# Patient Record
Sex: Female | Born: 1935
Health system: Southern US, Community
[De-identification: ages and names within clinical notes are randomized; demographics above are authoritative.]

## PROBLEM LIST (undated history)

## (undated) DIAGNOSIS — I2510 Atherosclerotic heart disease of native coronary artery without angina pectoris: Secondary | ICD-10-CM

## (undated) HISTORY — DX: Atherosclerotic heart disease of native coronary artery without angina pectoris: I25.10

---

## 2007-02-10 HISTORY — PX: CORONARY ARTERY BYPASS GRAFT: SHX141

## 2007-12-27 ENCOUNTER — Inpatient Hospital Stay: Payer: Self-pay | Admitting: Internal Medicine

## 2010-09-22 ENCOUNTER — Inpatient Hospital Stay: Payer: Self-pay | Admitting: Internal Medicine

## 2011-06-22 ENCOUNTER — Ambulatory Visit (INDEPENDENT_AMBULATORY_CARE_PROVIDER_SITE_OTHER): Payer: Medicare Other | Admitting: Internal Medicine

## 2011-06-22 ENCOUNTER — Encounter: Payer: Self-pay | Admitting: Internal Medicine

## 2011-06-22 VITALS — BP 179/64 | HR 58 | Temp 98.0°F | Resp 14 | Ht 60.0 in | Wt 98.0 lb

## 2011-06-22 DIAGNOSIS — D649 Anemia, unspecified: Secondary | ICD-10-CM

## 2011-06-22 DIAGNOSIS — I2510 Atherosclerotic heart disease of native coronary artery without angina pectoris: Secondary | ICD-10-CM | POA: Insufficient documentation

## 2011-06-22 NOTE — Progress Notes (Signed)
Subjective:    Patient ID: Madison Brown, female    DOB: March 21, 1929, 78 y.o.   MRN: [id number]  HPI 78 year old female with history of coronary artery disease presents to establish care. She reports that she is generally feeling well. She has no complaints today. In regards to her coronary artery disease, she reports that she underwent coronary artery bypass grafting in 2009. She continues to have some discomfort over her mid sternum at the incision site. However, she denies any chest pain, shortness of breath, palpitations. She reports full compliance with her medications. She reports that her cardiologist had stopped her statin medication because of her age. She reports that she follows a healthy diet which is low in saturated fat and high in fiber. She also exercises regularly by walking 2 miles per day.  Outpatient Encounter Prescriptions as of 06/22/2011  Medication Sig Dispense Refill  . AMLODIPINE BESYLATE PO Take 10 mg by mouth daily.      . clopidogrel (PLAVIX) 75 MG tablet Take 75 mg by mouth daily.      . metoprolol (LOPRESSOR) 50 MG tablet Take 50 mg by mouth 2 (two) times daily.        Review of Systems  Constitutional: Negative for fever, chills, appetite change, fatigue and unexpected weight change.  HENT: Negative for ear pain, congestion, sore throat, trouble swallowing, neck pain, voice change and sinus pressure.   Eyes: Negative for visual disturbance.  Respiratory: Negative for cough, shortness of breath, wheezing and stridor.   Cardiovascular: Negative for chest pain, palpitations and leg swelling.  Gastrointestinal: Negative for nausea, vomiting, abdominal pain, diarrhea, constipation, blood in stool, abdominal distention and anal bleeding.  Genitourinary: Negative for dysuria and flank pain.  Musculoskeletal: Negative for myalgias, arthralgias and gait problem.  Skin: Negative for color change and rash.  Neurological: Negative for dizziness and headaches.  Hematological:  Negative for adenopathy. Does not bruise/bleed easily.  Psychiatric/Behavioral: Negative for suicidal ideas, sleep disturbance and dysphoric mood. The patient is not nervous/anxious.    BP 179/64  Pulse 58  Temp(Src) 98 F (36.7 C) (Oral)  Resp 14  Ht 5' (1.524 m)  Wt 98 lb (44.453 kg)  BMI 19.14 kg/m2  SpO2 96%     Objective:   Physical Exam  Constitutional: She is oriented to person, place, and time. She appears well-developed and well-nourished. No distress.  HENT:  Head: Normocephalic and atraumatic.  Right Ear: External ear normal.  Left Ear: External ear normal.  Nose: Nose normal.  Mouth/Throat: Oropharynx is clear and moist. No oropharyngeal exudate.  Eyes: Conjunctivae are normal. Pupils are equal, round, and reactive to light. Right eye exhibits no discharge. Left eye exhibits no discharge. No scleral icterus.  Neck: Normal range of motion. Neck supple. No tracheal deviation present. No thyromegaly present.  Cardiovascular: Normal rate, regular rhythm, normal heart sounds and intact distal pulses.  Exam reveals no gallop and no friction rub.   No murmur heard. Pulmonary/Chest: Effort normal and breath sounds normal. No respiratory distress. She has no wheezes. She has no rales. She exhibits no tenderness.    Abdominal: Soft. Bowel sounds are normal. She exhibits no distension and no mass. There is no tenderness. There is no guarding.  Musculoskeletal: Normal range of motion. She exhibits no edema and no tenderness.  Lymphadenopathy:    She has no cervical adenopathy.  Neurological: She is alert and oriented to person, place, and time. No cranial nerve deficit. She exhibits normal muscle tone. Coordination normal.  Skin: Skin is warm and dry. No rash noted. She is not diaphoretic. No erythema. No pallor.  Psychiatric: She has a normal mood and affect. Her behavior is normal. Judgment and thought content normal.          Assessment & Plan:

## 2011-06-22 NOTE — Assessment & Plan Note (Signed)
Symptomatically doing well. Will obtain previous records from [facility] and her local cardiologist. Will continue current medications including metoprolol, amlodipine, and Plavix. Will review records to see why patient is not currently taking aspirin or statin medication. Will check CMP and lipids with labs today. Followup in 6 months or as needed.

## 2011-06-23 LAB — CBC WITH DIFFERENTIAL/PLATELET
Basophils Absolute: 0.1 10*3/uL (ref 0.0–0.1)
Eosinophils Absolute: 0.2 10*3/uL (ref 0.0–0.7)
HCT: 36.3 % (ref 36.0–46.0)
Hemoglobin: 12 g/dL (ref 12.0–15.0)
Lymphocytes Relative: 25.8 % (ref 12.0–46.0)
Lymphs Abs: 1.8 10*3/uL (ref 0.7–4.0)
MCHC: 33 g/dL (ref 30.0–36.0)
MCV: 93.7 fl (ref 78.0–100.0)
Monocytes Absolute: 0.6 10*3/uL (ref 0.1–1.0)
Neutro Abs: 4.3 10*3/uL (ref 1.4–7.7)
RDW: 13.3 % (ref 11.5–14.6)

## 2011-06-23 LAB — LIPID PANEL
Cholesterol: 204 mg/dL — ABNORMAL HIGH (ref 0–200)
VLDL: 23.4 mg/dL (ref 0.0–40.0)

## 2011-06-23 LAB — COMPREHENSIVE METABOLIC PANEL
CO2: 27 mEq/L (ref 19–32)
Calcium: 9.3 mg/dL (ref 8.4–10.5)
Creatinine, Ser: 1.2 mg/dL (ref 0.4–1.2)
GFR: 45.92 mL/min — ABNORMAL LOW (ref >60.00)
Glucose, Bld: 108 mg/dL — ABNORMAL HIGH (ref 70–99)
Sodium: 141 mEq/L (ref 135–145)
Total Bilirubin: 0.3 mg/dL (ref 0.3–1.2)
Total Protein: 8 g/dL (ref 6.0–8.3)

## 2011-12-23 ENCOUNTER — Ambulatory Visit: Payer: Medicare Other | Admitting: Internal Medicine

## 2011-12-31 ENCOUNTER — Ambulatory Visit (INDEPENDENT_AMBULATORY_CARE_PROVIDER_SITE_OTHER): Payer: Medicare Other | Admitting: Internal Medicine

## 2011-12-31 ENCOUNTER — Encounter: Payer: Self-pay | Admitting: Internal Medicine

## 2011-12-31 VITALS — BP 138/70 | HR 60 | Temp 97.9°F | Resp 16 | Wt 99.2 lb

## 2011-12-31 DIAGNOSIS — D649 Anemia, unspecified: Secondary | ICD-10-CM

## 2011-12-31 DIAGNOSIS — Z888 Allergy status to other drugs, medicaments and biological substances status: Secondary | ICD-10-CM

## 2011-12-31 DIAGNOSIS — Z139 Encounter for screening, unspecified: Secondary | ICD-10-CM

## 2011-12-31 DIAGNOSIS — Z789 Other specified health status: Secondary | ICD-10-CM | POA: Insufficient documentation

## 2011-12-31 DIAGNOSIS — E039 Hypothyroidism, unspecified: Secondary | ICD-10-CM

## 2011-12-31 DIAGNOSIS — I2510 Atherosclerotic heart disease of native coronary artery without angina pectoris: Secondary | ICD-10-CM

## 2011-12-31 LAB — COMPREHENSIVE METABOLIC PANEL
AST: 26 U/L (ref 0–37)
BUN: 20 mg/dL (ref 6–23)
Calcium: 9.3 mg/dL (ref 8.4–10.5)
Chloride: 101 mEq/L (ref 96–112)
Creatinine, Ser: 1.2 mg/dL (ref 0.4–1.2)
GFR: 44.99 mL/min — ABNORMAL LOW (ref >60.00)

## 2011-12-31 LAB — CBC WITH DIFFERENTIAL/PLATELET
Basophils Relative: 0.3 % (ref 0.0–3.0)
Hemoglobin: 12.7 g/dL (ref 12.0–15.0)
Lymphocytes Relative: 26 % (ref 12.0–46.0)
MCHC: 33.5 g/dL (ref 30.0–36.0)
Monocytes Relative: 8.6 % (ref 3.0–12.0)
Neutro Abs: 4.8 10*3/uL (ref 1.4–7.7)
RBC: 4.07 Mil/uL (ref 3.87–5.11)

## 2011-12-31 LAB — POCT URINALYSIS DIPSTICK
Leukocytes, UA: NEGATIVE
Protein, UA: NEGATIVE
Urobilinogen, UA: 0.2

## 2011-12-31 LAB — LIPID PANEL
HDL: 44.7 mg/dL (ref >39.00)
Total CHOL/HDL Ratio: 5
VLDL: 27 mg/dL (ref 0.0–40.0)

## 2011-12-31 LAB — LDL CHOLESTEROL, DIRECT: Direct LDL: 135.5 mg/dL

## 2011-12-31 NOTE — Assessment & Plan Note (Signed)
Symptomatically doing well with no recent  Chest pain or dyspnea. Encouraged  her to continue efforts at regular physical activity including walking 2 miles daily.We'll continue current medications. Will get recent notes from her cardiologist. Followup in 6 months or sooner as needed.

## 2011-12-31 NOTE — Progress Notes (Signed)
  Subjective:    Patient ID: Madison Brown, female    DOB: 11-18-1929, 78 y.o.   MRN: [id number]  HPI 78 year old female with history of coronary artery disease status post CABG presents for followup. She reports she is generally feeling well. She continues to be very active, walking 2 miles per day. She notes some discomfort at the incision site from her CABG which has been chronic. She denies any chest pain, shortness of breath, nausea, diaphoresis. She reports normal energy level. She denies new concerns today.  Outpatient Encounter Prescriptions as of 12/31/2011  Medication Sig Dispense Refill  . AMLODIPINE BESYLATE PO Take 10 mg by mouth daily.      . clopidogrel (PLAVIX) 75 MG tablet Take 75 mg by mouth daily.      . metoprolol (LOPRESSOR) 50 MG tablet Take 50 mg by mouth 2 (two) times daily.       BP 138/70  Pulse 60  Temp 97.9 F (36.6 C) (Oral)  Resp 16  Wt 99 lb 4 oz (45.02 kg)  SpO2 98%  Review of Systems  Constitutional: Negative for fever, chills, appetite change, fatigue and unexpected weight change.  HENT: Negative for ear pain, congestion, sore throat, trouble swallowing, neck pain, voice change and sinus pressure.   Eyes: Negative for visual disturbance.  Respiratory: Negative for cough, shortness of breath, wheezing and stridor.   Cardiovascular: Negative for chest pain, palpitations and leg swelling.  Gastrointestinal: Negative for nausea, vomiting, abdominal pain, diarrhea, constipation, blood in stool, abdominal distention and anal bleeding.  Genitourinary: Negative for dysuria and flank pain.  Musculoskeletal: Negative for myalgias, arthralgias and gait problem.  Skin: Negative for color change and rash.  Neurological: Negative for dizziness and headaches.  Hematological: Negative for adenopathy. Does not bruise/bleed easily.  Psychiatric/Behavioral: Negative for suicidal ideas, sleep disturbance and dysphoric mood. The patient is not nervous/anxious.          Objective:   Physical Exam  Constitutional: She is oriented to person, place, and time. She appears well-developed and well-nourished. No distress.  HENT:  Head: Normocephalic and atraumatic.  Right Ear: External ear normal.  Left Ear: External ear normal.  Nose: Nose normal.  Mouth/Throat: Oropharynx is clear and moist. No oropharyngeal exudate.  Eyes: Conjunctivae normal are normal. Pupils are equal, round, and reactive to light. Right eye exhibits no discharge. Left eye exhibits no discharge. No scleral icterus.  Neck: Normal range of motion. Neck supple. No tracheal deviation present. No thyromegaly present.  Cardiovascular: Normal rate, regular rhythm, normal heart sounds and intact distal pulses.  Exam reveals no gallop and no friction rub.   No murmur heard. Pulmonary/Chest: Effort normal and breath sounds normal. No respiratory distress. She has no wheezes. She has no rales. She exhibits no tenderness.  Abdominal: Soft. Bowel sounds are normal. She exhibits no distension and no mass. There is no tenderness. There is no rebound and no guarding.  Musculoskeletal: Normal range of motion. She exhibits no edema and no tenderness.  Lymphadenopathy:    She has no cervical adenopathy.  Neurological: She is alert and oriented to person, place, and time. No cranial nerve deficit. She exhibits normal muscle tone. Coordination normal.  Skin: Skin is warm and dry. No rash noted. She is not diaphoretic. No erythema. No pallor.  Psychiatric: She has a normal mood and affect. Her behavior is normal. Judgment and thought content normal.          Assessment & Plan:

## 2011-12-31 NOTE — Assessment & Plan Note (Signed)
Note that patient was taken off statin medications by her cardiologist because of memory loss on these medications.

## 2012-10-06 ENCOUNTER — Ambulatory Visit (INDEPENDENT_AMBULATORY_CARE_PROVIDER_SITE_OTHER): Payer: Self-pay | Admitting: Internal Medicine

## 2012-10-06 ENCOUNTER — Encounter: Payer: Self-pay | Admitting: Internal Medicine

## 2012-10-06 VITALS — BP 128/78 | HR 59 | Temp 98.3°F | Ht 60.0 in | Wt 96.0 lb

## 2012-10-06 DIAGNOSIS — Z0000 Encounter for general adult medical examination without abnormal findings: Secondary | ICD-10-CM

## 2012-10-06 DIAGNOSIS — I2510 Atherosclerotic heart disease of native coronary artery without angina pectoris: Secondary | ICD-10-CM

## 2012-10-06 LAB — MICROALBUMIN / CREATININE URINE RATIO
Creatinine,U: 124.9 mg/dL
Microalb Creat Ratio: 1.8 mg/g (ref 0.0–30.0)

## 2012-10-06 NOTE — Assessment & Plan Note (Signed)
Symptomatically doing well. Encouraged continued healthy diet and regular exercise with walking. Continue current medications. Follow up with Dr. Gwen Pounds in 11/2012.

## 2012-10-06 NOTE — Assessment & Plan Note (Signed)
General medical exam normal today including breast exam. PAP and pelvic deferred because of age and pt preference. Pt declines colonoscopy and bone density testing and mammogram. Encouraged continued healthy diet and regular exercise with walking. Will check CMP, lipids, urine microalbumin today. She has follow up with her cardiologist, Dr. Gwen Pounds, in October 2014.

## 2012-10-06 NOTE — Progress Notes (Signed)
Subjective:    Patient ID: Madison Brown, female    DOB: November 03, 1929, 79 y.o.   MRN: [id number]  HPI The patient is here for annual Medicare wellness examination and management of other chronic and acute problems.   The risk factors are reflected in the social history.  The roster of all physicians providing medical care to patient - is listed in the Snapshot section of the chart.  Activities of daily living:  The patient is 100% independent in all ADLs: dressing, toileting, feeding as well as independent mobility  Home safety : The patient has smoke detectors in the home. They wear seatbelts.  There are no firearms at home. There is no violence in the home.   There is no risks for hepatitis, STDs or HIV. There is no history of blood transfusion (unsure during CABG). They have no travel history to infectious disease endemic areas of the world.  The patient has not seen their dentist in the last six month. Planning to set this up. They have seen their eye doctor in the last year. Followed by Dr. Alvester Morin. No issues with hearing.  They have deferred audiologic testing in the last year.   They do not  have excessive sun exposure. Discussed the need for sun protection: hats, long sleeves and use of sunscreen if there is significant sun exposure. No dermatologist.  Diet: the importance of a healthy diet is discussed. They do have a healthy diet.  The benefits of regular aerobic exercise were discussed. She walks 2 miles daily.  Depression screen: there are no signs or vegative symptoms of depression- irritability, change in appetite, anhedonia, sadness/tearfullness.  Cognitive assessment: the patient manages all their financial and personal affairs and is actively engaged. They could relate day,date,year and events.   The following portions of the patient's history were reviewed and updated as appropriate: allergies, current medications, past family history, past medical history,  past surgical  history, past social history  and problem list.  Visual acuity was not assessed per patient preference since she has regular follow up with her ophthalmologist. Hearing and body mass index were assessed and reviewed.   During the course of the visit the patient was educated and counseled about appropriate screening and preventive services including : fall prevention , diabetes screening, nutrition counseling, colorectal cancer screening, and recommended immunizations.    Outpatient Encounter Prescriptions as of 10/06/2012  Medication Sig Dispense Refill  . AMLODIPINE BESYLATE PO Take 10 mg by mouth daily.      . clopidogrel (PLAVIX) 75 MG tablet Take 75 mg by mouth daily.      . metoprolol tartrate (LOPRESSOR) 25 MG tablet Take 25 mg by mouth 2 (two) times daily.      . pravastatin (PRAVACHOL) 20 MG tablet Take 20 mg by mouth daily.       . metoprolol (LOPRESSOR) 50 MG tablet Take 50 mg by mouth 2 (two) times daily.       No facility-administered encounter medications on file as of 10/06/2012.   BP 160/80  Pulse 59  Temp(Src) 98.3 F (36.8 C) (Oral)  Ht 5' (1.524 m)  Wt 96 lb (43.545 kg)  BMI 18.75 kg/m2  SpO2 98%   Review of Systems  Constitutional: Negative for fever, chills, appetite change, fatigue and unexpected weight change.  HENT: Negative for ear pain, congestion, sore throat, trouble swallowing, neck pain, voice change and sinus pressure.   Eyes: Negative for visual disturbance.  Respiratory: Negative for cough, shortness of breath,  wheezing and stridor.   Cardiovascular: Negative for chest pain, palpitations and leg swelling.  Gastrointestinal: Negative for nausea, vomiting, abdominal pain, diarrhea, constipation, blood in stool, abdominal distention and anal bleeding.  Genitourinary: Negative for dysuria and flank pain.  Musculoskeletal: Negative for myalgias, arthralgias and gait problem.  Skin: Negative for color change and rash.  Neurological: Negative for dizziness  and headaches.  Hematological: Negative for adenopathy. Does not bruise/bleed easily.  Psychiatric/Behavioral: Negative for suicidal ideas, sleep disturbance and dysphoric mood. The patient is not nervous/anxious.        Objective:   Physical Exam  Constitutional: She is oriented to person, place, and time. She appears well-developed and well-nourished. No distress.  HENT:  Head: Normocephalic and atraumatic.  Right Ear: External ear normal.  Left Ear: External ear normal.  Nose: Nose normal.  Mouth/Throat: Oropharynx is clear and moist. No oropharyngeal exudate.  Eyes: Conjunctivae are normal. Pupils are equal, round, and reactive to light. Right eye exhibits no discharge. Left eye exhibits no discharge. No scleral icterus.  Neck: Normal range of motion. Neck supple. No tracheal deviation present. No thyromegaly present.  Cardiovascular: Normal rate, regular rhythm, normal heart sounds and intact distal pulses.  Exam reveals no gallop and no friction rub.   No murmur heard. Pulmonary/Chest: Effort normal and breath sounds normal. No accessory muscle usage. Not tachypneic. No respiratory distress. She has no decreased breath sounds. She has no wheezes. She has no rhonchi. She has no rales. She exhibits no tenderness. Right breast exhibits no inverted nipple, no mass, no nipple discharge, no skin change and no tenderness. Left breast exhibits no inverted nipple, no mass, no nipple discharge, no skin change and no tenderness. Breasts are symmetrical.    Musculoskeletal: Normal range of motion. She exhibits no edema and no tenderness.  Lymphadenopathy:    She has no cervical adenopathy.  Neurological: She is alert and oriented to person, place, and time. No cranial nerve deficit. She exhibits normal muscle tone. Coordination normal.  Skin: Skin is warm and dry. No rash noted. She is not diaphoretic. No erythema. No pallor.  Psychiatric: She has a normal mood and affect. Her behavior is  normal. Judgment and thought content normal.          Assessment & Plan:

## 2012-10-07 ENCOUNTER — Encounter: Payer: Self-pay | Admitting: *Deleted

## 2012-10-07 LAB — COMPREHENSIVE METABOLIC PANEL
ALT: 16 U/L (ref 0–35)
AST: 23 U/L (ref 0–37)
Creatinine, Ser: 1.2 mg/dL (ref 0.4–1.2)
GFR: 44.49 mL/min — ABNORMAL LOW (ref >60.00)
Total Bilirubin: 0.5 mg/dL (ref 0.3–1.2)

## 2012-10-07 LAB — LIPID PANEL
Cholesterol: 169 mg/dL (ref 0–200)
HDL: 51.9 mg/dL (ref >39.00)
VLDL: 19.8 mg/dL (ref 0.0–40.0)

## 2013-03-30 ENCOUNTER — Encounter: Payer: Self-pay | Admitting: Internal Medicine

## 2013-03-30 ENCOUNTER — Encounter (INDEPENDENT_AMBULATORY_CARE_PROVIDER_SITE_OTHER): Payer: Self-pay

## 2013-03-30 ENCOUNTER — Ambulatory Visit (INDEPENDENT_AMBULATORY_CARE_PROVIDER_SITE_OTHER): Payer: Medicare Other | Admitting: Internal Medicine

## 2013-03-30 VITALS — BP 110/68 | HR 60 | Temp 98.1°F | Wt 99.0 lb

## 2013-03-30 DIAGNOSIS — E785 Hyperlipidemia, unspecified: Secondary | ICD-10-CM

## 2013-03-30 DIAGNOSIS — I10 Essential (primary) hypertension: Secondary | ICD-10-CM

## 2013-03-30 DIAGNOSIS — I2510 Atherosclerotic heart disease of native coronary artery without angina pectoris: Secondary | ICD-10-CM

## 2013-03-30 LAB — COMPREHENSIVE METABOLIC PANEL
ALBUMIN: 4.1 g/dL (ref 3.5–5.2)
ALT: 15 U/L (ref 0–35)
AST: 22 U/L (ref 0–37)
Alkaline Phosphatase: 70 U/L (ref 39–117)
BUN: 16 mg/dL (ref 6–23)
CO2: 28 meq/L (ref 19–32)
Calcium: 9.4 mg/dL (ref 8.4–10.5)
Chloride: 102 mEq/L (ref 96–112)
Creatinine, Ser: 1.1 mg/dL (ref 0.4–1.2)
GFR: 49 mL/min — AB (ref >60.00)
GLUCOSE: 104 mg/dL — AB (ref 70–99)
POTASSIUM: 4.2 meq/L (ref 3.5–5.1)
Sodium: 138 mEq/L (ref 135–145)
Total Bilirubin: 0.5 mg/dL (ref 0.3–1.2)
Total Protein: 7.9 g/dL (ref 6.0–8.3)

## 2013-03-30 LAB — LIPID PANEL
CHOLESTEROL: 155 mg/dL (ref 0–200)
HDL: 50.8 mg/dL (ref >39.00)
LDL Cholesterol: 85 mg/dL (ref 0–99)
Total CHOL/HDL Ratio: 3
Triglycerides: 97 mg/dL (ref 0.0–149.0)
VLDL: 19.4 mg/dL (ref 0.0–40.0)

## 2013-03-30 NOTE — Progress Notes (Signed)
Subjective:    Patient ID: Madison Brown, female    DOB: May 06, 1929, 80 y.o.   MRN: [id number]  HPI 80YO female with CAD, HTN, HL presents for follow up. Doing well. No concerns today. Notes some occasional aching pain at site of CABG incision. This has been chronic for her. She does not take any medication for this. Otherwise, feeling well. Walking on a regular basis. No dyspnea, or other chest pain.  Review of Systems  Constitutional: Negative for fever, chills, appetite change, fatigue and unexpected weight change.  HENT: Negative for congestion, ear pain, sinus pressure, sore throat, trouble swallowing and voice change.   Eyes: Negative for visual disturbance.  Respiratory: Negative for cough, shortness of breath, wheezing and stridor.   Cardiovascular: Positive for chest pain (anterior chest wall). Negative for palpitations and leg swelling.  Gastrointestinal: Negative for nausea, vomiting, abdominal pain, diarrhea, constipation, blood in stool, abdominal distention and anal bleeding.  Genitourinary: Negative for dysuria and flank pain.  Musculoskeletal: Negative for arthralgias, gait problem, myalgias and neck pain.  Skin: Negative for color change and rash.  Neurological: Negative for dizziness and headaches.  Hematological: Negative for adenopathy. Does not bruise/bleed easily.  Psychiatric/Behavioral: Negative for suicidal ideas, sleep disturbance and dysphoric mood. The patient is not nervous/anxious.        Objective:    BP 110/68  Pulse 60  Temp(Src) 98.1 F (36.7 C) (Oral)  Wt 99 lb (44.906 kg)  SpO2 97% Physical Exam  Constitutional: She is oriented to person, place, and time. She appears well-developed and well-nourished. No distress.  HENT:  Head: Normocephalic and atraumatic.  Right Ear: External ear normal.  Left Ear: External ear normal.  Nose: Nose normal.  Mouth/Throat: Oropharynx is clear and moist. No oropharyngeal exudate.  Eyes: Conjunctivae are  normal. Pupils are equal, round, and reactive to light. Right eye exhibits no discharge. Left eye exhibits no discharge. No scleral icterus.  Neck: Normal range of motion. Neck supple. No tracheal deviation present. No thyromegaly present.  Cardiovascular: Normal rate, regular rhythm, normal heart sounds and intact distal pulses.  Exam reveals no gallop and no friction rub.   No murmur heard. Pulmonary/Chest: Effort normal and breath sounds normal. No accessory muscle usage. Not tachypneic. No respiratory distress. She has no decreased breath sounds. She has no wheezes. She has no rhonchi. She has no rales. She exhibits no tenderness.    Musculoskeletal: Normal range of motion. She exhibits no edema and no tenderness.  Lymphadenopathy:    She has no cervical adenopathy.  Neurological: She is alert and oriented to person, place, and time. No cranial nerve deficit. She exhibits normal muscle tone. Coordination normal.  Skin: Skin is warm and dry. No rash noted. She is not diaphoretic. No erythema. No pallor.  Psychiatric: She has a normal mood and affect. Her behavior is normal. Judgment and thought content normal.          Assessment & Plan:   Problem List Items Addressed This Visit   Coronary artery arteriosclerosis - Primary     Symptomatically doing well. Continue BB, Plavix, statin. Follow up with cardiologist in 04/2013.    Relevant Orders      Lipid panel      Comprehensive metabolic panel   Essential hypertension, benign      BP Readings from Last 3 Encounters:  03/30/13 110/68  10/06/12 128/78  12/31/11 138/70   BP well controlled on current medications. Will continue. Will check renal function with labs  today.    Other and unspecified hyperlipidemia     Tolerating Pravastatin well. Will continue. Will check lipids and LFTs with labs.        Return in about 6 months (around 09/27/2013) for Wellness Visit.

## 2013-03-30 NOTE — Assessment & Plan Note (Signed)
Symptomatically doing well. Continue BB, Plavix, statin. Follow up with cardiologist in 04/2013.

## 2013-03-30 NOTE — Assessment & Plan Note (Signed)
BP Readings from Last 3 Encounters:  03/30/13 110/68  10/06/12 128/78  12/31/11 138/70   BP well controlled on current medications. Will continue. Will check renal function with labs today.

## 2013-03-30 NOTE — Assessment & Plan Note (Signed)
Tolerating Pravastatin well. Will continue. Will check lipids and LFTs with labs.

## 2013-03-30 NOTE — Progress Notes (Signed)
Pre-visit discussion using our clinic review tool. No additional management support is needed unless otherwise documented below in the visit note.  

## 2013-03-31 ENCOUNTER — Telehealth: Payer: Self-pay | Admitting: Internal Medicine

## 2013-03-31 ENCOUNTER — Encounter: Payer: Self-pay | Admitting: *Deleted

## 2013-03-31 NOTE — Telephone Encounter (Signed)
Relevant patient education mailed to patient.  

## 2013-10-10 ENCOUNTER — Encounter: Payer: Self-pay | Admitting: *Deleted

## 2013-10-10 ENCOUNTER — Ambulatory Visit (INDEPENDENT_AMBULATORY_CARE_PROVIDER_SITE_OTHER): Payer: Medicare Other | Admitting: Internal Medicine

## 2013-10-10 ENCOUNTER — Encounter: Payer: Self-pay | Admitting: Internal Medicine

## 2013-10-10 VITALS — BP 160/74 | HR 80 | Temp 98.7°F | Ht 61.0 in | Wt 93.0 lb

## 2013-10-10 DIAGNOSIS — I10 Essential (primary) hypertension: Secondary | ICD-10-CM

## 2013-10-10 DIAGNOSIS — Z0000 Encounter for general adult medical examination without abnormal findings: Secondary | ICD-10-CM

## 2013-10-10 LAB — CBC WITH DIFFERENTIAL/PLATELET
Basophils Absolute: 0 10*3/uL (ref 0.0–0.1)
Basophils Relative: 0.6 % (ref 0.0–3.0)
EOS PCT: 1.7 % (ref 0.0–5.0)
Eosinophils Absolute: 0.1 10*3/uL (ref 0.0–0.7)
HCT: 36.7 % (ref 36.0–46.0)
Hemoglobin: 12.4 g/dL (ref 12.0–15.0)
LYMPHS PCT: 22.8 % (ref 12.0–46.0)
Lymphs Abs: 1.5 10*3/uL (ref 0.7–4.0)
MCHC: 33.7 g/dL (ref 30.0–36.0)
MCV: 92.6 fl (ref 78.0–100.0)
MONO ABS: 0.7 10*3/uL (ref 0.1–1.0)
MONOS PCT: 11.6 % (ref 3.0–12.0)
NEUTROS PCT: 63.3 % (ref 43.0–77.0)
Neutro Abs: 4 10*3/uL (ref 1.4–7.7)
PLATELETS: 147 10*3/uL — AB (ref 150.0–400.0)
RBC: 3.97 Mil/uL (ref 3.87–5.11)
RDW: 12.9 % (ref 11.5–15.5)
WBC: 6.4 10*3/uL (ref 4.0–10.5)

## 2013-10-10 LAB — COMPREHENSIVE METABOLIC PANEL
ALK PHOS: 70 U/L (ref 39–117)
ALT: 11 U/L (ref 0–35)
AST: 18 U/L (ref 0–37)
Albumin: 3.8 g/dL (ref 3.5–5.2)
BUN: 17 mg/dL (ref 6–23)
CALCIUM: 9.2 mg/dL (ref 8.4–10.5)
CO2: 30 meq/L (ref 19–32)
Chloride: 103 mEq/L (ref 96–112)
Creatinine, Ser: 1.3 mg/dL — ABNORMAL HIGH (ref 0.4–1.2)
GFR: 43.56 mL/min — ABNORMAL LOW (ref >60.00)
Glucose, Bld: 106 mg/dL — ABNORMAL HIGH (ref 70–99)
Potassium: 4.3 mEq/L (ref 3.5–5.1)
SODIUM: 139 meq/L (ref 135–145)
TOTAL PROTEIN: 7.4 g/dL (ref 6.0–8.3)
Total Bilirubin: 0.7 mg/dL (ref 0.2–1.2)

## 2013-10-10 LAB — LIPID PANEL
CHOLESTEROL: 155 mg/dL (ref 0–200)
HDL: 50.9 mg/dL (ref >39.00)
LDL CALC: 88 mg/dL (ref 0–99)
NonHDL: 104.1
Total CHOL/HDL Ratio: 3
Triglycerides: 80 mg/dL (ref 0.0–149.0)
VLDL: 16 mg/dL (ref 0.0–40.0)

## 2013-10-10 NOTE — Progress Notes (Signed)
Subjective:    Patient ID: Madison Brown, female    DOB: [birth year], 80 y.o.   MRN: [id number]  HPI The patient is here for annual Medicare wellness examination and management of other chronic and acute problems.   The risk factors are reflected in the social history.  The roster of all physicians providing medical care to patient - is listed in the Snapshot section of the chart.  Activities of daily living:  The patient is 100% independent in all ADLs: dressing, toileting, feeding as well as independent mobility. Lives with husband in 1 story home. No pets.  Home safety : The patient has smoke detectors in the home. They wear seatbelts.  There are no firearms at home. There is no violence in the home.   There is no risks for hepatitis, STDs or HIV. There is no history of blood transfusion (unsure during CABG). They have no travel history to infectious disease endemic areas of the world.  The patient has not seen their dentist in the last six month. Planning to set this up. They have seen their eye doctor in the last year. Followed by Dr. Alvester Morin. No issues with hearing.  They have deferred audiologic testing in the last year.   They do not  have excessive sun exposure. Discussed the need for sun protection: hats, long sleeves and use of sunscreen if there is significant sun exposure. No dermatologist. Cardiologist - Dr. Gwen Pounds  Diet: the importance of a healthy diet is discussed. They do have a healthy diet.  The benefits of regular aerobic exercise were discussed. She walks 2 miles daily.  Depression screen: there are no signs or vegative symptoms of depression- irritability, change in appetite, anhedonia, sadness/tearfullness.  Cognitive assessment: the patient manages all their financial and personal affairs and is actively engaged. They could relate day,date,year and events.   The following portions of the patient's history were reviewed and updated as appropriate: allergies,  current medications, past family history, past medical history,  past surgical history, past social history  and problem list.  Visual acuity was not assessed per patient preference since she has regular follow up with her ophthalmologist. Hearing and body mass index were assessed and reviewed.   During the course of the visit the patient was educated and counseled about appropriate screening and preventive services including : fall prevention , diabetes screening, nutrition counseling, colorectal cancer screening, and recommended immunizations.     Review of Systems  Constitutional: Negative for fever, chills, appetite change, fatigue and unexpected weight change.  Eyes: Negative for visual disturbance.  Respiratory: Negative for shortness of breath.   Cardiovascular: Negative for chest pain and leg swelling.  Gastrointestinal: Negative for nausea, vomiting, abdominal pain, diarrhea, constipation and abdominal distention.  Genitourinary: Negative for difficulty urinating.  Musculoskeletal: Negative for arthralgias, back pain and myalgias.  Skin: Negative for color change and rash.  Hematological: Negative for adenopathy. Does not bruise/bleed easily.  Psychiatric/Behavioral: Negative for confusion, sleep disturbance, dysphoric mood and decreased concentration. The patient is not nervous/anxious.        Objective:    BP 160/74  Pulse 80  Temp(Src) 98.7 F (37.1 C) (Oral)  Ht  (1.549 m)  Wt 93 lb (42.185 kg)  BMI 17.58 kg/m2  SpO2 97% Physical Exam  Constitutional: She is oriented to person, place, and time. She appears well-developed and well-nourished. No distress.  HENT:  Head: Normocephalic and atraumatic.  Right Ear: External ear normal.  Left Ear: External ear normal.  Nose: Nose normal.  Mouth/Throat: Oropharynx is clear and moist. No oropharyngeal exudate.  Eyes: Conjunctivae and EOM are normal. Pupils are equal, round, and reactive to light. Right eye exhibits no  discharge.  Neck: Normal range of motion. Neck supple. No thyromegaly present.  Cardiovascular: Normal rate, regular rhythm, normal heart sounds and intact distal pulses.  Exam reveals no gallop and no friction rub.   No murmur heard. Pulmonary/Chest: Effort normal. No respiratory distress. She has no wheezes. She has no rales.  Abdominal: Soft. Bowel sounds are normal. She exhibits no distension and no mass. There is no tenderness. There is no rebound and no guarding.  Musculoskeletal: Normal range of motion. She exhibits no edema and no tenderness.  Lymphadenopathy:    She has no cervical adenopathy.  Neurological: She is alert and oriented to person, place, and time. No cranial nerve deficit. Coordination normal.  Skin: Skin is warm and dry. No rash noted. She is not diaphoretic. No erythema. No pallor.  Psychiatric: She has a normal mood and affect. Her behavior is normal. Judgment and thought content normal.          Assessment & Plan:   Problem List Items Addressed This Visit     Unprioritized   Essential hypertension, benign      BP Readings from Last 3 Encounters:  10/10/13 160/74  03/30/13 110/68  10/06/12 128/78   BP elevated today. Reviewed notes from Cardiology. Pt was taken off Metoprolol. Will monitor for now and have pt follow up with cardiology as scheduled.    Medicare annual wellness visit, subsequent - Primary     General medical exam normal today. Breast exam, PAP and pelvic deferred because of age and pt preference. Pt declines colonoscopy and bone density testing and mammogram. Encouraged continued healthy diet and regular exercise with walking. Will check CMP, lipids. Follow up with cardiology as scheduled. Flu and pneumonia vaccine declined.      Relevant Orders      CBC with Differential      Comprehensive metabolic panel      Lipid panel       Return in about 1 year (around 10/11/2014) for Wellness Visit.

## 2013-10-10 NOTE — Assessment & Plan Note (Signed)
BP Readings from Last 3 Encounters:  10/10/13 160/74  03/30/13 110/68  10/06/12 128/78   BP elevated today. Reviewed notes from Cardiology. Pt was taken off Metoprolol. Will monitor for now and have pt follow up with cardiology as scheduled.

## 2013-10-10 NOTE — Patient Instructions (Signed)

## 2013-10-10 NOTE — Progress Notes (Signed)
Pre visit review using our clinic review tool, if applicable. No additional management support is needed unless otherwise documented below in the visit note. 

## 2013-10-10 NOTE — Assessment & Plan Note (Signed)
General medical exam normal today. Breast exam, PAP and pelvic deferred because of age and pt preference. Pt declines colonoscopy and bone density testing and mammogram. Encouraged continued healthy diet and regular exercise with walking. Will check CMP, lipids. Follow up with cardiology as scheduled. Flu and pneumonia vaccine declined.

## 2013-11-06 ENCOUNTER — Other Ambulatory Visit: Payer: Self-pay | Admitting: *Deleted

## 2013-11-06 ENCOUNTER — Telehealth: Payer: Self-pay | Admitting: *Deleted

## 2013-11-06 DIAGNOSIS — D696 Thrombocytopenia, unspecified: Secondary | ICD-10-CM

## 2013-11-06 NOTE — Telephone Encounter (Signed)
CBC for thrombocytopenia 

## 2013-11-06 NOTE — Telephone Encounter (Signed)
Pt is coming in tomorrow what labs and dx?  

## 2013-11-07 ENCOUNTER — Other Ambulatory Visit (INDEPENDENT_AMBULATORY_CARE_PROVIDER_SITE_OTHER): Payer: Medicare Other

## 2013-11-07 ENCOUNTER — Other Ambulatory Visit: Payer: Self-pay | Admitting: Internal Medicine

## 2013-11-07 DIAGNOSIS — D649 Anemia, unspecified: Secondary | ICD-10-CM

## 2013-11-07 DIAGNOSIS — D696 Thrombocytopenia, unspecified: Secondary | ICD-10-CM

## 2013-11-07 LAB — CBC WITH DIFFERENTIAL/PLATELET
BASOS PCT: 0.4 % (ref 0.0–3.0)
Basophils Absolute: 0 10*3/uL (ref 0.0–0.1)
Eosinophils Absolute: 0.1 10*3/uL (ref 0.0–0.7)
Eosinophils Relative: 1.6 % (ref 0.0–5.0)
HCT: 35.1 % — ABNORMAL LOW (ref 36.0–46.0)
Hemoglobin: 11.9 g/dL — ABNORMAL LOW (ref 12.0–15.0)
LYMPHS PCT: 22.4 % (ref 12.0–46.0)
Lymphs Abs: 1.5 10*3/uL (ref 0.7–4.0)
MCHC: 33.8 g/dL (ref 30.0–36.0)
MCV: 91.2 fl (ref 78.0–100.0)
MONO ABS: 0.7 10*3/uL (ref 0.1–1.0)
Monocytes Relative: 9.9 % (ref 3.0–12.0)
NEUTROS PCT: 65.7 % (ref 43.0–77.0)
Neutro Abs: 4.5 10*3/uL (ref 1.4–7.7)
Platelets: 140 10*3/uL — ABNORMAL LOW (ref 150.0–400.0)
RBC: 3.85 Mil/uL — AB (ref 3.87–5.11)
RDW: 12.8 % (ref 11.5–15.5)
WBC: 6.8 10*3/uL (ref 4.0–10.5)

## 2013-11-08 ENCOUNTER — Telehealth: Payer: Self-pay | Admitting: Internal Medicine

## 2013-11-08 NOTE — Telephone Encounter (Signed)
Spoke with daughter

## 2013-11-08 NOTE — Telephone Encounter (Signed)
Pt daughter stopped by to ask about mother's lab work. Pt thinks that she need to go to another md for lab work. Please call daughter with lab results and information about pt seeing another md for labs.msn

## 2013-11-23 ENCOUNTER — Ambulatory Visit: Payer: Self-pay | Admitting: Oncology

## 2013-11-23 LAB — CBC CANCER CENTER
BASOS ABS: 0.1 x10 3/mm (ref 0.0–0.1)
Basophil %: 0.7 %
Eosinophil #: 0 x10 3/mm (ref 0.0–0.7)
Eosinophil %: 0.6 %
HCT: 38.7 % (ref 35.0–47.0)
HGB: 12.9 g/dL (ref 12.0–16.0)
Lymphocyte #: 1 x10 3/mm (ref 1.0–3.6)
Lymphocyte %: 13.4 %
MCH: 30.9 pg (ref 26.0–34.0)
MCHC: 33.3 g/dL (ref 32.0–36.0)
MCV: 93 fL (ref 80–100)
MONOS PCT: 9.3 %
Monocyte #: 0.7 x10 3/mm (ref 0.2–0.9)
NEUTROS PCT: 76 %
Neutrophil #: 5.4 x10 3/mm (ref 1.4–6.5)
Platelet: 151 x10 3/mm (ref 150–440)
RBC: 4.17 10*6/uL (ref 3.80–5.20)
RDW: 12.7 % (ref 11.5–14.5)
WBC: 7.2 x10 3/mm (ref 3.6–11.0)

## 2013-11-23 LAB — IRON AND TIBC
IRON SATURATION: 26 %
Iron Bind.Cap.(Total): 251 ug/dL (ref 250–450)
Iron: 65 ug/dL (ref 50–170)
Unbound Iron-Bind.Cap.: 186 ug/dL

## 2013-11-23 LAB — FOLATE: Folic Acid: 27.8 ng/mL (ref 3.1–100.0)

## 2013-11-23 LAB — LACTATE DEHYDROGENASE: LDH: 205 U/L (ref 81–246)

## 2013-11-23 LAB — FERRITIN: Ferritin (ARMC): 83 ng/mL (ref 8–388)

## 2013-11-28 LAB — PROT IMMUNOELECTROPHORES(ARMC)

## 2013-12-10 ENCOUNTER — Ambulatory Visit: Payer: Self-pay | Admitting: Oncology

## 2014-01-01 ENCOUNTER — Encounter: Payer: Self-pay | Admitting: Oncology

## 2014-02-23 ENCOUNTER — Ambulatory Visit: Payer: Self-pay | Admitting: Oncology

## 2014-02-23 LAB — CBC CANCER CENTER
BASOS PCT: 0.7 %
Basophil #: 0.1 x10 3/mm (ref 0.0–0.1)
EOS PCT: 1.8 %
Eosinophil #: 0.2 x10 3/mm (ref 0.0–0.7)
HCT: 36.5 % (ref 35.0–47.0)
HGB: 12.1 g/dL (ref 12.0–16.0)
Lymphocyte #: 1.9 x10 3/mm (ref 1.0–3.6)
Lymphocyte %: 22.3 %
MCH: 30.3 pg (ref 26.0–34.0)
MCHC: 33.2 g/dL (ref 32.0–36.0)
MCV: 91 fL (ref 80–100)
MONO ABS: 0.8 x10 3/mm (ref 0.2–0.9)
Monocyte %: 9.5 %
NEUTROS ABS: 5.7 x10 3/mm (ref 1.4–6.5)
Neutrophil %: 65.7 %
Platelet: 142 x10 3/mm — ABNORMAL LOW (ref 150–440)
RBC: 4.01 10*6/uL (ref 3.80–5.20)
RDW: 12.8 % (ref 11.5–14.5)
WBC: 8.6 x10 3/mm (ref 3.6–11.0)

## 2014-03-12 ENCOUNTER — Ambulatory Visit: Payer: Self-pay | Admitting: Oncology

## 2015-04-24 DIAGNOSIS — I25810 Atherosclerosis of coronary artery bypass graft(s) without angina pectoris: Secondary | ICD-10-CM | POA: Diagnosis not present

## 2015-04-24 DIAGNOSIS — I340 Nonrheumatic mitral (valve) insufficiency: Secondary | ICD-10-CM | POA: Diagnosis not present

## 2015-04-24 DIAGNOSIS — I119 Hypertensive heart disease without heart failure: Secondary | ICD-10-CM | POA: Diagnosis not present

## 2015-04-24 DIAGNOSIS — R079 Chest pain, unspecified: Secondary | ICD-10-CM | POA: Diagnosis not present

## 2015-04-24 DIAGNOSIS — I10 Essential (primary) hypertension: Secondary | ICD-10-CM | POA: Diagnosis not present

## 2015-04-24 DIAGNOSIS — I350 Nonrheumatic aortic (valve) stenosis: Secondary | ICD-10-CM | POA: Diagnosis not present

## 2015-04-24 DIAGNOSIS — R002 Palpitations: Secondary | ICD-10-CM | POA: Diagnosis not present

## 2015-04-24 DIAGNOSIS — G453 Amaurosis fugax: Secondary | ICD-10-CM | POA: Diagnosis not present

## 2015-04-24 DIAGNOSIS — E782 Mixed hyperlipidemia: Secondary | ICD-10-CM | POA: Diagnosis not present

## 2015-05-09 DIAGNOSIS — Z961 Presence of intraocular lens: Secondary | ICD-10-CM | POA: Diagnosis not present

## 2015-10-23 DIAGNOSIS — I350 Nonrheumatic aortic (valve) stenosis: Secondary | ICD-10-CM | POA: Diagnosis not present

## 2015-10-23 DIAGNOSIS — I119 Hypertensive heart disease without heart failure: Secondary | ICD-10-CM | POA: Diagnosis not present

## 2015-10-23 DIAGNOSIS — I10 Essential (primary) hypertension: Secondary | ICD-10-CM | POA: Diagnosis not present

## 2015-10-23 DIAGNOSIS — I25810 Atherosclerosis of coronary artery bypass graft(s) without angina pectoris: Secondary | ICD-10-CM | POA: Diagnosis not present

## 2016-02-19 DIAGNOSIS — E782 Mixed hyperlipidemia: Secondary | ICD-10-CM | POA: Diagnosis not present

## 2016-04-13 ENCOUNTER — Telehealth: Payer: Self-pay | Admitting: Internal Medicine

## 2016-04-13 NOTE — Telephone Encounter (Signed)
As per Dr. Darrick Huntsmanullo ok to set patient up with her to establish care. Thank you!

## 2016-07-14 DIAGNOSIS — E782 Mixed hyperlipidemia: Secondary | ICD-10-CM | POA: Diagnosis not present

## 2016-07-14 DIAGNOSIS — I119 Hypertensive heart disease without heart failure: Secondary | ICD-10-CM | POA: Diagnosis not present

## 2016-07-14 DIAGNOSIS — I25810 Atherosclerosis of coronary artery bypass graft(s) without angina pectoris: Secondary | ICD-10-CM | POA: Diagnosis not present

## 2016-07-14 DIAGNOSIS — I10 Essential (primary) hypertension: Secondary | ICD-10-CM | POA: Diagnosis not present

## 2016-08-14 ENCOUNTER — Ambulatory Visit (INDEPENDENT_AMBULATORY_CARE_PROVIDER_SITE_OTHER): Payer: Medicare Other | Admitting: Internal Medicine

## 2016-08-14 ENCOUNTER — Ambulatory Visit: Payer: Medicare Other | Admitting: Internal Medicine

## 2016-08-14 ENCOUNTER — Encounter: Payer: Self-pay | Admitting: Internal Medicine

## 2016-08-14 VITALS — BP 174/76 | HR 73 | Temp 98.1°F | Resp 15 | Ht 61.0 in | Wt 89.6 lb

## 2016-08-14 DIAGNOSIS — Z0000 Encounter for general adult medical examination without abnormal findings: Secondary | ICD-10-CM

## 2016-08-14 DIAGNOSIS — D696 Thrombocytopenia, unspecified: Secondary | ICD-10-CM

## 2016-08-14 DIAGNOSIS — I10 Essential (primary) hypertension: Secondary | ICD-10-CM

## 2016-08-14 DIAGNOSIS — Z0001 Encounter for general adult medical examination with abnormal findings: Secondary | ICD-10-CM

## 2016-08-14 DIAGNOSIS — Z780 Asymptomatic menopausal state: Secondary | ICD-10-CM

## 2016-08-14 DIAGNOSIS — R634 Abnormal weight loss: Secondary | ICD-10-CM

## 2016-08-14 DIAGNOSIS — R8299 Other abnormal findings in urine: Secondary | ICD-10-CM | POA: Diagnosis not present

## 2016-08-14 DIAGNOSIS — E559 Vitamin D deficiency, unspecified: Secondary | ICD-10-CM | POA: Diagnosis not present

## 2016-08-14 DIAGNOSIS — E785 Hyperlipidemia, unspecified: Secondary | ICD-10-CM

## 2016-08-14 DIAGNOSIS — R82998 Other abnormal findings in urine: Secondary | ICD-10-CM

## 2016-08-14 LAB — LIPID PANEL
CHOL/HDL RATIO: 3
Cholesterol: 187 mg/dL (ref 0–200)
HDL: 59.6 mg/dL (ref >39.00)
LDL CALC: 109 mg/dL — AB (ref 0–99)
NonHDL: 127.63
TRIGLYCERIDES: 95 mg/dL (ref 0.0–149.0)
VLDL: 19 mg/dL (ref 0.0–40.0)

## 2016-08-14 LAB — CBC WITH DIFFERENTIAL/PLATELET
BASOS PCT: 0.7 % (ref 0.0–3.0)
Basophils Absolute: 0.1 10*3/uL (ref 0.0–0.1)
EOS ABS: 0.2 10*3/uL (ref 0.0–0.7)
Eosinophils Relative: 1.8 % (ref 0.0–5.0)
HCT: 38.6 % (ref 36.0–46.0)
Hemoglobin: 13.1 g/dL (ref 12.0–15.0)
LYMPHS ABS: 1.5 10*3/uL (ref 0.7–4.0)
Lymphocytes Relative: 18.1 % (ref 12.0–46.0)
MCHC: 33.9 g/dL (ref 30.0–36.0)
MCV: 92.1 fl (ref 78.0–100.0)
MONO ABS: 0.8 10*3/uL (ref 0.1–1.0)
Monocytes Relative: 10.1 % (ref 3.0–12.0)
NEUTROS ABS: 5.7 10*3/uL (ref 1.4–7.7)
NEUTROS PCT: 69.3 % (ref 43.0–77.0)
PLATELETS: 161 10*3/uL (ref 150.0–400.0)
RBC: 4.2 Mil/uL (ref 3.87–5.11)
RDW: 12.8 % (ref 11.5–15.5)
WBC: 8.2 10*3/uL (ref 4.0–10.5)

## 2016-08-14 LAB — COMPREHENSIVE METABOLIC PANEL
ALT: 11 U/L (ref 0–35)
AST: 17 U/L (ref 0–37)
Albumin: 4.4 g/dL (ref 3.5–5.2)
Alkaline Phosphatase: 81 U/L (ref 39–117)
BUN: 19 mg/dL (ref 6–23)
CALCIUM: 9.9 mg/dL (ref 8.4–10.5)
CHLORIDE: 100 meq/L (ref 96–112)
CO2: 33 meq/L — AB (ref 19–32)
CREATININE: 1.21 mg/dL — AB (ref 0.40–1.20)
GFR: 44.91 mL/min — AB (ref >60.00)
Glucose, Bld: 107 mg/dL — ABNORMAL HIGH (ref 70–99)
POTASSIUM: 4.2 meq/L (ref 3.5–5.1)
Sodium: 141 mEq/L (ref 135–145)
Total Bilirubin: 0.6 mg/dL (ref 0.2–1.2)
Total Protein: 8 g/dL (ref 6.0–8.3)

## 2016-08-14 LAB — URINALYSIS, ROUTINE W REFLEX MICROSCOPIC
BILIRUBIN URINE: NEGATIVE
HGB URINE DIPSTICK: NEGATIVE
KETONES UR: NEGATIVE
LEUKOCYTES UA: NEGATIVE
Nitrite: NEGATIVE
RBC / HPF: NONE SEEN (ref 0–?)
SPECIFIC GRAVITY, URINE: 1.01 (ref 1.000–1.030)
Total Protein, Urine: NEGATIVE
UROBILINOGEN UA: 0.2 (ref 0.0–1.0)
Urine Glucose: NEGATIVE
WBC UA: NONE SEEN (ref 0–?)
pH: 6.5 (ref 5.0–8.0)

## 2016-08-14 LAB — VITAMIN D 25 HYDROXY (VIT D DEFICIENCY, FRACTURES): VITD: 39.37 ng/mL (ref 30.00–100.00)

## 2016-08-14 LAB — TSH: TSH: 2.98 u[IU]/mL (ref 0.35–4.50)

## 2016-08-14 NOTE — Patient Instructions (Signed)
It was a pleasure to meet you!  I see where Madison Brown gets her good looks!!  You need to increase your water intake.    I am ordering a bone density test to make sure you do not have osteoporosis    Please check your blood pressure  5 times AT REST at home over the next month  and send me the readings so I can determine if you need a change in medication    Health Maintenance for Postmenopausal Women Menopause is a normal process in which your reproductive ability comes to an end. This process happens gradually over a span of months to years, usually between the ages of 78 and 66. Menopause is complete when you have missed 12 consecutive menstrual periods. It is important to talk with your health care provider about some of the most common conditions that affect postmenopausal women, such as heart disease, cancer, and bone loss (osteoporosis). Adopting a healthy lifestyle and getting preventive care can help to promote your health and wellness. Those actions can also lower your chances of developing some of these common conditions. What should I know about menopause? During menopause, you may experience a number of symptoms, such as:  Moderate-to-severe hot flashes.  Night sweats.  Decrease in sex drive.  Mood swings.  Headaches.  Tiredness.  Irritability.  Memory problems.  Insomnia.  Choosing to treat or not to treat menopausal changes is an individual decision that you make with your health care provider. What should I know about hormone replacement therapy and supplements? Hormone therapy products are effective for treating symptoms that are associated with menopause, such as hot flashes and night sweats. Hormone replacement carries certain risks, especially as you become older. If you are thinking about using estrogen or estrogen with progestin treatments, discuss the benefits and risks with your health care provider. What should I know about heart disease and stroke? Heart  disease, heart attack, and stroke become more likely as you age. This may be due, in part, to the hormonal changes that your body experiences during menopause. These can affect how your body processes dietary fats, triglycerides, and cholesterol. Heart attack and stroke are both medical emergencies. There are many things that you can do to help prevent heart disease and stroke:  Have your blood pressure checked at least every 1-2 years. High blood pressure causes heart disease and increases the risk of stroke.  If you are 38-51 years old, ask your health care provider if you should take aspirin to prevent a heart attack or a stroke.  Do not use any tobacco products, including cigarettes, chewing tobacco, or electronic cigarettes. If you need help quitting, ask your health care provider.  It is important to eat a healthy diet and maintain a healthy weight. ? Be sure to include plenty of vegetables, fruits, low-fat dairy products, and lean protein. ? Avoid eating foods that are high in solid fats, added sugars, or salt (sodium).  Get regular exercise. This is one of the most important things that you can do for your health. ? Try to exercise for at least 150 minutes each week. The type of exercise that you do should increase your heart rate and make you sweat. This is known as moderate-intensity exercise. ? Try to do strengthening exercises at least twice each week. Do these in addition to the moderate-intensity exercise.  Know your numbers.Ask your health care provider to check your cholesterol and your blood glucose. Continue to have your blood tested as  directed by your health care provider.  What should I know about cancer screening? There are several types of cancer. Take the following steps to reduce your risk and to catch any cancer development as early as possible. Breast Cancer  Practice breast self-awareness. ? This means understanding how your breasts normally appear and feel. ? It  also means doing regular breast self-exams. Let your health care provider know about any changes, no matter how small.  If you are 40 or older, have a clinician do a breast exam (clinical breast exam or CBE) every year. Depending on your age, family history, and medical history, it may be recommended that you also have a yearly breast X-ray (mammogram).  If you have a family history of breast cancer, talk with your health care provider about genetic screening.  If you are at high risk for breast cancer, talk with your health care provider about having an MRI and a mammogram every year.  Breast cancer (BRCA) gene test is recommended for women who have family members with BRCA-related cancers. Results of the assessment will determine the need for genetic counseling and BRCA1 and for BRCA2 testing. BRCA-related cancers include these types: ? Breast. This occurs in males or females. ? Ovarian. ? Tubal. This may also be called fallopian tube cancer. ? Cancer of the abdominal or pelvic lining (peritoneal cancer). ? Prostate. ? Pancreatic.  Cervical, Uterine, and Ovarian Cancer Your health care provider may recommend that you be screened regularly for cancer of the pelvic organs. These include your ovaries, uterus, and vagina. This screening involves a pelvic exam, which includes checking for microscopic changes to the surface of your cervix (Pap test).  For women ages 21-65, health care providers may recommend a pelvic exam and a Pap test every three years. For women ages 51-65, they may recommend the Pap test and pelvic exam, combined with testing for human papilloma virus (HPV), every five years. Some types of HPV increase your risk of cervical cancer. Testing for HPV may also be done on women of any age who have unclear Pap test results.  Other health care providers may not recommend any screening for nonpregnant women who are considered low risk for pelvic cancer and have no symptoms. Ask your  health care provider if a screening pelvic exam is right for you.  If you have had past treatment for cervical cancer or a condition that could lead to cancer, you need Pap tests and screening for cancer for at least 20 years after your treatment. If Pap tests have been discontinued for you, your risk factors (such as having a new sexual partner) need to be reassessed to determine if you should start having screenings again. Some women have medical problems that increase the chance of getting cervical cancer. In these cases, your health care provider may recommend that you have screening and Pap tests more often.  If you have a family history of uterine cancer or ovarian cancer, talk with your health care provider about genetic screening.  If you have vaginal bleeding after reaching menopause, tell your health care provider.  There are currently no reliable tests available to screen for ovarian cancer.  Lung Cancer Lung cancer screening is recommended for adults 15-42 years old who are at high risk for lung cancer because of a history of smoking. A yearly low-dose CT scan of the lungs is recommended if you:  Currently smoke.  Have a history of at least 30 pack-years of smoking and you currently  smoke or have quit within the past 15 years. A pack-year is smoking an average of one pack of cigarettes per day for one year.  Yearly screening should:  Continue until it has been 15 years since you quit.  Stop if you develop a health problem that would prevent you from having lung cancer treatment.  Colorectal Cancer  This type of cancer can be detected and can often be prevented.  Routine colorectal cancer screening usually begins at age 33 and continues through age 67.  If you have risk factors for colon cancer, your health care provider may recommend that you be screened at an earlier age.  If you have a family history of colorectal cancer, talk with your health care provider about genetic  screening.  Your health care provider may also recommend using home test kits to check for hidden blood in your stool.  A small camera at the end of a tube can be used to examine your colon directly (sigmoidoscopy or colonoscopy). This is done to check for the earliest forms of colorectal cancer.  Direct examination of the colon should be repeated every 5-10 years until age 36. However, if early forms of precancerous polyps or small growths are found or if you have a family history or genetic risk for colorectal cancer, you may need to be screened more often.  Skin Cancer  Check your skin from head to toe regularly.  Monitor any moles. Be sure to tell your health care provider: ? About any new moles or changes in moles, especially if there is a change in a mole's shape or color. ? If you have a mole that is larger than the size of a pencil eraser.  If any of your family members has a history of skin cancer, especially at a young age, talk with your health care provider about genetic screening.  Always use sunscreen. Apply sunscreen liberally and repeatedly throughout the day.  Whenever you are outside, protect yourself by wearing long sleeves, pants, a wide-brimmed hat, and sunglasses.  What should I know about osteoporosis? Osteoporosis is a condition in which bone destruction happens more quickly than new bone creation. After menopause, you may be at an increased risk for osteoporosis. To help prevent osteoporosis or the bone fractures that can happen because of osteoporosis, the following is recommended:  If you are 46-1 years old, get at least 1,000 mg of calcium and at least 600 mg of vitamin D per day.  If you are older than age 17 but younger than age 6, get at least 1,200 mg of calcium and at least 600 mg of vitamin D per day.  If you are older than age 75, get at least 1,200 mg of calcium and at least 800 mg of vitamin D per day.  Smoking and excessive alcohol intake  increase the risk of osteoporosis. Eat foods that are rich in calcium and vitamin D, and do weight-bearing exercises several times each week as directed by your health care provider. What should I know about how menopause affects my mental health? Depression may occur at any age, but it is more common as you become older. Common symptoms of depression include:  Low or sad mood.  Changes in sleep patterns.  Changes in appetite or eating patterns.  Feeling an overall lack of motivation or enjoyment of activities that you previously enjoyed.  Frequent crying spells.  Talk with your health care provider if you think that you are experiencing depression. What should  I know about immunizations? It is important that you get and maintain your immunizations. These include:  Tetanus, diphtheria, and pertussis (Tdap) booster vaccine.  Influenza every year before the flu season begins.  Pneumonia vaccine.  Shingles vaccine.  Your health care provider may also recommend other immunizations. This information is not intended to replace advice given to you by your health care provider. Make sure you discuss any questions you have with your health care provider. Document Released: 03/20/2005 Document Revised: 08/16/2015 Document Reviewed: 10/30/2014 Elsevier Interactive Patient Education  2018 [employer].

## 2016-08-14 NOTE — Progress Notes (Signed)
Subjective

## 2016-08-16 NOTE — Assessment & Plan Note (Signed)
Annual comprehensive preventive exam was done as well as an evaluation and management of chronic conditions .  During the course of the visit the patient was educated and counseled about appropriate screening and preventive services including :  diabetes screening, lipid analysis with projected  10 year  risk for CAD , nutrition counseling, breast, cervical and colorectal cancer screening, and recommended immunizations.  Printed recommendations for health maintenance screenings was given 

## 2016-08-16 NOTE — Progress Notes (Signed)
Patient ID: Madison Brown, female    DOB: [birth year]  Age: 83 y.o. MRN: [id number]  The patient is here for annual preventive examination and management of other chronic and acute problems.   The risk factors are reflected in the social history.  The roster of all physicians providing medical care to patient - is listed in the Snapshot section of the chart.  Activities of daily living:  The patient is 100% independent in all ADLs: dressing, toileting, feeding as well as independent mobility  Home safety : The patient has smoke detectors in the home. They wear seatbelts.  There are no firearms at home. There is no violence in the home.   There is no risks for hepatitis, STDs or HIV. There is no   history of blood transfusion. They have no travel history to infectious disease endemic areas of the world.  The patient has seen their dentist in the last six month. They have seen their eye doctor in the last year. They admit to slight hearing difficulty with regard to whispered voices and some television programs.  They have deferred audiologic testing in the last year.  They do not  have excessive sun exposure. Discussed the need for sun protection: hats, long sleeves and use of sunscreen if there is significant sun exposure.   Diet: the importance of a healthy diet is discussed. They do have a healthy diet.  The benefits of regular aerobic exercise were discussed. She walks 4 times per week ,  20 minutes.   Depression screen: there are no signs or vegative symptoms of depression- irritability, change in appetite, anhedonia, sadness/tearfullness.  Cognitive assessment: the patient manages all their financial and personal affairs and is actively engaged. They could relate day,date,year and events; recalled 2/3 objects at 3 minutes; performed clock-face test normally.  The following portions of the patient's history were reviewed and updated as appropriate: allergies, current medications, past family  history, past medical history,  past surgical history, past social history  and problem list.  Visual acuity was not assessed per patient preference since she has regular follow up with her ophthalmologist. Hearing and body mass index were assessed and reviewed.   During the course of the visit the patient was educated and counseled about appropriate screening and preventive services including : fall prevention , diabetes screening, nutrition counseling, colorectal cancer screening, and recommended immunizations.    CC: The primary encounter diagnosis was Dark urine. Diagnoses of Weight loss, Hyperlipidemia with target LDL less than 130, Thrombocytopenia (HCC), Vitamin D deficiency, and Postmenopausal estrogen deficiency were also pertinent to this visit. transition of care .  Last seen in office by Dr Dan HumphreysWalker Sept 2015 fpr her wellness visit . Marland Kitchen.   Sees dr Gwen PoundsKowalski for hypertension,  Aortic valve stenosis , severe,  Last visit was  June 5,  No changes made .  No longer on plavix ,  takig baby aspirin,    tolerating  Pravastatin  Had scrambled eggs  And juice about 7:00 DEXA needed not on fridays. Urine  Has been dark Weight loss unintentional discussed.  Eating less in the summer ,  Weight incrases in the winter   Last eye exam  2 YEARS AGO BY Dr. Alvester MorinBell  Reminder given  Refused vaccines   bp elevated at home after exercising     History Madison Brown has a past medical history of Coronary artery disease.   She has a past surgical history that includes Coronary artery bypass graft (2009) and  Vaginal delivery.   Her family history is not on file.She reports that she has never smoked. She has never used smokeless tobacco. She reports that she does not drink alcohol or use drugs.  Outpatient Medications Prior to Visit  Medication Sig Dispense Refill  . AMLODIPINE BESYLATE PO Take 5 mg by mouth daily.     . clopidogrel (PLAVIX) 75 MG tablet Take 75 mg by mouth daily.    . pravastatin (PRAVACHOL)  20 MG tablet Take 20 mg by mouth daily.      No facility-administered medications prior to visit.     Review of Systems   Patient denies headache, fevers, malaise, unintentional weight loss, skin rash, eye pain, sinus congestion and sinus pain, sore throat, dysphagia,  hemoptysis , cough, dyspnea, wheezing, chest pain, palpitations, orthopnea, edema, abdominal pain, nausea, melena, diarrhea, constipation, flank pain, dysuria, hematuria, urinary  Frequency, nocturia, numbness, tingling, seizures,  Focal weakness, Loss of consciousness,  Tremor, insomnia, depression, anxiety, and suicidal ideation.      Objective:  BP (!) 174/76 (BP Location: Left Arm, Patient Position: Sitting, Cuff Size: Normal)   Pulse 73   Temp 98.1 F (36.7 C) (Oral)   Resp 15   Ht 5\' 1"  (1.549 m)   Wt 89 lb 9.6 oz (40.6 kg)   SpO2 98%   BMI 16.93 kg/m   Physical Exam   General appearance: alert, cooperative and appears stated age Ears: normal TM's and external ear canals both ears Throat: lips, mucosa, and tongue normal; teeth and gums normal Neck: no adenopathy, no carotid bruit, supple, symmetrical, trachea midline and thyroid not enlarged, symmetric, no tenderness/mass/nodules Back: symmetric, no curvature. ROM normal. No CVA tenderness. Lungs: clear to auscultation bilaterally Heart: regular rate and rhythm, S1, S2 normal, no murmur, click, rub or gallop Abdomen: soft, non-tender; bowel sounds normal; no masses,  no organomegaly Pulses: 2+ and symmetric Skin: Skin color, texture, turgor normal. No rashes or lesions Lymph nodes: Cervical, supraclavicular, and axillary nodes normal.    Assessment & Plan:   Problem List Items Addressed This Visit    None    Visit Diagnoses    Dark urine    -  Primary   Relevant Orders   Urinalysis, Routine w reflex microscopic (Completed)   Weight loss       Relevant Orders   Comprehensive metabolic panel (Completed)   TSH (Completed)   Hyperlipidemia with  target LDL less than 130       Relevant Medications   aspirin EC 81 MG tablet   Other Relevant Orders   Lipid panel (Completed)   Thrombocytopenia (HCC)       Relevant Orders   CBC with Differential/Platelet (Completed)   Vitamin D deficiency       Relevant Orders   VITAMIN D 25 Hydroxy (Vit-D Deficiency, Fractures) (Completed)   Postmenopausal estrogen deficiency       Relevant Orders   DG Bone Density      I am having Madison Brown maintain her clopidogrel, AMLODIPINE BESYLATE PO, pravastatin, and aspirin EC.  Meds ordered this encounter  Medications  . aspirin EC 81 MG tablet    Sig: Take by mouth.    There are no discontinued medications.  Follow-up: No Follow-up on file.   Sherlene Shams, MD

## 2016-08-16 NOTE — Assessment & Plan Note (Addendum)
she reports compliance with medication regimen  but has an elevated reading today in office.  she has been asked to check his BP at home and  submit readings for evaluation. Renal is stable    Lab Results  Component Value Date   CREATININE 1.21 (H) 08/14/2016   Lab Results  Component Value Date   NA 141 08/14/2016   K 4.2 08/14/2016   CL 100 08/14/2016   CO2 33 (H) 08/14/2016

## 2016-10-05 ENCOUNTER — Encounter: Payer: Self-pay | Admitting: Internal Medicine

## 2016-11-05 ENCOUNTER — Ambulatory Visit: Payer: Medicare Other

## 2017-01-12 DIAGNOSIS — E782 Mixed hyperlipidemia: Secondary | ICD-10-CM | POA: Diagnosis not present

## 2017-01-12 DIAGNOSIS — I25810 Atherosclerosis of coronary artery bypass graft(s) without angina pectoris: Secondary | ICD-10-CM | POA: Diagnosis not present

## 2017-01-12 DIAGNOSIS — I10 Essential (primary) hypertension: Secondary | ICD-10-CM | POA: Diagnosis not present

## 2017-01-12 DIAGNOSIS — I350 Nonrheumatic aortic (valve) stenosis: Secondary | ICD-10-CM | POA: Diagnosis not present

## 2017-01-27 DIAGNOSIS — I350 Nonrheumatic aortic (valve) stenosis: Secondary | ICD-10-CM | POA: Diagnosis not present

## 2017-01-27 DIAGNOSIS — I25810 Atherosclerosis of coronary artery bypass graft(s) without angina pectoris: Secondary | ICD-10-CM | POA: Diagnosis not present

## 2017-02-04 DIAGNOSIS — I10 Essential (primary) hypertension: Secondary | ICD-10-CM | POA: Diagnosis not present

## 2017-02-23 ENCOUNTER — Encounter: Payer: Self-pay | Admitting: Internal Medicine

## 2017-02-23 ENCOUNTER — Ambulatory Visit: Payer: Medicare Other | Admitting: Internal Medicine

## 2017-02-23 VITALS — BP 124/72 | HR 71 | Temp 97.7°F | Resp 15 | Wt 89.8 lb

## 2017-02-23 DIAGNOSIS — R636 Underweight: Secondary | ICD-10-CM

## 2017-02-23 DIAGNOSIS — I10 Essential (primary) hypertension: Secondary | ICD-10-CM

## 2017-02-23 DIAGNOSIS — Z0000 Encounter for general adult medical examination without abnormal findings: Secondary | ICD-10-CM

## 2017-02-23 DIAGNOSIS — Z7902 Long term (current) use of antithrombotics/antiplatelets: Secondary | ICD-10-CM

## 2017-02-23 DIAGNOSIS — E785 Hyperlipidemia, unspecified: Secondary | ICD-10-CM

## 2017-02-23 LAB — CBC WITH DIFFERENTIAL/PLATELET
Basophils Absolute: 0.1 10*3/uL (ref 0.0–0.1)
Basophils Relative: 0.8 % (ref 0.0–3.0)
EOS PCT: 1.8 % (ref 0.0–5.0)
Eosinophils Absolute: 0.1 10*3/uL (ref 0.0–0.7)
HCT: 38.9 % (ref 36.0–46.0)
Hemoglobin: 12.9 g/dL (ref 12.0–15.0)
Lymphocytes Relative: 25.1 % (ref 12.0–46.0)
Lymphs Abs: 2 10*3/uL (ref 0.7–4.0)
MCHC: 33.3 g/dL (ref 30.0–36.0)
MCV: 92.8 fl (ref 78.0–100.0)
MONO ABS: 0.9 10*3/uL (ref 0.1–1.0)
Monocytes Relative: 11.8 % (ref 3.0–12.0)
NEUTROS PCT: 60.5 % (ref 43.0–77.0)
Neutro Abs: 4.9 10*3/uL (ref 1.4–7.7)
Platelets: 160 10*3/uL (ref 150.0–400.0)
RBC: 4.19 Mil/uL (ref 3.87–5.11)
RDW: 12.8 % (ref 11.5–15.5)
WBC: 8 10*3/uL (ref 4.0–10.5)

## 2017-02-23 LAB — COMPREHENSIVE METABOLIC PANEL
ALBUMIN: 4.4 g/dL (ref 3.5–5.2)
ALT: 9 U/L (ref 0–35)
AST: 16 U/L (ref 0–37)
Alkaline Phosphatase: 81 U/L (ref 39–117)
BUN: 19 mg/dL (ref 6–23)
CHLORIDE: 101 meq/L (ref 96–112)
CO2: 31 mEq/L (ref 19–32)
Calcium: 9.5 mg/dL (ref 8.4–10.5)
Creatinine, Ser: 1.06 mg/dL (ref 0.40–1.20)
GFR: 52.26 mL/min — ABNORMAL LOW (ref >60.00)
Glucose, Bld: 109 mg/dL — ABNORMAL HIGH (ref 70–99)
POTASSIUM: 3.6 meq/L (ref 3.5–5.1)
SODIUM: 139 meq/L (ref 135–145)
Total Bilirubin: 0.4 mg/dL (ref 0.2–1.2)
Total Protein: 8 g/dL (ref 6.0–8.3)

## 2017-02-23 LAB — LIPID PANEL
Cholesterol: 180 mg/dL (ref 0–200)
HDL: 63.7 mg/dL (ref >39.00)
LDL Cholesterol: 97 mg/dL (ref 0–99)
NonHDL: 116.5
Total CHOL/HDL Ratio: 3
Triglycerides: 100 mg/dL (ref 0.0–149.0)
VLDL: 20 mg/dL (ref 0.0–40.0)

## 2017-02-23 LAB — TSH: TSH: 5.38 u[IU]/mL — AB (ref 0.35–4.50)

## 2017-02-23 NOTE — Progress Notes (Signed)
Patient ID: Madison FortsBetty J Brown, female    DOB: [birth year]  Age: 84 y.o. MRN: [id number]  The patient is here for annual  preventive examination and management of other chronic and acute problems.    DEXA 2014 Due for Prevnar and TDAP. DECLINES BOTH OF THEM  Defers mammograms   The risk factors are reflected in the social history.  The roster of all physicians providing medical care to patient - is listed in the Snapshot section of the chart.  Activities of daily living:  The patient is 100% independent in all ADLs: dressing, toileting, feeding as well as independent mobility  Home safety : The patient has smoke detectors in the home. They wear seatbelts.  There are no firearms at home. There is no violence in the home.   There is no risks for hepatitis, STDs or HIV. There is no   history of blood transfusion. They have no travel history to infectious disease endemic areas of the world.  The patient has seen their dentist in the last six month. They have seen their eye doctor in the last year. They admit to slight hearing difficulty with regard to whispered voices and some television programs.  They have deferred audiologic testing in the last year.  They do not  have excessive sun exposure. Discussed the need for sun protection: hats, long sleeves and use of sunscreen if there is significant sun exposure.   Diet: the importance of a healthy diet is discussed. They do have a healthy diet.  The benefits of regular aerobic exercise were discussed. She walks 2 miles daily for the past 9 years. Sever since her surgery   Depression screen: there are no signs or vegative symptoms of depression- irritability, change in appetite, anhedonia, sadness/tearfullness.  Cognitive assessment: the patient manages all their financial and personal affairs and is actively engaged. They could relate day,date,year and events; recalled 2/3 objects at 3 minutes; performed clock-face test normally.  The following  portions of the patient's history were reviewed and updated as appropriate: allergies, current medications, past family history, past medical history,  past surgical history, past social history  and problem list.  Visual acuity was not assessed per patient preference since she has regular follow up with her ophthalmologist. Hearing and body mass index were assessed and reviewed.   During the course of the visit the patient was educated and counseled about appropriate screening and preventive services including : fall prevention , diabetes screening, nutrition counseling, colorectal cancer screening, and recommended immunizations.    During the course of the visit , End of Life objectives were discussed at length,  Patient does not have a living will in place or a healthcare power of attorney.  She was given printed information about advance directives and encouraged to return after discussing with her family,    And amll CADL  Stress test dec 21 by Gwen PoundsKowalski had a CBAG  2009.  Taking baby aspirin and amlodipine  History Madison RhodesBetty has a past medical history of Coronary artery disease.   She has a past surgical history that includes Coronary artery bypass graft (2009) and Vaginal delivery.   Her family history is not on file.She reports that  has never smoked. she has never used smokeless tobacco. She reports that she does not drink alcohol or use drugs.  Outpatient Medications Prior to Visit  Medication Sig Dispense Refill  . AMLODIPINE BESYLATE PO Take 5 mg by mouth daily.     Marland Kitchen. aspirin EC 81 MG  tablet Take by mouth.    . clopidogrel (PLAVIX) 75 MG tablet Take 75 mg by mouth daily.    . pravastatin (PRAVACHOL) 20 MG tablet Take 20 mg by mouth daily.      No facility-administered medications prior to visit.     Review of Systems   Patient denies headache, fevers, malaise, unintentional weight loss, skin rash, eye pain, sinus congestion and sinus pain, sore throat, dysphagia,  hemoptysis ,  cough, dyspnea, wheezing, chest pain, palpitations, orthopnea, edema, abdominal pain, nausea, melena, diarrhea, constipation, flank pain, dysuria, hematuria, urinary  Frequency, nocturia, numbness, tingling, seizures,  Focal weakness, Loss of consciousness,  Tremor, insomnia, depression, anxiety, and suicidal ideation.      Objective:  BP 124/72 (BP Location: Left Arm, Patient Position: Sitting, Cuff Size: Normal)   Pulse 71   Temp 97.7 F (36.5 C) (Oral)   Resp 15   Wt 89 lb 12.8 oz (40.7 kg)   SpO2 97%   BMI 16.97 kg/m   Physical Exam  General appearance: alert, cooperative and appears stated age Ears: normal TM's and external ear canals both ears Throat: lips, mucosa, and tongue normal; teeth and gums normal Neck: no adenopathy, no carotid bruit, supple, symmetrical, trachea midline and thyroid not enlarged, symmetric, no tenderness/mass/nodules Back: symmetric, no curvature. ROM normal. No CVA tenderness. Lungs: clear to auscultation bilaterally Heart: regular rate and rhythm, S1, S2 normal, no murmur, click, rub or gallop Abdomen: soft, non-tender; bowel sounds normal; no masses,  no organomegaly Pulses: 2+ and symmetric Skin: Skin color, texture, turgor normal. No rashes or lesions Lymph nodes: Cervical, supraclavicular, and axillary nodes normal.    Assessment & Plan:   Problem List Items Addressed This Visit    Underweight     I have reviewed her diet and recommended that she increase her protein and fat intake while monitoring her carbohydrates.       Relevant Orders   Comprehensive metabolic panel (Completed)   TSH (Completed)   Encounter for preventive health examination    Annual comprehensive preventive exam was done as well as an evaluation and management of chronic conditions .  During the course of the visit the patient was educated and counseled about appropriate screening and preventive services including :  diabetes screening, lipid analysis with projected   10 year  risk for CAD , nutrition counseling, breast, cervical and colorectal cancer screening, and recommended immunizations.  Printed recommendations for health maintenance screenings was given      Essential hypertension, benign    Well controlled on current regimen. Renal function stable, no changes today.  Lab Results  Component Value Date   CREATININE 1.06 02/23/2017   Lab Results  Component Value Date   NA 139 02/23/2017   K 3.6 02/23/2017   CL 101 02/23/2017   CO2 31 02/23/2017          Other Visit Diagnoses    Encounter for current long term use of antiplatelet drug    -  Primary   Relevant Orders   CBC with Differential/Platelet (Completed)   Hyperlipidemia LDL goal <100       Relevant Orders   Lipid panel (Completed)      I am having Madison Brown maintain her clopidogrel, AMLODIPINE BESYLATE PO, pravastatin, and aspirin EC.  No orders of the defined types were placed in this encounter.   There are no discontinued medications.  Follow-up: Return in about 6 months (around 08/23/2017), or labs only .   Rosey Bath  Ether Griffins, MD

## 2017-02-23 NOTE — Assessment & Plan Note (Signed)
I have reviewed her diet and recommended that she increase her protein and fat intake while monitoring her carbohydrates.  

## 2017-02-23 NOTE — Patient Instructions (Signed)
You are doing quite well!  You are due for bloodwork today to make sure your medications are not affecting your kidneys,  Liver and platelets  Please discuss your wishes for aggressive or non aggressive care at [facility] with your daughter so we can make sure your wishes  would be honored    Health Maintenance for Postmenopausal Women Menopause is a normal process in which your reproductive ability comes to an end. This process happens gradually over a span of months to years, usually between the ages of 43 and 42. Menopause is complete when you have missed 12 consecutive menstrual periods. It is important to talk with your health care provider about some of the most common conditions that affect postmenopausal women, such as heart disease, cancer, and bone loss (osteoporosis). Adopting a healthy lifestyle and getting preventive care can help to promote your health and wellness. Those actions can also lower your chances of developing some of these common conditions. What should I know about menopause? During menopause, you may experience a number of symptoms, such as:  Moderate-to-severe hot flashes.  Night sweats.  Decrease in sex drive.  Mood swings.  Headaches.  Tiredness.  Irritability.  Memory problems.  Insomnia.  Choosing to treat or not to treat menopausal changes is an individual decision that you make with your health care provider. What should I know about hormone replacement therapy and supplements? Hormone therapy products are effective for treating symptoms that are associated with menopause, such as hot flashes and night sweats. Hormone replacement carries certain risks, especially as you become older. If you are thinking about using estrogen or estrogen with progestin treatments, discuss the benefits and risks with your health care provider. What should I know about heart disease and stroke? Heart disease, heart attack, and stroke become more likely as you age. This  may be due, in part, to the hormonal changes that your body experiences during menopause. These can affect how your body processes dietary fats, triglycerides, and cholesterol. Heart attack and stroke are both medical emergencies. There are many things that you can do to help prevent heart disease and stroke:  Have your blood pressure checked at least every 1-2 years. High blood pressure causes heart disease and increases the risk of stroke.  If you are 74-2 years old, ask your health care provider if you should take aspirin to prevent a heart attack or a stroke.  Do not use any tobacco products, including cigarettes, chewing tobacco, or electronic cigarettes. If you need help quitting, ask your health care provider.  It is important to eat a healthy diet and maintain a healthy weight. ? Be sure to include plenty of vegetables, fruits, low-fat dairy products, and lean protein. ? Avoid eating foods that are high in solid fats, added sugars, or salt (sodium).  Get regular exercise. This is one of the most important things that you can do for your health. ? Try to exercise for at least 150 minutes each week. The type of exercise that you do should increase your heart rate and make you sweat. This is known as moderate-intensity exercise. ? Try to do strengthening exercises at least twice each week. Do these in addition to the moderate-intensity exercise.  Know your numbers.Ask your health care provider to check your cholesterol and your blood glucose. Continue to have your blood tested as directed by your health care provider.  What should I know about cancer screening? There are several types of cancer. Take the following  steps to reduce your risk and to catch any cancer development as early as possible. Breast Cancer  Practice breast self-awareness. ? This means understanding how your breasts normally appear and feel. ? It also means doing regular breast self-exams. Let your health care  provider know about any changes, no matter how small.  If you are 60 or older, have a clinician do a breast exam (clinical breast exam or CBE) every year. Depending on your age, family history, and medical history, it may be recommended that you also have a yearly breast X-ray (mammogram).  If you have a family history of breast cancer, talk with your health care provider about genetic screening.  If you are at high risk for breast cancer, talk with your health care provider about having an MRI and a mammogram every year.  Breast cancer (BRCA) gene test is recommended for women who have family members with BRCA-related cancers. Results of the assessment will determine the need for genetic counseling and BRCA1 and for BRCA2 testing. BRCA-related cancers include these types: ? Breast. This occurs in males or females. ? Ovarian. ? Tubal. This may also be called fallopian tube cancer. ? Cancer of the abdominal or pelvic lining (peritoneal cancer). ? Prostate. ? Pancreatic.  Cervical, Uterine, and Ovarian Cancer Your health care provider may recommend that you be screened regularly for cancer of the pelvic organs. These include your ovaries, uterus, and vagina. This screening involves a pelvic exam, which includes checking for microscopic changes to the surface of your cervix (Pap test).  For women ages 21-65, health care providers may recommend a pelvic exam and a Pap test every three years. For women ages 81-65, they may recommend the Pap test and pelvic exam, combined with testing for human papilloma virus (HPV), every five years. Some types of HPV increase your risk of cervical cancer. Testing for HPV may also be done on women of any age who have unclear Pap test results.  Other health care providers may not recommend any screening for nonpregnant women who are considered low risk for pelvic cancer and have no symptoms. Ask your health care provider if a screening pelvic exam is right for  you.  If you have had past treatment for cervical cancer or a condition that could lead to cancer, you need Pap tests and screening for cancer for at least 20 years after your treatment. If Pap tests have been discontinued for you, your risk factors (such as having a new sexual partner) need to be reassessed to determine if you should start having screenings again. Some women have medical problems that increase the chance of getting cervical cancer. In these cases, your health care provider may recommend that you have screening and Pap tests more often.  If you have a family history of uterine cancer or ovarian cancer, talk with your health care provider about genetic screening.  If you have vaginal bleeding after reaching menopause, tell your health care provider.  There are currently no reliable tests available to screen for ovarian cancer.  Lung Cancer Lung cancer screening is recommended for adults 19-7 years old who are at high risk for lung cancer because of a history of smoking. A yearly low-dose CT scan of the lungs is recommended if you:  Currently smoke.  Have a history of at least 30 pack-years of smoking and you currently smoke or have quit within the past 15 years. A pack-year is smoking an average of one pack of cigarettes per day for  one year.  Yearly screening should:  Continue until it has been 15 years since you quit.  Stop if you develop a health problem that would prevent you from having lung cancer treatment.  Colorectal Cancer  This type of cancer can be detected and can often be prevented.  Routine colorectal cancer screening usually begins at age 59 and continues through age 63.  If you have risk factors for colon cancer, your health care provider may recommend that you be screened at an earlier age.  If you have a family history of colorectal cancer, talk with your health care provider about genetic screening.  Your health care provider may also recommend  using home test kits to check for hidden blood in your stool.  A small camera at the end of a tube can be used to examine your colon directly (sigmoidoscopy or colonoscopy). This is done to check for the earliest forms of colorectal cancer.  Direct examination of the colon should be repeated every 5-10 years until age 88. However, if early forms of precancerous polyps or small growths are found or if you have a family history or genetic risk for colorectal cancer, you may need to be screened more often.  Skin Cancer  Check your skin from head to toe regularly.  Monitor any moles. Be sure to tell your health care provider: ? About any new moles or changes in moles, especially if there is a change in a mole's shape or color. ? If you have a mole that is larger than the size of a pencil eraser.  If any of your family members has a history of skin cancer, especially at a young age, talk with your health care provider about genetic screening.  Always use sunscreen. Apply sunscreen liberally and repeatedly throughout the day.  Whenever you are outside, protect yourself by wearing long sleeves, pants, a wide-brimmed hat, and sunglasses.  What should I know about osteoporosis? Osteoporosis is a condition in which bone destruction happens more quickly than new bone creation. After menopause, you may be at an increased risk for osteoporosis. To help prevent osteoporosis or the bone fractures that can happen because of osteoporosis, the following is recommended:  If you are 50-23 years old, get at least 1,000 mg of calcium and at least 600 mg of vitamin D per day.  If you are older than age 45 but younger than age 40, get at least 1,200 mg of calcium and at least 600 mg of vitamin D per day.  If you are older than age 84, get at least 1,200 mg of calcium and at least 800 mg of vitamin D per day.  Smoking and excessive alcohol intake increase the risk of osteoporosis. Eat foods that are rich in  calcium and vitamin D, and do weight-bearing exercises several times each week as directed by your health care provider. What should I know about how menopause affects my mental health? Depression may occur at any age, but it is more common as you become older. Common symptoms of depression include:  Low or sad mood.  Changes in sleep patterns.  Changes in appetite or eating patterns.  Feeling an overall lack of motivation or enjoyment of activities that you previously enjoyed.  Frequent crying spells.  Talk with your health care provider if you think that you are experiencing depression. What should I know about immunizations? It is important that you get and maintain your immunizations. These include:  Tetanus, diphtheria, and pertussis (Tdap) booster  vaccine.  Influenza every year before the flu season begins.  Pneumonia vaccine.  Shingles vaccine.  Your health care provider may also recommend other immunizations. This information is not intended to replace advice given to you by your health care provider. Make sure you discuss any questions you have with your health care provider. Document Released: 03/20/2005 Document Revised: 08/16/2015 Document Reviewed: 10/30/2014 Elsevier Interactive Patient Education  2018 [employer].

## 2017-02-24 NOTE — Assessment & Plan Note (Signed)
Well controlled on current regimen. Renal function stable, no changes today.  Lab Results  Component Value Date   CREATININE 1.06 02/23/2017   Lab Results  Component Value Date   NA 139 02/23/2017   K 3.6 02/23/2017   CL 101 02/23/2017   CO2 31 02/23/2017

## 2017-02-24 NOTE — Assessment & Plan Note (Signed)
Annual comprehensive preventive exam was done as well as an evaluation and management of chronic conditions .  During the course of the visit the patient was educated and counseled about appropriate screening and preventive services including :  diabetes screening, lipid analysis with projected  10 year  risk for CAD , nutrition counseling, breast, cervical and colorectal cancer screening, and recommended immunizations.  Printed recommendations for health maintenance screenings was given 

## 2017-03-08 DIAGNOSIS — I10 Essential (primary) hypertension: Secondary | ICD-10-CM | POA: Diagnosis not present

## 2017-03-08 DIAGNOSIS — I340 Nonrheumatic mitral (valve) insufficiency: Secondary | ICD-10-CM | POA: Diagnosis not present

## 2017-03-08 DIAGNOSIS — I25810 Atherosclerosis of coronary artery bypass graft(s) without angina pectoris: Secondary | ICD-10-CM | POA: Diagnosis not present

## 2017-03-08 DIAGNOSIS — I119 Hypertensive heart disease without heart failure: Secondary | ICD-10-CM | POA: Diagnosis not present

## 2017-09-06 DIAGNOSIS — I25810 Atherosclerosis of coronary artery bypass graft(s) without angina pectoris: Secondary | ICD-10-CM | POA: Diagnosis not present

## 2017-09-06 DIAGNOSIS — I119 Hypertensive heart disease without heart failure: Secondary | ICD-10-CM | POA: Diagnosis not present

## 2017-09-06 DIAGNOSIS — G459 Transient cerebral ischemic attack, unspecified: Secondary | ICD-10-CM | POA: Diagnosis not present

## 2017-09-06 DIAGNOSIS — I350 Nonrheumatic aortic (valve) stenosis: Secondary | ICD-10-CM | POA: Diagnosis not present

## 2018-03-02 DIAGNOSIS — G459 Transient cerebral ischemic attack, unspecified: Secondary | ICD-10-CM | POA: Diagnosis not present

## 2018-03-02 DIAGNOSIS — I350 Nonrheumatic aortic (valve) stenosis: Secondary | ICD-10-CM | POA: Diagnosis not present

## 2018-03-02 DIAGNOSIS — I25810 Atherosclerosis of coronary artery bypass graft(s) without angina pectoris: Secondary | ICD-10-CM | POA: Diagnosis not present

## 2018-03-02 DIAGNOSIS — E782 Mixed hyperlipidemia: Secondary | ICD-10-CM | POA: Diagnosis not present

## 2018-03-08 DIAGNOSIS — Z961 Presence of intraocular lens: Secondary | ICD-10-CM | POA: Diagnosis not present

## 2018-03-08 DIAGNOSIS — H524 Presbyopia: Secondary | ICD-10-CM | POA: Diagnosis not present

## 2018-04-11 DIAGNOSIS — E782 Mixed hyperlipidemia: Secondary | ICD-10-CM | POA: Diagnosis not present

## 2018-08-17 DIAGNOSIS — G459 Transient cerebral ischemic attack, unspecified: Secondary | ICD-10-CM | POA: Diagnosis not present

## 2018-08-17 DIAGNOSIS — I25810 Atherosclerosis of coronary artery bypass graft(s) without angina pectoris: Secondary | ICD-10-CM | POA: Diagnosis not present

## 2018-08-17 DIAGNOSIS — I119 Hypertensive heart disease without heart failure: Secondary | ICD-10-CM | POA: Diagnosis not present

## 2018-08-17 DIAGNOSIS — I340 Nonrheumatic mitral (valve) insufficiency: Secondary | ICD-10-CM | POA: Diagnosis not present

## 2019-02-22 DIAGNOSIS — I25810 Atherosclerosis of coronary artery bypass graft(s) without angina pectoris: Secondary | ICD-10-CM | POA: Diagnosis not present

## 2019-02-22 DIAGNOSIS — I350 Nonrheumatic aortic (valve) stenosis: Secondary | ICD-10-CM | POA: Diagnosis not present

## 2019-02-22 DIAGNOSIS — I10 Essential (primary) hypertension: Secondary | ICD-10-CM | POA: Diagnosis not present

## 2019-02-22 DIAGNOSIS — E782 Mixed hyperlipidemia: Secondary | ICD-10-CM | POA: Diagnosis not present

## 2019-08-24 DIAGNOSIS — I25810 Atherosclerosis of coronary artery bypass graft(s) without angina pectoris: Secondary | ICD-10-CM | POA: Diagnosis not present

## 2019-08-24 DIAGNOSIS — I10 Essential (primary) hypertension: Secondary | ICD-10-CM | POA: Diagnosis not present

## 2019-08-24 DIAGNOSIS — I350 Nonrheumatic aortic (valve) stenosis: Secondary | ICD-10-CM | POA: Diagnosis not present

## 2019-08-24 DIAGNOSIS — I340 Nonrheumatic mitral (valve) insufficiency: Secondary | ICD-10-CM | POA: Diagnosis not present

## 2019-09-29 DIAGNOSIS — I350 Nonrheumatic aortic (valve) stenosis: Secondary | ICD-10-CM | POA: Diagnosis not present

## 2019-09-29 DIAGNOSIS — I071 Rheumatic tricuspid insufficiency: Secondary | ICD-10-CM | POA: Diagnosis not present

## 2019-09-29 DIAGNOSIS — I340 Nonrheumatic mitral (valve) insufficiency: Secondary | ICD-10-CM | POA: Diagnosis not present

## 2019-10-02 DIAGNOSIS — I350 Nonrheumatic aortic (valve) stenosis: Secondary | ICD-10-CM | POA: Diagnosis not present

## 2019-10-02 DIAGNOSIS — I25810 Atherosclerosis of coronary artery bypass graft(s) without angina pectoris: Secondary | ICD-10-CM | POA: Diagnosis not present

## 2019-10-02 DIAGNOSIS — I340 Nonrheumatic mitral (valve) insufficiency: Secondary | ICD-10-CM | POA: Diagnosis not present

## 2019-10-02 DIAGNOSIS — I10 Essential (primary) hypertension: Secondary | ICD-10-CM | POA: Diagnosis not present

## 2020-03-25 DIAGNOSIS — I350 Nonrheumatic aortic (valve) stenosis: Secondary | ICD-10-CM | POA: Diagnosis not present

## 2020-03-25 DIAGNOSIS — I25810 Atherosclerosis of coronary artery bypass graft(s) without angina pectoris: Secondary | ICD-10-CM | POA: Diagnosis not present

## 2020-03-25 DIAGNOSIS — I340 Nonrheumatic mitral (valve) insufficiency: Secondary | ICD-10-CM | POA: Diagnosis not present

## 2020-03-25 DIAGNOSIS — I10 Essential (primary) hypertension: Secondary | ICD-10-CM | POA: Diagnosis not present

## 2020-04-08 DIAGNOSIS — H524 Presbyopia: Secondary | ICD-10-CM | POA: Diagnosis not present

## 2020-04-08 DIAGNOSIS — Z961 Presence of intraocular lens: Secondary | ICD-10-CM | POA: Diagnosis not present

## 2020-06-26 DIAGNOSIS — I2510 Atherosclerotic heart disease of native coronary artery without angina pectoris: Secondary | ICD-10-CM | POA: Diagnosis not present

## 2020-06-26 DIAGNOSIS — Z780 Asymptomatic menopausal state: Secondary | ICD-10-CM | POA: Diagnosis not present

## 2020-06-26 DIAGNOSIS — I119 Hypertensive heart disease without heart failure: Secondary | ICD-10-CM | POA: Diagnosis not present

## 2020-06-26 DIAGNOSIS — Z0000 Encounter for general adult medical examination without abnormal findings: Secondary | ICD-10-CM | POA: Diagnosis not present

## 2020-06-26 DIAGNOSIS — R7309 Other abnormal glucose: Secondary | ICD-10-CM | POA: Diagnosis not present

## 2020-07-05 DIAGNOSIS — M810 Age-related osteoporosis without current pathological fracture: Secondary | ICD-10-CM | POA: Diagnosis not present

## 2020-09-04 ENCOUNTER — Ambulatory Visit
Admission: RE | Admit: 2020-09-04 | Discharge: 2020-09-04 | Disposition: A | Discharge status: Home / Self Care | Payer: Medicare Other | Source: Ambulatory Visit | Attending: Internal Medicine | Admitting: Internal Medicine

## 2020-09-04 ENCOUNTER — Other Ambulatory Visit: Payer: Self-pay | Admitting: Internal Medicine

## 2020-09-04 ENCOUNTER — Other Ambulatory Visit: Payer: Self-pay

## 2020-09-04 DIAGNOSIS — W19XXXA Unspecified fall, initial encounter: Secondary | ICD-10-CM | POA: Diagnosis not present

## 2020-09-04 DIAGNOSIS — I350 Nonrheumatic aortic (valve) stenosis: Secondary | ICD-10-CM | POA: Diagnosis not present

## 2020-09-04 DIAGNOSIS — R1112 Projectile vomiting: Secondary | ICD-10-CM

## 2020-09-04 DIAGNOSIS — I639 Cerebral infarction, unspecified: Secondary | ICD-10-CM | POA: Diagnosis not present

## 2020-09-04 DIAGNOSIS — I10 Essential (primary) hypertension: Secondary | ICD-10-CM | POA: Diagnosis not present

## 2020-09-04 DIAGNOSIS — Y92009 Unspecified place in unspecified non-institutional (private) residence as the place of occurrence of the external cause: Secondary | ICD-10-CM | POA: Insufficient documentation

## 2020-09-04 DIAGNOSIS — S0990XA Unspecified injury of head, initial encounter: Secondary | ICD-10-CM | POA: Diagnosis not present

## 2020-09-04 DIAGNOSIS — I6782 Cerebral ischemia: Secondary | ICD-10-CM | POA: Diagnosis not present

## 2020-09-10 DIAGNOSIS — H5462 Unqualified visual loss, left eye, normal vision right eye: Secondary | ICD-10-CM | POA: Diagnosis not present

## 2020-09-10 DIAGNOSIS — Z7983 Long term (current) use of bisphosphonates: Secondary | ICD-10-CM | POA: Diagnosis not present

## 2020-09-10 DIAGNOSIS — M6281 Muscle weakness (generalized): Secondary | ICD-10-CM | POA: Diagnosis not present

## 2020-09-10 DIAGNOSIS — Z9181 History of falling: Secondary | ICD-10-CM | POA: Diagnosis not present

## 2020-09-10 DIAGNOSIS — R296 Repeated falls: Secondary | ICD-10-CM | POA: Diagnosis not present

## 2020-09-10 DIAGNOSIS — Z951 Presence of aortocoronary bypass graft: Secondary | ICD-10-CM | POA: Diagnosis not present

## 2020-09-10 DIAGNOSIS — I119 Hypertensive heart disease without heart failure: Secondary | ICD-10-CM | POA: Diagnosis not present

## 2020-09-10 DIAGNOSIS — M810 Age-related osteoporosis without current pathological fracture: Secondary | ICD-10-CM | POA: Diagnosis not present

## 2020-09-10 DIAGNOSIS — Z7902 Long term (current) use of antithrombotics/antiplatelets: Secondary | ICD-10-CM | POA: Diagnosis not present

## 2020-09-10 DIAGNOSIS — I69398 Other sequelae of cerebral infarction: Secondary | ICD-10-CM | POA: Diagnosis not present

## 2020-09-10 DIAGNOSIS — Z953 Presence of xenogenic heart valve: Secondary | ICD-10-CM | POA: Diagnosis not present

## 2020-09-10 DIAGNOSIS — I2510 Atherosclerotic heart disease of native coronary artery without angina pectoris: Secondary | ICD-10-CM | POA: Diagnosis not present

## 2020-09-10 DIAGNOSIS — E782 Mixed hyperlipidemia: Secondary | ICD-10-CM | POA: Diagnosis not present

## 2020-09-10 DIAGNOSIS — I350 Nonrheumatic aortic (valve) stenosis: Secondary | ICD-10-CM | POA: Diagnosis not present

## 2020-09-27 DIAGNOSIS — H5462 Unqualified visual loss, left eye, normal vision right eye: Secondary | ICD-10-CM | POA: Diagnosis not present

## 2020-09-27 DIAGNOSIS — I69398 Other sequelae of cerebral infarction: Secondary | ICD-10-CM | POA: Diagnosis not present

## 2020-09-27 DIAGNOSIS — I119 Hypertensive heart disease without heart failure: Secondary | ICD-10-CM | POA: Diagnosis not present

## 2020-09-27 DIAGNOSIS — M6281 Muscle weakness (generalized): Secondary | ICD-10-CM | POA: Diagnosis not present

## 2020-10-10 DIAGNOSIS — I119 Hypertensive heart disease without heart failure: Secondary | ICD-10-CM | POA: Diagnosis not present

## 2020-10-10 DIAGNOSIS — Z7902 Long term (current) use of antithrombotics/antiplatelets: Secondary | ICD-10-CM | POA: Diagnosis not present

## 2020-10-10 DIAGNOSIS — Z7983 Long term (current) use of bisphosphonates: Secondary | ICD-10-CM | POA: Diagnosis not present

## 2020-10-10 DIAGNOSIS — Z951 Presence of aortocoronary bypass graft: Secondary | ICD-10-CM | POA: Diagnosis not present

## 2020-10-10 DIAGNOSIS — I2510 Atherosclerotic heart disease of native coronary artery without angina pectoris: Secondary | ICD-10-CM | POA: Diagnosis not present

## 2020-10-10 DIAGNOSIS — M810 Age-related osteoporosis without current pathological fracture: Secondary | ICD-10-CM | POA: Diagnosis not present

## 2020-10-10 DIAGNOSIS — R296 Repeated falls: Secondary | ICD-10-CM | POA: Diagnosis not present

## 2020-10-10 DIAGNOSIS — I350 Nonrheumatic aortic (valve) stenosis: Secondary | ICD-10-CM | POA: Diagnosis not present

## 2020-10-10 DIAGNOSIS — H5462 Unqualified visual loss, left eye, normal vision right eye: Secondary | ICD-10-CM | POA: Diagnosis not present

## 2020-10-10 DIAGNOSIS — Z9181 History of falling: Secondary | ICD-10-CM | POA: Diagnosis not present

## 2020-10-10 DIAGNOSIS — M6281 Muscle weakness (generalized): Secondary | ICD-10-CM | POA: Diagnosis not present

## 2020-10-10 DIAGNOSIS — I69398 Other sequelae of cerebral infarction: Secondary | ICD-10-CM | POA: Diagnosis not present

## 2020-10-10 DIAGNOSIS — E782 Mixed hyperlipidemia: Secondary | ICD-10-CM | POA: Diagnosis not present

## 2020-10-10 DIAGNOSIS — Z953 Presence of xenogenic heart valve: Secondary | ICD-10-CM | POA: Diagnosis not present

## 2020-10-11 DIAGNOSIS — I69398 Other sequelae of cerebral infarction: Secondary | ICD-10-CM | POA: Diagnosis not present

## 2020-11-25 ENCOUNTER — Other Ambulatory Visit: Payer: Self-pay

## 2020-11-25 ENCOUNTER — Emergency Department: Payer: Medicare Other

## 2020-11-25 ENCOUNTER — Inpatient Hospital Stay
Admission: EM | Admit: 2020-11-25 | Discharge: 2020-12-06 | DRG: 064 | Disposition: A | Discharge status: Skilled Nursing Facility | Payer: Medicare Other | Attending: Internal Medicine | Admitting: Internal Medicine

## 2020-11-25 DIAGNOSIS — F0390 Unspecified dementia without behavioral disturbance: Secondary | ICD-10-CM | POA: Diagnosis present

## 2020-11-25 DIAGNOSIS — R54 Age-related physical debility: Secondary | ICD-10-CM | POA: Diagnosis present

## 2020-11-25 DIAGNOSIS — Z79899 Other long term (current) drug therapy: Secondary | ICD-10-CM | POA: Diagnosis not present

## 2020-11-25 DIAGNOSIS — Z953 Presence of xenogenic heart valve: Secondary | ICD-10-CM | POA: Diagnosis not present

## 2020-11-25 DIAGNOSIS — I6389 Other cerebral infarction: Secondary | ICD-10-CM | POA: Diagnosis not present

## 2020-11-25 DIAGNOSIS — R1312 Dysphagia, oropharyngeal phase: Secondary | ICD-10-CM | POA: Diagnosis not present

## 2020-11-25 DIAGNOSIS — B961 Klebsiella pneumoniae [K. pneumoniae] as the cause of diseases classified elsewhere: Secondary | ICD-10-CM | POA: Diagnosis not present

## 2020-11-25 DIAGNOSIS — I10 Essential (primary) hypertension: Secondary | ICD-10-CM | POA: Diagnosis present

## 2020-11-25 DIAGNOSIS — R509 Fever, unspecified: Secondary | ICD-10-CM | POA: Diagnosis not present

## 2020-11-25 DIAGNOSIS — R4781 Slurred speech: Secondary | ICD-10-CM | POA: Diagnosis not present

## 2020-11-25 DIAGNOSIS — I2510 Atherosclerotic heart disease of native coronary artery without angina pectoris: Secondary | ICD-10-CM | POA: Diagnosis not present

## 2020-11-25 DIAGNOSIS — E876 Hypokalemia: Secondary | ICD-10-CM | POA: Diagnosis not present

## 2020-11-25 DIAGNOSIS — I739 Peripheral vascular disease, unspecified: Secondary | ICD-10-CM | POA: Diagnosis not present

## 2020-11-25 DIAGNOSIS — R531 Weakness: Secondary | ICD-10-CM | POA: Diagnosis not present

## 2020-11-25 DIAGNOSIS — R000 Tachycardia, unspecified: Secondary | ICD-10-CM | POA: Diagnosis not present

## 2020-11-25 DIAGNOSIS — E7800 Pure hypercholesterolemia, unspecified: Secondary | ICD-10-CM | POA: Diagnosis not present

## 2020-11-25 DIAGNOSIS — I639 Cerebral infarction, unspecified: Secondary | ICD-10-CM | POA: Diagnosis present

## 2020-11-25 DIAGNOSIS — J449 Chronic obstructive pulmonary disease, unspecified: Secondary | ICD-10-CM | POA: Diagnosis not present

## 2020-11-25 DIAGNOSIS — R410 Disorientation, unspecified: Secondary | ICD-10-CM | POA: Diagnosis not present

## 2020-11-25 DIAGNOSIS — E785 Hyperlipidemia, unspecified: Secondary | ICD-10-CM | POA: Diagnosis present

## 2020-11-25 DIAGNOSIS — R29713 NIHSS score 13: Secondary | ICD-10-CM | POA: Diagnosis present

## 2020-11-25 DIAGNOSIS — G459 Transient cerebral ischemic attack, unspecified: Secondary | ICD-10-CM | POA: Insufficient documentation

## 2020-11-25 DIAGNOSIS — R7303 Prediabetes: Secondary | ICD-10-CM | POA: Diagnosis present

## 2020-11-25 DIAGNOSIS — Z951 Presence of aortocoronary bypass graft: Secondary | ICD-10-CM | POA: Diagnosis not present

## 2020-11-25 DIAGNOSIS — I69312 Visuospatial deficit and spatial neglect following cerebral infarction: Secondary | ICD-10-CM | POA: Diagnosis not present

## 2020-11-25 DIAGNOSIS — Z681 Body mass index (BMI) 19 or less, adult: Secondary | ICD-10-CM | POA: Diagnosis not present

## 2020-11-25 DIAGNOSIS — Z743 Need for continuous supervision: Secondary | ICD-10-CM | POA: Diagnosis not present

## 2020-11-25 DIAGNOSIS — I6622 Occlusion and stenosis of left posterior cerebral artery: Secondary | ICD-10-CM | POA: Diagnosis not present

## 2020-11-25 DIAGNOSIS — E43 Unspecified severe protein-calorie malnutrition: Secondary | ICD-10-CM | POA: Diagnosis present

## 2020-11-25 DIAGNOSIS — R29810 Facial weakness: Secondary | ICD-10-CM | POA: Diagnosis present

## 2020-11-25 DIAGNOSIS — N390 Urinary tract infection, site not specified: Secondary | ICD-10-CM | POA: Diagnosis not present

## 2020-11-25 DIAGNOSIS — I6523 Occlusion and stenosis of bilateral carotid arteries: Secondary | ICD-10-CM | POA: Diagnosis not present

## 2020-11-25 DIAGNOSIS — U071 COVID-19: Secondary | ICD-10-CM | POA: Diagnosis not present

## 2020-11-25 DIAGNOSIS — Z7982 Long term (current) use of aspirin: Secondary | ICD-10-CM | POA: Diagnosis not present

## 2020-11-25 DIAGNOSIS — I69391 Dysphagia following cerebral infarction: Secondary | ICD-10-CM | POA: Diagnosis not present

## 2020-11-25 DIAGNOSIS — I6603 Occlusion and stenosis of bilateral middle cerebral arteries: Secondary | ICD-10-CM | POA: Diagnosis not present

## 2020-11-25 DIAGNOSIS — I69398 Other sequelae of cerebral infarction: Secondary | ICD-10-CM | POA: Diagnosis not present

## 2020-11-25 DIAGNOSIS — Z8616 Personal history of COVID-19: Secondary | ICD-10-CM | POA: Diagnosis not present

## 2020-11-25 LAB — URINALYSIS, COMPLETE (UACMP) WITH MICROSCOPIC
Bilirubin Urine: NEGATIVE
Glucose, UA: NEGATIVE mg/dL
Ketones, ur: NEGATIVE mg/dL
Nitrite: NEGATIVE
Protein, ur: 30 mg/dL — AB
Specific Gravity, Urine: 1.018 (ref 1.005–1.030)
Squamous Epithelial / HPF: NONE SEEN (ref 0–5)
WBC, UA: >50 WBC/hpf — ABNORMAL HIGH (ref 0–5)
pH: 5 (ref 5.0–8.0)

## 2020-11-25 LAB — CBC
HCT: 37.1 % (ref 36.0–46.0)
Hemoglobin: 12.3 g/dL (ref 12.0–15.0)
MCH: 29.6 pg (ref 26.0–34.0)
MCHC: 33.2 g/dL (ref 30.0–36.0)
MCV: 89.2 fL (ref 80.0–100.0)
Platelets: 173 10*3/uL (ref 150–400)
RBC: 4.16 MIL/uL (ref 3.87–5.11)
RDW: 13.4 % (ref 11.5–15.5)
WBC: 10.8 10*3/uL — ABNORMAL HIGH (ref 4.0–10.5)
nRBC: 0 % (ref 0.0–0.2)

## 2020-11-25 LAB — COMPREHENSIVE METABOLIC PANEL
ALT: 11 U/L (ref 0–44)
AST: 15 U/L (ref 15–41)
Albumin: 3.9 g/dL (ref 3.5–5.0)
Alkaline Phosphatase: 88 U/L (ref 38–126)
Anion gap: 11 (ref 5–15)
BUN: 12 mg/dL (ref 8–23)
CO2: 27 mmol/L (ref 22–32)
Calcium: 9.1 mg/dL (ref 8.9–10.3)
Chloride: 97 mmol/L — ABNORMAL LOW (ref 98–111)
Creatinine, Ser: 1.24 mg/dL — ABNORMAL HIGH (ref 0.44–1.00)
GFR, Estimated: 42 mL/min — ABNORMAL LOW (ref >60)
Glucose, Bld: 106 mg/dL — ABNORMAL HIGH (ref 70–99)
Potassium: 3.7 mmol/L (ref 3.5–5.1)
Sodium: 135 mmol/L (ref 135–145)
Total Bilirubin: 0.6 mg/dL (ref 0.3–1.2)
Total Protein: 8 g/dL (ref 6.5–8.1)

## 2020-11-25 LAB — DIFFERENTIAL
Abs Immature Granulocytes: 0.04 10*3/uL (ref 0.00–0.07)
Basophils Absolute: 0.1 10*3/uL (ref 0.0–0.1)
Basophils Relative: 1 %
Eosinophils Absolute: 0.1 10*3/uL (ref 0.0–0.5)
Eosinophils Relative: 1 %
Immature Granulocytes: 0 %
Lymphocytes Relative: 9 %
Lymphs Abs: 1 10*3/uL (ref 0.7–4.0)
Monocytes Absolute: 1.1 10*3/uL — ABNORMAL HIGH (ref 0.1–1.0)
Monocytes Relative: 10 %
Neutro Abs: 8.5 10*3/uL — ABNORMAL HIGH (ref 1.7–7.7)
Neutrophils Relative %: 79 %

## 2020-11-25 LAB — PROTIME-INR
INR: 1.1 (ref 0.8–1.2)
Prothrombin Time: 14.2 seconds (ref 11.4–15.2)

## 2020-11-25 LAB — RESP PANEL BY RT-PCR (FLU A&B, COVID) ARPGX2
Influenza A by PCR: NEGATIVE
Influenza B by PCR: NEGATIVE
SARS Coronavirus 2 by RT PCR: POSITIVE — AB

## 2020-11-25 LAB — APTT: aPTT: 29 seconds (ref 24–36)

## 2020-11-25 MED ORDER — SODIUM CHLORIDE 0.9 % IV SOLN: INTRAVENOUS | Status: DC

## 2020-11-25 MED ORDER — SODIUM CHLORIDE 0.9 % IV SOLN
1.0000 g | Freq: Once | INTRAVENOUS | Status: AC
  Administered 2020-11-25: 1 g via INTRAVENOUS
  Filled 2020-11-25: qty 10

## 2020-11-25 MED ORDER — ASPIRIN 81 MG PO CHEW
81.0000 mg | CHEWABLE_TABLET | Freq: Every day | ORAL | Status: DC
  Administered 2020-11-25: 81 mg via ORAL
  Filled 2020-11-25: qty 1

## 2020-11-25 MED ORDER — ACETAMINOPHEN 325 MG PO TABS: 650.0000 mg | ORAL_TABLET | ORAL | Status: DC | PRN

## 2020-11-25 MED ORDER — SODIUM CHLORIDE 0.9 % IV SOLN
1.0000 g | INTRAVENOUS | Status: DC
  Administered 2020-11-26: 1 g via INTRAVENOUS
  Filled 2020-11-25: qty 10
  Filled 2020-11-25: qty 1

## 2020-11-25 MED ORDER — ACETAMINOPHEN 650 MG RE SUPP: 650.0000 mg | RECTAL | Status: DC | PRN

## 2020-11-25 MED ORDER — CLOPIDOGREL BISULFATE 75 MG PO TABS: 75.0000 mg | ORAL_TABLET | Freq: Every day | ORAL | Status: DC

## 2020-11-25 MED ORDER — SODIUM CHLORIDE 0.9% FLUSH
3.0000 mL | Freq: Once | INTRAVENOUS | Status: AC
  Administered 2020-11-25: 3 mL via INTRAVENOUS

## 2020-11-25 MED ORDER — STROKE: EARLY STAGES OF RECOVERY BOOK: Freq: Once | Status: AC

## 2020-11-25 MED ORDER — ATORVASTATIN CALCIUM 20 MG PO TABS
80.0000 mg | ORAL_TABLET | Freq: Every day | ORAL | Status: DC
  Administered 2020-11-26: 80 mg via ORAL
  Filled 2020-11-25: qty 4

## 2020-11-25 MED ORDER — IOHEXOL 350 MG/ML SOLN
60.0000 mL | Freq: Once | INTRAVENOUS | Status: AC | PRN
  Administered 2020-11-25: 60 mL via INTRAVENOUS

## 2020-11-25 MED ORDER — ACETAMINOPHEN 160 MG/5ML PO SOLN
650.0000 mg | ORAL | Status: DC | PRN
  Filled 2020-11-25: qty 20.3

## 2020-11-25 NOTE — ED Notes (Signed)
Rn assisted pt to toilet then back to recliner.

## 2020-11-25 NOTE — H&P (Signed)
History and Physical    [name] PPI:[id number] DOB: 11/17/1929 DOA: 11/25/2020  PCP: Enid Baas, MD    Patient coming from:  Home    Chief Complaint:  Slurred speech.   HPI: Madison Brown is a 87 y.o. female seen in ed with complaints of Slurred speech and off balance  today morning. Last night pt went to bed at baseline and about 4 am she was not herself and had slurred speech and off balance. Pt reported to be leaning to the right side. Both daughter at bedside. Pt lives with spouse and at about 4 am when she got up to use restroom she was weak on right side and  knocked things down  by leaning on them  due to lack of balance.   Pt has past medical history of CAD,HTN,Hyperlipidemia. ED Course:  Vitals:   11/25/20 1054 11/25/20 1552 11/25/20 1737 11/25/20 2101  BP: (!) 164/74 138/90 (!) 158/74 (!) 168/79  Pulse: (!) 115 (!) 105 98 98  Resp: 18 16 16 18   Temp: 99.4 F (37.4 C) (!) 100.6 F (38.1 C) 98.8 F (37.1 C) 99.5 F (37.5 C)  TempSrc: Oral Oral Oral Oral  SpO2: 98% 100% 97% 99%  In ed Pt is a/ox 3 and then developed fever and  meet sepsis criteria and u/a was found to have cloudy urine with large leucocytes and nitrite negative with more than 50 wbc.  Culture obtained.  EKG shows : Sinus tachycardia at 112, wide qrs and lad, no st changes.  CTA negative for acute abnormality.  Patient now found to be febrile to 100.6.  MRI shows small acute and subacute stroke. Cmp shows  AKI of 1.24 which is at baseline. Cbc shows wbc of 10.8 norma hb of 12.3 and platelet count of 173.   Review of Systems:  Review of Systems  Neurological:  Positive for speech change and focal weakness.  All other systems reviewed and are negative.  Past Medical History:  Diagnosis Date   Coronary artery disease    Dr. Gwen Pounds   Past Surgical History:  Procedure Laterality Date   CORONARY ARTERY BYPASS GRAFT  2009   Dr. Zebedee Iba at [facility]   VAGINAL DELIVERY     3      reports that she has never smoked. She has never used smokeless tobacco. She reports that she does not drink alcohol and does not use drugs.  No Known Allergies  History reviewed. No pertinent family history.  Prior to Admission medications   Medication Sig Start Date End Date Taking? Authorizing Provider  AMLODIPINE BESYLATE PO Take 5 mg by mouth daily.     [provider]  clopidogrel (PLAVIX) 75 MG tablet Take 75 mg by mouth daily.    [provider]  pravastatin (PRAVACHOL) 20 MG tablet Take 20 mg by mouth daily.  08/29/12   [provider]    Physical Exam: Vitals:   11/25/20 1054 11/25/20 1552 11/25/20 1737 11/25/20 2101  BP: (!) 164/74 138/90 (!) 158/74 (!) 168/79  Pulse: (!) 115 (!) 105 98 98  Resp: 18 16 16 18   Temp: 99.4 F (37.4 C) (!) 100.6 F (38.1 C) 98.8 F (37.1 C) 99.5 F (37.5 C)  TempSrc: Oral Oral Oral Oral  SpO2: 98% 100% 97% 99%   Physical Exam Vitals reviewed.  Constitutional:      General: She is not in acute distress.    Appearance: She is not ill-appearing.  HENT:  Head: Normocephalic and atraumatic.     Right Ear: External ear normal.     Left Ear: External ear normal.     Nose: Nose normal.     Mouth/Throat:     Mouth: Mucous membranes are moist.  Eyes:     General: No visual field deficit.    Extraocular Movements: Extraocular movements intact.     Pupils: Pupils are equal, round, and reactive to light.  Neck:     Vascular: No carotid bruit.  Cardiovascular:     Rate and Rhythm: Normal rate and regular rhythm.     Pulses: Normal pulses.     Heart sounds: Normal heart sounds.  Pulmonary:     Effort: Pulmonary effort is normal.     Breath sounds: Normal breath sounds.  Abdominal:     General: Bowel sounds are normal. There is no distension.     Palpations: Abdomen is soft. There is no mass.     Tenderness: There is no abdominal tenderness. There is no guarding.     Hernia: No hernia is present.   Musculoskeletal:     Right lower leg: No edema.     Left lower leg: No edema.  Skin:    Coloration: Skin is not jaundiced.  Neurological:     General: No focal deficit present.     Mental Status: She is alert and oriented to person, place, and time.     Cranial Nerves: Cranial nerves are intact. No cranial nerve deficit, dysarthria or facial asymmetry.     Motor: No weakness.     Coordination: Finger-Nose-Finger Test abnormal.     Deep Tendon Reflexes:     Reflex Scores:      Bicep reflexes are 2+ on the right side and 2+ on the left side.      Patellar reflexes are 2+ on the right side and 2+ on the left side.    Comments: Finger nose abnormal on rt side and no drift on exam.    Psychiatric:        Mood and Affect: Mood normal.        Behavior: Behavior normal.   Labs on Admission: I have personally reviewed following labs and imaging studies  No results for input(s): CKTOTAL, CKMB, TROPONINI in the last 72 hours. Lab Results  Component Value Date   WBC 10.8 (H) 11/25/2020   HGB 12.3 11/25/2020   HCT 37.1 11/25/2020   MCV 89.2 11/25/2020   PLT 173 11/25/2020    Recent Labs  Lab 11/25/20 1056  NA 135  K 3.7  CL 97*  CO2 27  BUN 12  CREATININE 1.24*  CALCIUM 9.1  PROT 8.0  BILITOT 0.6  ALKPHOS 88  ALT 11  AST 15  GLUCOSE 106*   Lab Results  Component Value Date   CHOL 180 02/23/2017   HDL 63.70 02/23/2017   LDLCALC 97 02/23/2017   TRIG 100.0 02/23/2017   No results found for: DDIMER Invalid input(s): POCBNP  Urinalysis    Component Value Date/Time   COLORURINE YELLOW (A) 11/25/2020 1729   APPEARANCEUR CLOUDY (A) 11/25/2020 1729   LABSPEC 1.018 11/25/2020 1729   PHURINE 5.0 11/25/2020 1729   GLUCOSEU NEGATIVE 11/25/2020 1729   GLUCOSEU NEGATIVE 08/14/2016 1029   HGBUR SMALL (A) 11/25/2020 1729   BILIRUBINUR NEGATIVE 11/25/2020 1729   BILIRUBINUR neg 12/31/2011 1501   KETONESUR NEGATIVE 11/25/2020 1729   PROTEINUR 30 (A) 11/25/2020 1729    UROBILINOGEN 0.2 08/14/2016 1029  NITRITE NEGATIVE 11/25/2020 1729   LEUKOCYTESUR LARGE (A) 11/25/2020 1729    COVID-19 Labs No results for input(s): DDIMER, FERRITIN, LDH, CRP in the last 72 hours. Lab Results  Component Value Date   SARSCOV2NAA POSITIVE (A) 11/25/2020    Radiological Exams on Admission: CT ANGIO HEAD NECK W WO CM  Result Date: 11/25/2020 CLINICAL DATA:  Neuro deficit, acute, stroke suspected; right sided deficits EXAM: CT ANGIOGRAPHY HEAD AND NECK TECHNIQUE: Multidetector CT imaging of the head and neck was performed using the standard protocol during bolus administration of intravenous contrast. Multiplanar CT image reconstructions and MIPs were obtained to evaluate the vascular anatomy. Carotid stenosis measurements (when applicable) are obtained utilizing NASCET criteria, using the distal internal carotid diameter as the denominator. CONTRAST:  68mL OMNIPAQUE IOHEXOL 350 MG/ML SOLN COMPARISON:  None. FINDINGS: CTA NECK Aortic arch: Calcified and irregular noncalcified plaque along the arch. Patent great vessel origins. Plaque without high-grade proximal subclavian stenosis. Right carotid system: Patent. Calcified plaque along the proximal internal carotid with less than 50% stenosis. Left carotid system: Patent. Mixed but primarily calcified plaque along the proximal internal carotid with less than 50% stenosis. Vertebral arteries: Patent.  Right vertebral artery is dominant. Skeleton: Degenerative changes of the cervical spine. Degenerative changes of the temporomandibular joints. Other neck: Unremarkable. Upper chest: Included upper lungs are clear. Review of the MIP images confirms the above findings CTA HEAD Anterior circulation: Intracranial internal carotid arteries are patent with calcified plaque but no significant stenosis. Anterior and middle cerebral arteries are patent. There is mild to moderate stenoses of the proximal right M1 MCA and distal left M1 MCA. Moderate  stenosis of the distal right M1 MCA. Additional mild M2 MCA stenoses. Posterior circulation: Intracranial vertebral arteries are patent. Basilar artery is patent. Moderate to marked stenosis of the mid basilar. Major cerebellar artery origins are patent. Bilateral posterior communicating arteries are present. Posterior cerebral arteries are patent. There is moderate stenosis of the left P2 PCA. Diminished visualization of distal PCA branches related to infarcts. Venous sinuses: Patent as allowed by contrast bolus timing. Review of the MIP images confirms the above findings IMPRESSION: No large vessel occlusion. Plaque at the proximal internal carotids causes less than 50% stenosis. Multifocal intracranial atherosclerosis. Electronically Signed   By: Guadlupe Spanish M.D.   On: 11/25/2020 16:39   DG Chest 2 View  Result Date: 11/25/2020 CLINICAL DATA:  Fever, weakness EXAM: CHEST - 2 VIEW COMPARISON:  09/22/2010 FINDINGS: There is hyperinflation of the lungs compatible with COPD. Prior CABG. Heart and mediastinal contours are within normal limits. No focal opacities or effusions. No acute bony abnormality. IMPRESSION: COPD.  No active disease. Electronically Signed   By: Charlett Nose M.D.   On: 11/25/2020 17:32   CT HEAD WO CONTRAST  Result Date: 11/25/2020 CLINICAL DATA:  Possible stroke EXAM: CT HEAD WITHOUT CONTRAST TECHNIQUE: Contiguous axial images were obtained from the base of the skull through the vertex without intravenous contrast. COMPARISON:  Brain MRI 09/23/2010, CT head 09/04/2020 FINDINGS: Brain: There is hypodensity in the left PCA territory consistent with evolving early chronic infarct, as seen on CT head of 09/04/2020. Small foci of hyperdensity within the infarct territory may reflect cortical laminar necrosis. An additional remote infarct in the right parietal lobe is unchanged. There is no evidence of new acute infarct. There is no acute intracranial hemorrhage or extra-axial fluid  collection. There is unchanged global parenchymal volume loss. Hypodensity throughout the remainder of the subcortical and periventricular white  matter likely reflects sequela of chronic white matter microangiopathy. There is no mass lesion.  There is no midline shift. Vascular: There is calcification of the bilateral cavernous ICAs. Skull: Normal. Negative for fracture or focal lesion. Sinuses/Orbits: Bilateral lens implants are in place. The globes and orbits are otherwise unremarkable. Other: A mildly hyperdense lesion in the left parotid gland is incompletely imaged but was present in 2012 and may reflect a lymph node. IMPRESSION: 1. No acute intracranial hemorrhage or infarct. 2. Expected interval evolution of the left occipital lobe infarct since 09/04/2020. 3. Unchanged remote right occipital infarct, global parenchymal volume loss, and chronic white matter microangiopathy. Electronically Signed   By: Lesia Hausen M.D.   On: 11/25/2020 12:48   MR BRAIN WO CONTRAST  Result Date: 11/25/2020 CLINICAL DATA:  Neuro deficit, acute, stroke suspected. Right-sided deficits. EXAM: MRI HEAD WITHOUT CONTRAST TECHNIQUE: Multiplanar, multiecho pulse sequences of the brain and surrounding structures were obtained without intravenous contrast. COMPARISON:  CT studies earlier same day.  CT 09/04/2020 FINDINGS: Brain: Mild chronic small-vessel ischemic change affects the pons. There are numerous old small vessel cerebellar infarctions affecting both hemispheres. Late subacute infarction of the left occipital lobe with evolutionary changes. No evidence of extension of the insult. There is been old infarction in the right occipital lobe. Punctate acute infarction in the right parietal cortical and subcortical brain without mass effect or hemorrhage. No evidence of acute hemorrhage, hydrocephalus or extra-axial fluid collection. Minimal petechial blood products in the old and subacute occipital infarctions. Chronic  small-vessel ischemic change of the white matter, some of the insults being associated with petechial blood products. Vascular: Major vessels at the base of the brain show flow. Skull and upper cervical spine: Negative Sinuses/Orbits: Clear/normal Other: None IMPRESSION: Late subacute infarction of the left occipital lobe. Old infarction of the right occipital lobe. Acute subcentimeter infarction affecting the right posterior parietal lobe. Numerous old small vessel infarctions affecting both cerebellar hemispheres. Chronic small-vessel ischemic changes of the pons and cerebral hemispheric white matter. Electronically Signed   By: Paulina Fusi M.D.   On: 11/25/2020 18:12    EKG: Independently reviewed.  S.Tach and LVH.  Echocardiogram: Pending.   Assessment/Plan Principal Problem:   Slurred speech Active Problems:   Acute CVA (cerebrovascular accident) (HCC)   COVID-19 virus infection   Essential hypertension, benign   Coronary artery arteriosclerosis    Slurred Speech/ A/C cva: Attribute to acute right parietal lobe stroke.  We will add asa 81 mg and cont Plavix 75 mg- DAPT for 21 days then plavix alone unless  Otherwise specified by neurologist.  Increase Lipitor to 80 mg. CTA negative for any LVO. Mri shows right parietal lobe acute cva.  We will obtain echo and follow. Neurology consult per am team.  D/W pt and family that is probably from Covid.  Fall/ aspiration precaution and up with assistance. PT/OT.    Covid -19: We will place pt is covid isolation. Consider remdesivir but risk vs benefit esp  liver and renal failure and her age I feel, We can monitor and start remdesivir if pt becomes symptomatic in any way, as we donot have a timeline and know how long pt has had it . Currently minor cough.   Htn: Blood pressure (!) 168/79, pulse 98, temperature 99.5 F (37.5 C), temperature source Oral, resp. rate 18, SpO2 99 %. D/w family about permissive htn and holding  amlodipine.    CAD: Cont statin/ asa/ plavix/    DVT prophylaxis:  Heparin   Code Status:  Full Code    Family Communication:  Laymond Purser (Spouse)  [phone] (Mobile)   Disposition Plan:  TBD   Consults called:  None   Admission status: Inpatient./     Gertha Calkin MD [facility] [phone] How to contact the [facility] Attending or Consulting provider 7A - 7P or covering provider during after hours 7P -7A, for this patient.    Check the care team in [facility] and look for a) attending/consulting [facility] provider listed and b) the [facility] team listed Log into [email or web address] and use [facility] universal password to access. If you do not have the password, please contact the hospital operator. Locate the [facility] provider you are looking for under [facility] and page to a number that you can be directly reached. If you still have difficulty reaching the provider, please page the [facility] (Director on Call) for the Hospitalists listed on amion for assistance. [email or web address] Password [email or web address] 11/25/2020, 9:35 PM

## 2020-11-25 NOTE — ED Provider Notes (Signed)
[facility] Emergency Department Provider Note  Time seen: 3:49 PM  I have reviewed the triage vital signs and the nursing notes.   HISTORY  Chief Complaint Stroke Symptoms   HPI Madison Brown is a 87 y.o. female with a past medical history of 2 prior strokes hypertension, presents to the emergency department for right-sided deficits and speech difficulties.  According to the daughter patient lives at home, they noted last night that the patient seemed to be slurring her speech and today they noticed that the patient is leaning to the right.  She states normally the patient is able to walk although with some assistance such as holding her arm when she is walking.  Today she is unable to walk.  Patient is leaning to the right in the wheelchair which the daughter states is abnormal as well.  Patient is awake and alert and able to give a decent history.  Denies any headache.  Denies feeling weak in any arm or leg however she continues to slouch to the right even when placed upright.  Patient takes Plavix.   Past Medical History:  Diagnosis Date   Coronary artery disease    Dr. Gwen Pounds    Patient Active Problem List   Diagnosis Date Noted   Underweight 02/23/2017   Essential hypertension, benign 03/30/2013   Other and unspecified hyperlipidemia 03/30/2013   Encounter for preventive health examination 10/06/2012   Coronary artery arteriosclerosis 06/22/2011    Past Surgical History:  Procedure Laterality Date   CORONARY ARTERY BYPASS GRAFT  2009   Dr. Zebedee [facility]ba at [facility]   VAGINAL DELIVERY     3    Prior to Admission medications   Medication Sig Start Date End Date Taking? Authorizing Provider  AMLODIPINE BESYLATE PO Take 5 mg by mouth daily.     [provider]  clopidogrel (PLAVIX) 75 MG tablet Take 75 mg by mouth daily.    [provider]  pravastatin (PRAVACHOL) 20 MG tablet Take 20 mg by mouth daily.  08/29/12   [provider]     No Known Allergies  No family history on file.  Social History Social History   Tobacco Use   Smoking status: Never   Smokeless tobacco: Never  Substance Use Topics   Alcohol use: No   Drug use: No    Review of Systems Constitutional: Negative for fever. Cardiovascular: Negative for chest pain. Respiratory: Negative for shortness of breath. Gastrointestinal: Negative for abdominal pain, vomiting and diarrhea. Musculoskeletal: Negative for musculoskeletal complaints Neurological: Negative for headache All other ROS negative  ____________________________________________   PHYSICAL EXAM:  VITAL SIGNS: ED Triage Vitals  Enc Vitals Group     BP 11/25/20 1054 (!) 164/74     Pulse Rate 11/25/20 1054 (!) 115     Resp 11/25/20 1054 18     Temp 11/25/20 1054 99.4 F (37.4 C)     Temp Source 11/25/20 1054 Oral     SpO2 11/25/20 1054 98 %     Weight --      Height --      Head Circumference --      Peak Flow --      Pain Score 11/25/20 1050 0     Pain Loc --      Pain Edu? --      Excl. in GC? --    Constitutional: Alert and oriented. Well appearing and in no distress. Eyes: Normal exam ENT  Head: Normocephalic and atraumatic.      Mouth/Throat: Mucous membranes are moist. Cardiovascular: Normal rate, regular rhythm. Respiratory: Normal respiratory effort without tachypnea nor retractions. Breath sounds are clear  Gastrointestinal: Soft and nontender. No distention.   Musculoskeletal: Nontender with normal range of motion in all extremities. Neurologic: Appears to have clear speech and normal language currently.  Patient leans to the right although has equal grip strengths.  Does have slight difficulty keeping the right leg elevated more so than the left leg.  Has trouble fully extending her right upper extremity.  No obvious cranial nerve deficit. Skin:  Skin is warm, dry and intact.  Psychiatric: Mood and affect are normal.    ____________________________________________    EKG  EKG viewed and interpreted by myself shows sinus tachycardia 112 bpm with a widened QRS, left axis deviation, largely normal intervals, nonspecific ST changes somewhat depressed ST segments and lateral leads.  ____________________________________________    RADIOLOGY  No acute CVA on CT  ____________________________________________   INITIAL IMPRESSION / ASSESSMENT AND PLAN / ED COURSE  Pertinent labs & imaging results that were available during my care of the patient were reviewed by me and considered in my medical decision making (see chart for details).   Patient presents to the emergency department with a history of 2 prior CVAs.  Patient had some speech difficulties last night and is now noted to be leaning to the right.  On my examination patient is leaning to the right, unable to fully extend right upper extremity and appears to be slightly weak in the right lower extremity as well, no obvious cranial nerve deficits.  Given the new symptoms worsening since last night highly suspect CVA.  CT scan of the head is negative we will obtain CTA imaging of the head and neck to rule out large vessel occlusion.  If CTA is negative we will obtain MRI.  Anticipate likely admission to the hospital given the patient's new onset weakness and inability to ambulate and right-sided deficits.  CTA negative for acute abnormality.  Patient now found to be febrile to 100.6.  COVID swab pending.  Urinalysis pending.  White blood cell count of 10.8.  Heart rate of 105.  Patient will require admission to the hospitalist regardless.  MRI shows small acute and subacute stroke.  Urinalysis consistent with fairly significant urinary tract infection.  Blood culture sent the patient started on IV Rocephin.  Patient mated to the hospital service for further work-up and treatment.  Madison Brown was evaluated in Emergency Department on 11/25/2020 for the  symptoms described in the history of present illness. She was evaluated in the context of the global COVID-19 pandemic, which necessitated consideration that the patient might be at risk for infection with the SARS-CoV-2 virus that causes COVID-19. Institutional protocols and algorithms that pertain to the evaluation of patients at risk for COVID-19 are in a state of rapid change based on information released by regulatory bodies including the CDC and federal and state organizations. These policies and algorithms were followed during the patient's care in the ED.   NIH Stroke Scale   Interval: Baseline Time: 3:54 PM Person Administering Scale: Minna Antis  Administer stroke scale items in the order listed. Record performance in each category after each subscale exam. Do not go back and change scores. Follow directions provided for each exam technique. Scores should reflect what the patient does, not what the clinician thinks the patient can do. The clinician should record answers while  administering the exam and work quickly. Except where indicated, the patient should not be coached (i.e., repeated requests to patient to make a special effort).   1a  Level of consciousness: 0=alert; keenly responsive  1b. LOC questions:  0=Performs both tasks correctly  1c. LOC commands: 0=Performs both tasks correctly  2.  Best Gaze: 0=normal  3.  Visual: 0=No visual loss  4. Facial Palsy: 0=Normal symmetric movement  5a.  Motor left arm: 0  5b.  Motor right arm: 1=Drift, limb holds 90 (or 45) degrees but drifts down before full 10 seconds: does not hit bed  6a. motor left leg: 0=No drift, limb holds 90 (or 45) degrees for full 10 seconds  6b  Motor right leg:  1=Drift, limb holds 90 (or 45) degrees but drifts down before full 10 seconds: does not hit bed  7. Limb Ataxia: 1=Present in one limb  8.  Sensory: 0=Normal; no sensory loss  9. Best Language:  0=No aphasia, normal  10. Dysarthria: 0=Normal   11. Extinction and Inattention: 0=No abnormality  12. Distal motor function: 1   Total:   4   ____________________________________________   FINAL CLINICAL IMPRESSION(S) / ED DIAGNOSES  CVA Urinary tract infection   Minna Antis, MD 11/25/20 [phone]

## 2020-11-25 NOTE — ED Notes (Signed)
Pt's vitals rechecked at this time. Pt leaning to the right and right gaze. Charge RN informed and MD Pad to assess pt.

## 2020-11-25 NOTE — ED Triage Notes (Signed)
C/O unable to walk, slurred speech -- woke up this way this morning  Went to bed last night at 2230.  Per patient's daughter, patient's husband reports patient waking up around 0400 and was just "standing there, something wasn't right"  Patient is AAOx3.  Skin warm and dry. NAD. Speech clear.  MAE. NAD

## 2020-11-25 NOTE — ED Notes (Signed)
MD Patel at bedside.

## 2020-11-25 NOTE — ED Notes (Signed)
Pt lying semi fowlers in a recliner chair with family at bedside. A/ox3. Per family, pt has dementia HX but this morning was seen by family to not be able to stand up on her own like she normally can. LNWT last noc. Pt denies SOB, CP, ABD pain, n/v/d, fever or GU symptoms. See NIHSS scale.

## 2020-11-25 NOTE — ED Notes (Signed)
MD Pad to place new orders at this time. IV to be placed and pt taken to subwait area.

## 2020-11-25 NOTE — ED Triage Notes (Addendum)
Pt come with c/o possible stroke. Daughter reports pt was fine all day yesterday. Pt last normal with husband around 2200. Pt woke up around 4am and husband states she was just standing there and not acting right.   Daughter arrived to house at [facility] and states she noticed slurred speech and pt appeared weak and off balance.  Pt answering questions speech clear. Pt is unsteady. Pt does have noticeable right sided drift with arm.

## 2020-11-26 ENCOUNTER — Inpatient Hospital Stay (HOSPITAL_COMMUNITY)
Admit: 2020-11-26 | Discharge: 2020-11-26 | Disposition: A | Discharge status: Home / Self Care | Payer: Medicare Other | Attending: Internal Medicine | Admitting: Internal Medicine

## 2020-11-26 DIAGNOSIS — Z953 Presence of xenogenic heart valve: Secondary | ICD-10-CM

## 2020-11-26 DIAGNOSIS — I6389 Other cerebral infarction: Secondary | ICD-10-CM

## 2020-11-26 DIAGNOSIS — R4781 Slurred speech: Secondary | ICD-10-CM

## 2020-11-26 DIAGNOSIS — Z951 Presence of aortocoronary bypass graft: Secondary | ICD-10-CM

## 2020-11-26 DIAGNOSIS — F0390 Unspecified dementia without behavioral disturbance: Secondary | ICD-10-CM | POA: Diagnosis present

## 2020-11-26 LAB — LIPID PANEL
Cholesterol: 120 mg/dL (ref 0–200)
HDL: 47 mg/dL (ref >40)
LDL Cholesterol: 62 mg/dL (ref 0–99)
Total CHOL/HDL Ratio: 2.6 RATIO
Triglycerides: 56 mg/dL (ref <150)
VLDL: 11 mg/dL (ref 0–40)

## 2020-11-26 LAB — ECHOCARDIOGRAM COMPLETE
AR max vel: 1.74 cm2
AV Area VTI: 2.18 cm2
AV Area mean vel: 1.68 cm2
AV Mean grad: 6.3 mmHg
AV Peak grad: 11.7 mmHg
Ao pk vel: 1.71 m/s
Area-P 1/2: 6.83 cm2
Height: 63 in
MV VTI: 2.66 cm2
S' Lateral: 1.9 cm
Weight: 1440 oz

## 2020-11-26 MED ORDER — CLOPIDOGREL BISULFATE 75 MG PO TABS
75.0000 mg | ORAL_TABLET | Freq: Every day | ORAL | Status: DC
  Administered 2020-11-27 – 2020-12-06 (×10): 75 mg via ORAL
  Filled 2020-11-26 (×10): qty 1

## 2020-11-26 MED ORDER — CLOPIDOGREL BISULFATE 75 MG PO TABS
300.0000 mg | ORAL_TABLET | Freq: Once | ORAL | Status: AC
  Administered 2020-11-26: 12:00:00 300 mg via ORAL
  Filled 2020-11-26: qty 4

## 2020-11-26 MED ORDER — ASPIRIN EC 325 MG PO TBEC
325.0000 mg | DELAYED_RELEASE_TABLET | Freq: Once | ORAL | Status: AC
  Administered 2020-11-26: 12:00:00 325 mg via ORAL
  Filled 2020-11-26: qty 1

## 2020-11-26 MED ORDER — ASPIRIN 81 MG PO CHEW
81.0000 mg | CHEWABLE_TABLET | Freq: Every day | ORAL | Status: DC
  Administered 2020-11-27 – 2020-12-06 (×10): 81 mg via ORAL
  Filled 2020-11-26 (×10): qty 1

## 2020-11-26 MED ORDER — ATORVASTATIN CALCIUM 20 MG PO TABS
40.0000 mg | ORAL_TABLET | Freq: Every day | ORAL | Status: DC
  Administered 2020-11-27 – 2020-12-06 (×10): 40 mg via ORAL
  Filled 2020-11-26 (×10): qty 2

## 2020-11-26 NOTE — Progress Notes (Signed)
*  PRELIMINARY RESULTS* Echocardiogram 2D Echocardiogram has been performed.  Cristela Blue 11/26/2020, 10:06 AM

## 2020-11-26 NOTE — TOC Initial Note (Signed)
Transition of Care [facility]) - Initial/Assessment Note    Patient Details  Name: Madison Brown MRN: [id number] Date of Birth: 1929/12/21  [facility]) CM/SW Contact:    Caryn Section, RN Phone Number: 11/26/2020, 3:38 PM  Clinical Narrative:   Patient was in examination upon assessment, notes to follow.  [facility] is currently recommended for disposition, they will evaluate patient and notify TOC with decision.   TOC will revisit with patient when she is available. TOC contact information provided, TOC will follow to discharge.                Expected Discharge Plan: IP Rehab Facility (CIR recommended and will evaluate)     Patient Goals and CMS Choice        Expected Discharge Plan and Services Expected Discharge Plan: IP Rehab Facility (CIR recommended and will evaluate)     Post Acute Care Choice:  (TBD)                                        Prior Living Arrangements/Services     Patient language and need for interpreter reviewed:: Yes        Need for Family Participation in Patient Care: Yes (Comment) Care giver support system in place?: Yes (comment)   Criminal Activity/Legal Involvement Pertinent to Current Situation/Hospitalization: No - Comment as needed  Activities of Daily Living Home Assistive Devices/Equipment: None ADL Screening (condition at time of admission) Patient's cognitive ability adequate to safely complete daily activities?: Yes Is the patient deaf or have difficulty hearing?: No Does the patient have difficulty seeing, even when wearing glasses/contacts?: No Does the patient have difficulty concentrating, remembering, or making decisions?: No Patient able to express need for assistance with ADLs?: Yes Does the patient have difficulty dressing or bathing?: No Independently performs ADLs?: Yes (appropriate for developmental age) Does the patient have difficulty walking or climbing stairs?:  Yes Weakness of Legs: Both Weakness of Arms/Hands: None  Permission Sought/Granted Permission sought to share information with : Case Manager Permission granted to share information with : Yes, Verbal Permission Granted     Permission granted to share info w AGENCY: [facility]        Emotional Assessment Appearance::  (Spoke to granddaughter, patient seeing neurologist.) Attitude/Demeanor/Rapport:  (Spoke to granddaughter, patient seeing neurologist) Affect (typically observed):  (Patient in examination) Orientation: : Oriented to Self, Oriented to Place, Oriented to  Time, Oriented to Situation Alcohol / Substance Use: Not Applicable Psych Involvement: No (comment)  Admission diagnosis:  Slurred speech [R47.81] Urinary tract infection without hematuria, site unspecified [N39.0] Cerebrovascular accident (CVA), unspecified mechanism (HCC) [I63.9] Patient Active Problem List   Diagnosis Date Noted   Hx of CABG 11/26/2020   S/P aortic valve replacement with bioprosthetic valve 11/26/2020   Dementia (HCC) 11/26/2020   TIA (transient ischemic attack) 11/25/2020   Acute CVA (cerebrovascular accident) (HCC) 11/25/2020   COVID-19 virus infection 11/25/2020   Slurred speech 11/25/2020   Underweight 02/23/2017   Essential hypertension, benign 03/30/2013   Other and unspecified hyperlipidemia 03/30/2013   Encounter for preventive health examination 10/06/2012   CAD (coronary artery disease) 06/22/2011   PCP:  Enid Baas, MD Pharmacy:   [facility] [address], [place] - [address] [place] [address] Phone: [phone] Fax: [phone]  [facility] [id number] [place], [place] -  [address] [place] [address] Phone: [phone] Fax: [phone]     Social Determinants of Health (SDOH) Interventions    Readmission Risk Interventions No flowsheet data found.

## 2020-11-26 NOTE — Evaluation (Signed)
Occupational Therapy Evaluation Patient Details Name: Madison Brown MRN: [id number] DOB: 1929/05/13 Today's Date: 11/26/2020   History of Present Illness 87 y.o. female who presents with slurred speech and balance difficulties. MRI brain revealed: acute infarction affecting the right posterior parietal lobe, subacute infarction of the left occipital lobe, old infarction of the right occipital lobe, and numerous old small vessel infarctions affecting both cerebellar hemispheres. Pt has past medical history of CAD, HTN, and Hyperlipidemia   Clinical Impression   Pt seen for OT evaluation this date. Upon arrival to room, pt asleep in bed with daughter-in-law at bedside. Pt easily awoken and reporting no pain. Pt A&Ox1 this date (pt's family reporting that pt is typically A&Ox3). PLOF/Home set-up obtained from family at bedside d/t pt's impaired cognition. Prior to admission, pt was living in a 1-story home with husband. At baseline, pt is able to walk community distances with MIN GUARD (refuses to use AD/DME) and requires some assist for ADLs d/t impaired peripheral vision. Pt currently presents with impaired cognition, decreased strength, and decreased balance. Due to these functional impairments, pt requires SUPERVISION/SET-UP for seated grooming tasks, MOD-MAX A for sit>stand LB ADLs, and MAX A for stand pivot transfers bed>recliner. Pt would benefit from additional skilled OT services to maximize return to PLOF and minimize risk of future falls, injury, caregiver burden, and readmission. Upon discharge, recommend SNF.         Recommendations for follow up therapy are one component of a multi-disciplinary discharge planning process, led by the attending physician.  Recommendations may be updated based on patient status, additional functional criteria and insurance authorization.   Follow Up Recommendations  SNF;Supervision/Assistance - 24 hour    Equipment Recommendations  Other (comment);3 in 1  bedside commode (2ww)       Precautions / Restrictions Precautions Precautions: Fall Restrictions Weight Bearing Restrictions: No      Mobility Bed Mobility Overal bed mobility: Needs Assistance Bed Mobility: Supine to Sit     Supine to sit: Min assist;HOB elevated     General bed mobility comments: MIN A to initiate transfer by bringing LE towards EOB    Transfers Overall transfer level: Needs assistance Equipment used: None Transfers: Sit to/from [employer] Sit to Stand: Mod assist Stand pivot transfers: Max assist       General transfer comment: MAX A for stand pivot transfer via bilateral UE support. Pt requires verbal cues for sequencing    Balance Overall balance assessment: Needs assistance Sitting-balance support: No upper extremity supported;Feet supported Sitting balance-Leahy Scale: Good Sitting balance - Comments: Good static sitting balance at EOB during seated grooming tasks   Standing balance support: Bilateral upper extremity supported;During functional activity Standing balance-Leahy Scale: Poor Standing balance comment: Requires MOD A to maintain static standing balance                           ADL either performed or assessed with clinical judgement   ADL Overall ADL's : Needs assistance/impaired                                       General ADL Comments: SUPERVISION/SET-UP for seated grooming tasks at EOB. Requires MOD-MAX A for sit>stand LB ADLs     Vision   Additional Comments: Difficult to formally assess pt's vision in setting of impaired cognition, however family reporting that pt  has baseline peripheral vision loss            Pertinent Vitals/Pain Pain Assessment: No/denies pain        Extremity/Trunk Assessment Upper Extremity Assessment Upper Extremity Assessment: Generalized weakness;Difficult to assess due to impaired cognition (Difficult to assess d/t pt refusing formal UE  assessment, however appears grossly at least 3/5 in all movements)   Lower Extremity Assessment Lower Extremity Assessment: Generalized weakness;Difficult to assess due to impaired cognition          Cognition Arousal/Alertness: Awake/alert Behavior During Therapy: WFL for tasks assessed/performed Overall Cognitive Status: Impaired/Different from baseline                                 General Comments: A&Ox1. Pt's family reporting that pt is typically oriented to self, place, and month/year. Pt with decreased short term memory and attention this date, requiring frequent verbal cues for attending to task              Home Living Family/patient expects to be discharged to:: Private residence Living Arrangements: Spouse/significant other Available Help at Discharge: Family;Available 24 hours/day;Available PRN/intermittently (husband available 24/7. Daughter Publishing copy), 2 sons, and daughter-in-law Brendolyn Patty) available PRN) Type of Home: House Home Access: Stairs to enter Secretary/administrator of Steps: 4 Entrance Stairs-Rails: Left Home Layout: One level     Bathroom Shower/Tub: Chief Strategy Officer: Standard     Home Equipment: Shower seat - built in          Prior Functioning/Environment Level of Independence: Needs assistance  Gait / Transfers Assistance Needed: MIN GUARD for functional mobility without AD. At baseline, pt is able to walk community distances (walked 2 laps around mall this past weekend). Daughter-in-law reports pt has had at least 6-7 falls in past 6 months. ADL's / Homemaking Assistance Needed: Pt requires some assistance for ADLs d/t decreased peripheral vision loss following previous stroke   Comments: Recently d/c from HHPT/HHOT following prior stroke. Daughter-in-law reporting that pt was discharged because was resistent towards recommendations and had difficulty with careover of HEP.        OT Problem List: Decreased  strength;Decreased activity tolerance;Impaired balance (sitting and/or standing);Impaired vision/perception;Decreased safety awareness;Decreased cognition      OT Treatment/Interventions: Self-care/ADL training;Therapeutic exercise;Energy conservation;DME and/or AE instruction;Therapeutic activities;Patient/family education;Balance training    OT Goals(Current goals can be found in the care plan section) Acute Rehab OT Goals Patient Stated Goal: to return home when safe to do so OT Goal Formulation: With family Time For Goal Achievement: 12/10/20 ADL Goals Pt Will Perform Upper Body Bathing: with supervision;with set-up;sitting Pt Will Perform Lower Body Dressing: with min assist;sit to/from stand Pt Will Transfer to Toilet: with min assist;stand pivot transfer;bedside commode  OT Frequency: Min 2X/week    AM-PAC OT "6 Clicks" Daily Activity     Outcome Measure Help from another person eating meals?: None Help from another person taking care of personal grooming?: A Little Help from another person toileting, which includes using toliet, bedpan, or urinal?: A Lot Help from another person bathing (including washing, rinsing, drying)?: A Lot Help from another person to put on and taking off regular upper body clothing?: A Little Help from another person to put on and taking off regular lower body clothing?: A Lot 6 Click Score: 16   End of Session Nurse Communication: Mobility status  Activity Tolerance: Patient tolerated treatment well Patient left: in chair;with  call bell/phone within reach;with chair alarm set;with family/visitor present  OT Visit Diagnosis: Unsteadiness on feet (R26.81);History of falling (Z91.81)                Time: 5366-4403 OT Time Calculation (min): 36 min Charges:  OT General Charges $OT Visit: 1 Visit OT Evaluation $OT Eval Moderate Complexity: 1 Mod OT Treatments $Therapeutic Activity: 23-37 mins Matthew Folks, OTR/L [facility] [phone]

## 2020-11-26 NOTE — Progress Notes (Signed)
Inpatient Rehab Admissions Coordinator:   Per therapy recommendations,  patient was screened for CIR candidacy by Megan Salon, MS, CCC-SLP. At this time, Pt. Appears to demonstrate medical necessity, functional decline, and ability to tolerate intensity of CIR. Pt. is a potential candidate for CIR. I will  place order for rehab consult per protocol for full assessment. Note that Patients are eligible to be considered for admit to the [facility] when cleared from airborne precautions by Acute MD, or Infectious Disease MD.  Otherwise, they will need to be >20 days from their positive test with recovery/improvement in symptoms or 2 negative tests.  AC will reach out to the medical team to determine when pt.'s isolation precautions are to end.   Megan Salon, MS, CCC-SLP Rehab Admissions Coordinator  [phone] (celll) [phone] (office)

## 2020-11-26 NOTE — Progress Notes (Signed)
PROGRESS NOTE    Madison Brown  MVH:[id number] DOB: 1929-03-04 DOA: 11/25/2020 PCP: Enid Baas, MD  Outpatient Specialists: cardiology    Brief Narrative:   Madison Brown is a 87 y.o. female seen in ed with complaints of Slurred speech and off balance  today morning. Last night pt went to bed at baseline and about 4 am she was not herself and had slurred speech and off balance. Pt reported to be leaning to the right side. Both daughter at bedside. Pt lives with spouse and at about 4 am when she got up to use restroom she was weak on right side and  knocked things down  by leaning on them  due to lack of balance.    Assessment & Plan:   Principal Problem:   Slurred speech Active Problems:   CAD (coronary artery disease)   Essential hypertension, benign   Acute CVA (cerebrovascular accident) (HCC)   COVID-19 virus infection   Hx of CABG   S/P aortic valve replacement with bioprosthetic valve   # Subacute CVA Symptoms began early AM of 10/17. MRI shows acute right posterior parietal lobe and subacute left occipital strokes. Passed swallow eval. No events on tele. Covid pos - neuro consulted - tte pending - maintain tele - hob to 30 - pt/ot consults - plavix load 300, then continue plavix - asa 325, then continue 81 daily - cont atorva 80 - f/u a1c   # Covid-19 No respiratory symptoms - holding on paxlovid given need to continue atorvastatin  # Elevated a1c Recent a1c 6.6 - repeat a1c, if 6.5 or higher would make dm cx  # HTN Mild bp elevation - hold home amlodipine, relative permissive htn for now  # History aortic stenosis s/p bioprosthetic aortic valve replacement Appears compensated - TTE as above  # CAD s/p 2-vessel cabg No report of chest pain - statin, aspirin as above  # Dementia No behavioral disturbance  DVT prophylaxis: lovenox Code Status: full, daughter-in-law to address w/ family Family Communication: daughter-in-law updated @  bedside 10/18  Level of care: Med-Surg Status is: Inpatient  Remains inpatient appropriate because: severity of illnes        Consultants:  neurology  Procedures: none  Antimicrobials:  none    Subjective: No complaints this AM  Objective: Vitals:   11/26/20 0350 11/26/20 0422 11/26/20 0632 11/26/20 0740  BP:  (!) 142/73 139/80 (!) 143/74  Pulse:  94  87  Resp:  19  15  Temp:  99 F (37.2 C)  99.1 F (37.3 C)  TempSrc:  Oral  Oral  SpO2: 99% 99%  100%  Weight:      Height:        Intake/Output Summary (Last 24 hours) at 11/26/2020 0949 Last data filed at 11/26/2020 0300 Gross per 24 hour  Intake 60 ml  Output 500 ml  Net -440 ml   Filed Weights   11/26/20 0201  Weight: 40.8 kg    Examination:  General exam: Appears calm and comfortable  Respiratory system: Clear to auscultation. Respiratory effort normal. Cardiovascular system: S1 & S2 heard, mod systolic murmur Gastrointestinal system: Abdomen is nondistended, soft and nontender. No organomegaly or masses felt. Normal bowel sounds heard. Central nervous system: Awake, moves all 4 extremities, slight decreased strength LUE Extremities: no swelling Skin: No rashes, lesions or ulcers Psychiatry: calm    Data Reviewed: I have personally reviewed following labs and imaging studies  CBC: Recent Labs  Lab 11/25/20 1056  WBC 10.8*  NEUTROABS 8.5*  HGB 12.3  HCT 37.1  MCV 89.2  PLT 173   Basic Metabolic Panel: Recent Labs  Lab 11/25/20 1056  NA 135  K 3.7  CL 97*  CO2 27  GLUCOSE 106*  BUN 12  CREATININE 1.24*  CALCIUM 9.1   GFR: Estimated Creatinine Clearance: 19.8 mL/min (A) (by C-G formula based on SCr of 1.24 mg/dL (H)). Liver Function Tests: Recent Labs  Lab 11/25/20 1056  AST 15  ALT 11  ALKPHOS 88  BILITOT 0.6  PROT 8.0  ALBUMIN 3.9   No results for input(s): LIPASE, AMYLASE in the last 168 hours. No results for input(s): AMMONIA in the last 168  hours. Coagulation Profile: Recent Labs  Lab 11/25/20 1056  INR 1.1   Cardiac Enzymes: No results for input(s): CKTOTAL, CKMB, CKMBINDEX, TROPONINI in the last 168 hours. BNP (last 3 results) No results for input(s): PROBNP in the last 8760 hours. HbA1C: No results for input(s): HGBA1C in the last 72 hours. CBG: No results for input(s): GLUCAP in the last 168 hours. Lipid Profile: Recent Labs    11/26/20 0514  CHOL 120  HDL 47  LDLCALC 62  TRIG 56  CHOLHDL 2.6   Thyroid Function Tests: No results for input(s): TSH, T4TOTAL, FREET4, T3FREE, THYROIDAB in the last 72 hours. Anemia Panel: No results for input(s): VITAMINB12, FOLATE, FERRITIN, TIBC, IRON, RETICCTPCT in the last 72 hours. Urine analysis:    Component Value Date/Time   COLORURINE YELLOW (A) 11/25/2020 1729   APPEARANCEUR CLOUDY (A) 11/25/2020 1729   LABSPEC 1.018 11/25/2020 1729   PHURINE 5.0 11/25/2020 1729   GLUCOSEU NEGATIVE 11/25/2020 1729   GLUCOSEU NEGATIVE 08/14/2016 1029   HGBUR SMALL (A) 11/25/2020 1729   BILIRUBINUR NEGATIVE 11/25/2020 1729   BILIRUBINUR neg 12/31/2011 1501   KETONESUR NEGATIVE 11/25/2020 1729   PROTEINUR 30 (A) 11/25/2020 1729   UROBILINOGEN 0.2 08/14/2016 1029   NITRITE NEGATIVE 11/25/2020 1729   LEUKOCYTESUR LARGE (A) 11/25/2020 1729   Sepsis Labs: @LABRCNTIP (procalcitonin:4,lacticidven:4)  ) Recent Results (from the past 240 hour(s))  Resp Panel by RT-PCR (Flu A&B, Covid) Urine, Clean Catch     Status: Abnormal   Collection Time: 11/25/20  5:29 PM   Specimen: Urine, Clean Catch; Nasopharyngeal(NP) swabs in vial transport medium  Result Value Ref Range Status   SARS Coronavirus 2 by RT PCR POSITIVE (A) NEGATIVE Final    Comment: RESULT CALLED TO, READ BACK BY AND VERIFIED WITH: MATT BASSETT @1954  ON 11/25/20 SKL (NOTE) SARS-CoV-2 target nucleic acids are DETECTED.  The SARS-CoV-2 RNA is generally detectable in upper respiratory specimens during the acute phase of  infection. Positive results are indicative of the presence of the identified virus, but do not rule out bacterial infection or co-infection with other pathogens not detected by the test. Clinical correlation with patient history and other diagnostic information is necessary to determine patient infection status. The expected result is Negative.  Fact Sheet for Patients: BloggerCourse.com  Fact Sheet for Healthcare Providers: SeriousBroker.it  This test is not yet approved or cleared by the [place] FDA and  has been authorized for detection and/or diagnosis of SARS-CoV-2 by FDA under an Emergency Use Authorization (EUA).  This EUA will remain in effect (meaning this test can b e used) for the duration of  the COVID-19 declaration under Section 564(b)(1) of the Act, 21 U.S.C. section 360bbb-3(b)(1), unless the authorization is terminated or revoked sooner.     Influenza A by PCR NEGATIVE  NEGATIVE Final   Influenza B by PCR NEGATIVE NEGATIVE Final    Comment: (NOTE) The Xpert Xpress SARS-CoV-2/FLU/RSV plus assay is intended as an aid in the diagnosis of influenza from Nasopharyngeal swab specimens and should not be used as a sole basis for treatment. Nasal washings and aspirates are unacceptable for Xpert Xpress SARS-CoV-2/FLU/RSV testing.  Fact Sheet for Patients: BloggerCourse.com  Fact Sheet for Healthcare Providers: SeriousBroker.it  This test is not yet approved or cleared by the [place] FDA and has been authorized for detection and/or diagnosis of SARS-CoV-2 by FDA under an Emergency Use Authorization (EUA). This EUA will remain in effect (meaning this test can be used) for the duration of the COVID-19 declaration under Section 564(b)(1) of the Act, 21 U.S.C. section 360bbb-3(b)(1), unless the authorization is terminated or revoked.  Performed at [facility], [address] [place], [place] [address]   Blood culture (routine x 2)     Status: None (Preliminary result)   Collection Time: 11/25/20  7:00 PM   Specimen: BLOOD  Result Value Ref Range Status   Specimen Description BLOOD BLOOD RIGHT FOREARM  Final   Special Requests   Final    BOTTLES DRAWN AEROBIC AND ANAEROBIC Blood Culture results may not be optimal due to an inadequate volume of blood received in culture bottles   Culture   Final    NO GROWTH < 12 HOURS Performed at [facility], [address], [place], [place] [address]    Report Status PENDING  Incomplete  Culture, blood (Routine X 2) w Reflex to ID Panel     Status: None (Preliminary result)   Collection Time: 11/26/20 12:06 AM   Specimen: BLOOD  Result Value Ref Range Status   Specimen Description BLOOD LEFT ASSIST CONTROL  Final   Special Requests   Final    BOTTLES DRAWN AEROBIC ONLY Blood Culture adequate volume   Culture   Final    NO GROWTH < 12 HOURS Performed at [facility], [address] [place], [place] [address]    Report Status PENDING  Incomplete         Radiology Studies: CT ANGIO HEAD NECK W WO CM  Result Date: 11/25/2020 CLINICAL DATA:  Neuro deficit, acute, stroke suspected; right sided deficits EXAM: CT ANGIOGRAPHY HEAD AND NECK TECHNIQUE: Multidetector CT imaging of the head and neck was performed using the standard protocol during bolus administration of intravenous contrast. Multiplanar CT image reconstructions and MIPs were obtained to evaluate the vascular anatomy. Carotid stenosis measurements (when applicable) are obtained utilizing NASCET criteria, using the distal internal carotid diameter as the denominator. CONTRAST:  49mL OMNIPAQUE IOHEXOL 350 MG/ML SOLN COMPARISON:  None. FINDINGS: CTA NECK Aortic arch: Calcified and irregular noncalcified plaque along the arch. Patent great vessel origins. Plaque without high-grade proximal subclavian stenosis.  Right carotid system: Patent. Calcified plaque along the proximal internal carotid with less than 50% stenosis. Left carotid system: Patent. Mixed but primarily calcified plaque along the proximal internal carotid with less than 50% stenosis. Vertebral arteries: Patent.  Right vertebral artery is dominant. Skeleton: Degenerative changes of the cervical spine. Degenerative changes of the temporomandibular joints. Other neck: Unremarkable. Upper chest: Included upper lungs are clear. Review of the MIP images confirms the above findings CTA HEAD Anterior circulation: Intracranial internal carotid arteries are patent with calcified plaque but no significant stenosis. Anterior and middle cerebral arteries are patent. There is mild to moderate stenoses of the proximal right M1 MCA and  distal left M1 MCA. Moderate stenosis of the distal right M1 MCA. Additional mild M2 MCA stenoses. Posterior circulation: Intracranial vertebral arteries are patent. Basilar artery is patent. Moderate to marked stenosis of the mid basilar. Major cerebellar artery origins are patent. Bilateral posterior communicating arteries are present. Posterior cerebral arteries are patent. There is moderate stenosis of the left P2 PCA. Diminished visualization of distal PCA branches related to infarcts. Venous sinuses: Patent as allowed by contrast bolus timing. Review of the MIP images confirms the above findings IMPRESSION: No large vessel occlusion. Plaque at the proximal internal carotids causes less than 50% stenosis. Multifocal intracranial atherosclerosis. Electronically Signed   By: Guadlupe Spanish M.D.   On: 11/25/2020 16:39   DG Chest 2 View  Result Date: 11/25/2020 CLINICAL DATA:  Fever, weakness EXAM: CHEST - 2 VIEW COMPARISON:  09/22/2010 FINDINGS: There is hyperinflation of the lungs compatible with COPD. Prior CABG. Heart and mediastinal contours are within normal limits. No focal opacities or effusions. No acute bony abnormality.  IMPRESSION: COPD.  No active disease. Electronically Signed   By: Charlett Nose M.D.   On: 11/25/2020 17:32   CT HEAD WO CONTRAST  Result Date: 11/25/2020 CLINICAL DATA:  Possible stroke EXAM: CT HEAD WITHOUT CONTRAST TECHNIQUE: Contiguous axial images were obtained from the base of the skull through the vertex without intravenous contrast. COMPARISON:  Brain MRI 09/23/2010, CT head 09/04/2020 FINDINGS: Brain: There is hypodensity in the left PCA territory consistent with evolving early chronic infarct, as seen on CT head of 09/04/2020. Small foci of hyperdensity within the infarct territory may reflect cortical laminar necrosis. An additional remote infarct in the right parietal lobe is unchanged. There is no evidence of new acute infarct. There is no acute intracranial hemorrhage or extra-axial fluid collection. There is unchanged global parenchymal volume loss. Hypodensity throughout the remainder of the subcortical and periventricular white matter likely reflects sequela of chronic white matter microangiopathy. There is no mass lesion.  There is no midline shift. Vascular: There is calcification of the bilateral cavernous ICAs. Skull: Normal. Negative for fracture or focal lesion. Sinuses/Orbits: Bilateral lens implants are in place. The globes and orbits are otherwise unremarkable. Other: A mildly hyperdense lesion in the left parotid gland is incompletely imaged but was present in 2012 and may reflect a lymph node. IMPRESSION: 1. No acute intracranial hemorrhage or infarct. 2. Expected interval evolution of the left occipital lobe infarct since 09/04/2020. 3. Unchanged remote right occipital infarct, global parenchymal volume loss, and chronic white matter microangiopathy. Electronically Signed   By: Lesia Hausen M.D.   On: 11/25/2020 12:48   MR BRAIN WO CONTRAST  Result Date: 11/25/2020 CLINICAL DATA:  Neuro deficit, acute, stroke suspected. Right-sided deficits. EXAM: MRI HEAD WITHOUT CONTRAST  TECHNIQUE: Multiplanar, multiecho pulse sequences of the brain and surrounding structures were obtained without intravenous contrast. COMPARISON:  CT studies earlier same day.  CT 09/04/2020 FINDINGS: Brain: Mild chronic small-vessel ischemic change affects the pons. There are numerous old small vessel cerebellar infarctions affecting both hemispheres. Late subacute infarction of the left occipital lobe with evolutionary changes. No evidence of extension of the insult. There is been old infarction in the right occipital lobe. Punctate acute infarction in the right parietal cortical and subcortical brain without mass effect or hemorrhage. No evidence of acute hemorrhage, hydrocephalus or extra-axial fluid collection. Minimal petechial blood products in the old and subacute occipital infarctions. Chronic small-vessel ischemic change of the white matter, some of the insults being associated with  petechial blood products. Vascular: Major vessels at the base of the brain show flow. Skull and upper cervical spine: Negative Sinuses/Orbits: Clear/normal Other: None IMPRESSION: Late subacute infarction of the left occipital lobe. Old infarction of the right occipital lobe. Acute subcentimeter infarction affecting the right posterior parietal lobe. Numerous old small vessel infarctions affecting both cerebellar hemispheres. Chronic small-vessel ischemic changes of the pons and cerebral hemispheric white matter. Electronically Signed   By: Paulina Fusi M.D.   On: 11/25/2020 18:12        Scheduled Meds:  aspirin  81 mg Oral Daily   atorvastatin  80 mg Oral Daily   clopidogrel  75 mg Oral Daily   Continuous Infusions:  sodium chloride 30 mL/hr at 11/25/20 2323   cefTRIAXone (ROCEPHIN)  IV       LOS: 1 day    Time spent: 40 min    Silvano Bilis, MD [facility]   If 7PM-7AM, please contact night-coverage [email or web address] Password [facility] 11/26/2020, 9:49 AM

## 2020-11-26 NOTE — Consult Note (Signed)
OnceNeurology Consultation Reason for Consult: Stroke Requesting Physician: Shonna Chock  CC: Falls and running into things on the right side   History is obtained from: Patient, chart review and family at bedside  HPI: Madison Brown is a 87 y.o. female with a past medical history significant for some cognitive impairment, hypertension, hyperlipidemia, CAD s/p CABG and [employer] Biocor prosthetic aortic valve replacement (2009), prior strokes with left visual field loss (July 2022)  Per chart review she has been having gradual trouble with her memory since May 2022, does not drive but does yard work and cooking and cleaning in the home.  She has been having weight loss and was noted to have trouble with math calculations and remembering 5 objects at office visit at that time  Weight at that office visit was 39.6 kg (BMI 17), BMI is now charted as 15.94 with a weight of 40.8 kg (high charted as 5 feet 3 inches instead of 5 feet)  LKW: 2200 on 10/16; symptom discovery at 4 AM on 10/17 tPA given?: No, out of the window on presentation Premorbid modified rankin scale:      3 - Moderate disability. Requires some help, but able to walk unassisted.  ROS: Limited by patient's short term memory impairments but obtained from family as able   Past Medical History:  Diagnosis Date   Coronary artery disease    Dr. Gwen Pounds   Past Surgical History:  Procedure Laterality Date   CORONARY ARTERY BYPASS GRAFT  2009   Dr. Zebedee Iba at [facility]   VAGINAL DELIVERY     3   Current Outpatient Medications  Medication Instructions   amLODipine (NORVASC) 10 mg, Oral, Daily   atorvastatin (LIPITOR) 40 mg, Oral, Daily   clopidogrel (PLAVIX) 75 mg, Daily   pravastatin (PRAVACHOL) 20 mg, Daily    Current Facility-Administered Medications:    0.9 %  sodium chloride infusion, , Intravenous, Continuous, Gertha Calkin, MD, Last Rate: 30 mL/hr at 11/25/20 2323, New Bag at 11/25/20 2323   acetaminophen (TYLENOL) tablet  650 mg, 650 mg, Oral, Q4H PRN **OR** acetaminophen (TYLENOL) 160 MG/5ML solution 650 mg, 650 mg, Per Tube, Q4H PRN **OR** acetaminophen (TYLENOL) suppository 650 mg, 650 mg, Rectal, Q4H PRN, Gertha Calkin, MD   aspirin chewable tablet 81 mg, 81 mg, Oral, Daily, Irena Cords V, MD, 81 mg at 11/25/20 2148   atorvastatin (LIPITOR) tablet 80 mg, 80 mg, Oral, Daily, Patel, Ekta V, MD   cefTRIAXone (ROCEPHIN) 1 g in sodium chloride 0.9 % 100 mL IVPB, 1 g, Intravenous, Q24H, Patel, Ekta V, MD   clopidogrel (PLAVIX) tablet 75 mg, 75 mg, Oral, Daily, Gertha Calkin, MD   History reviewed. No pertinent family history.   Social History:  reports that she has never smoked. She has never used smokeless tobacco. She reports that she does not drink alcohol and does not use drugs.   Exam: Current vital signs: BP (!) 143/74 (BP Location: Left Arm)   Pulse 87   Temp 99.1 F (37.3 C) (Oral)   Resp 15   Ht 5\' 3"  (1.6 m)   Wt 40.8 kg   SpO2 100%   BMI 15.94 kg/m  Vital signs in last 24 hours: Temp:  [98.2 F (36.8 C)-100.6 F (38.1 C)] 99.1 F (37.3 C) (10/18 0740) Pulse Rate:  [87-115] 87 (10/18 0740) Resp:  [15-19] 15 (10/18 0740) BP: (135-168)/(73-90) 143/74 (10/18 0740) SpO2:  [95 %-100 %] 100 % (10/18 0740) Weight:  [40.8  kg] 40.8 kg (10/18 0201)   Physical Exam  Constitutional: Appears thin, frail, chronically ill Psych: Affect somewhat flat but interactive Eyes: No scleral injection HENT: No oropharyngeal obstruction.  MSK: no joint deformities.  Significant muscle wasting of the bilateral hands particularly Cardiovascular: Normal rate and regular rhythm.  Respiratory: Effort normal, non-labored breathing GI: Soft.  No distension. There is no tenderness.  Skin: Warm dry and intact visible skin  Neuro: Mental Status: Patient is awake, alert, oriented to person, place, month but not month, year, and situation. Patient is able to give limited history, states throat is sore and doesn't  want to talk  Cannot repeat complex sentence (no ifs and or buts) but otherwise speech is intact. No clear neglect Cranial Nerves: II: Visual Fields are bilaterally affected with possibly some spared islands of vision, likely intact central vision, difficult to ascertain on bedside testing. Pupils are equal, round, and reactive to light.   III,IV, VI: EOMI without ptosis or diploplia. Saccadic pursuits V: Facial sensation is symmetric to light touch VII: Facial movement is notable for mild right nasal labial fold flattening.  VIII: hearing is intact to voice X: Uvula elevates symmetrically XI: Shoulder shrug is symmetric. XII: tongue is midline without atrophy or fasciculations.  Motor: Tone is normal. Bulk is normal. 4+/5 throughout, requiring encouragement at times. Does have some pronation of the RUE > LUE, and prominent asterixis in all 4 extremities   Sensory: Sensation is symmetric to light touch in the arms and legs Deep Tendon Reflexes: 3+ and symmetric in the biceps, 2+ and symmetric patellae.  Cerebellar: FNF limited by asterixis but does also appear ataxic bilaterally, right worse than left. HKS are intact bilaterally  NIHSS total 13 Score breakdown: One-point for drowsiness, one-point for answering month incorrectly, 3 points for near blindness, one-point for right facial droop, one-point for left upper extremity weakness, one-point for right upper extremity weakness, one-point for left lower extremity weakness, one-point for right lower extremity weakness, 2 points for bilateral upper extremity ataxia right worse than left, one-point for inability to repeat complex sentences    I have reviewed labs in epic and the results pertinent to this consultation are:    Basic Metabolic Panel: Recent Labs  Lab 11/25/20 1056  NA 135  K 3.7  CL 97*  CO2 27  GLUCOSE 106*  BUN 12  CREATININE 1.24*  CALCIUM 9.1  Baseline creatinine is 1.1 (08/29/2019 and  06/28/2020)  CBC: Recent Labs  Lab 11/25/20 1056  WBC 10.8*  NEUTROABS 8.5*  HGB 12.3  HCT 37.1  MCV 89.2  PLT 173    Coagulation Studies: Recent Labs    11/25/20 1056  LABPROT 14.2  INR 1.1    Lab Results  Component Value Date   CHOL 120 11/26/2020   HDL 47 11/26/2020   LDLCALC 62 11/26/2020   LDLDIRECT 135.5 12/31/2011   TRIG 56 11/26/2020   CHOLHDL 2.6 11/26/2020   A1c 06/26/2020 6.6%, repeat pending No results found for: HGBA1C   Unresulted Labs (From admission, onward)     Start     Ordered   11/25/20 1819  Urine Culture  Add-on,   AD       Question:  Indication  Answer:  Bacteriuria screening (OB/GYN or Uro)   11/25/20 1818             I have reviewed the images obtained:  MRI brain personally reviewed, agree with radiology: Late subacute infarction of the left occipital lobe. Old  infarction of the right occipital lobe.   Acute subcentimeter infarction affecting the right posterior parietal lobe.   Numerous old small vessel infarctions affecting both cerebellar hemispheres. Chronic small-vessel ischemic changes of the pons and cerebral hemispheric white matter.  CTA head and neck personally reviewed, agree with radiology: No large vessel occlusion.  Plaque at the proximal internal carotids causes less than 50%  stenosis. Multifocal intracranial atherosclerosis. [Including significant PCA stenosis bilaterally related to her infarcts]  ECHO  1. Left ventricular ejection fraction, by estimation, is 60 to 65%. The left ventricle has normal function. Left ventricular endocardial border not optimally defined to evaluate regional wall motion. There is moderate left ventricular hypertrophy. Left  ventricular diastolic parameters are indeterminate.   2. Right ventricular systolic function is normal. The right ventricular size is normal.   3. The mitral valve is normal in structure. No evidence of mitral valve regurgitation. There is borderline mild  mitral stenosis. The mean mitral valve gradient is 4.0 mmHg.   4. The aortic valve was not well visualized. Aortic valve regurgitation is not visualized. Mild aortic valve sclerosis is present, with no  evidence of aortic valve stenosis.  [Normal biatrial sizes]  Assessment: Posterior circulation strokes in the setting of significant intracranial atherosclerosis as well as potential hypercoagulable state in the setting of acute COVID-19 infection +/- UTI   Impression:  -Bilateral occipital strokes, L > R secondary to intracranial atherosclerosis,  -COVID-19 infection w/ cough, sore throat, fever,  -possible UTI  Recommendations:  # Bilateral occipital strokes, L > R secondary to intracranial atherosclerosis  - Continue aspirin 81 mg daily for a 90-day course - Continue home Plavix 75 mg daily - Continue home atorvastatin 40 mg daily given patient is meeting goal LDL less than 70 - Goal A1c less than 7% to be followed up by primary team/PCP - Risk factor modification -- diet and exercise counseling - Telemetry monitoring - Blood pressure goal   - Normotension long-term blood pressure goal - PT consult, OT consult, Speech consult - Management of COVID19 and UTI per primary team, fortunately minimally symptomatic patient at this time; discussed with Dr. Ashok Pall that patient is not a good candidate for Paxlovid as it interacts with Plavix and reduces the efficacy of this antiplatelet agent - Neurology will be available on an as-needed basis going forward, please reconsult if new questions or concerns arise - Plan discussed with family at bedside. Attempted to additionally call Ms. Ermalene Searing at their request later (they declined to call her while I was in the room as stated she was busy at the time).   Brooke Dare MD-PhD  [facility] [phone] [facility] coverage for [facility] is from 8 AM to 4 AM in-house and 4 PM to 8 PM by telephone/video. 8 PM to 8 AM emergent  questions or overnight urgent questions should be addressed to Teleneurology On-call or [facility] neurohospitalist; contact information can be found on AMION

## 2020-11-26 NOTE — Plan of Care (Signed)
  Problem: Education: Goal: Knowledge of disease or condition will improve Outcome: Progressing   Problem: Coping: Goal: Will verbalize positive feelings about self Outcome: Progressing Goal: Will identify appropriate support needs Outcome: Progressing   Problem: Health Behavior/Discharge Planning: Goal: Ability to manage health-related needs will improve Outcome: Progressing   Problem: Self-Care: Goal: Ability to participate in self-care as condition permits will improve Outcome: Progressing Goal: Verbalization of feelings and concerns over difficulty with self-care will improve Outcome: Progressing Goal: Ability to communicate needs accurately will improve Outcome: Progressing   Problem: Nutrition: Goal: Risk of aspiration will decrease Outcome: Progressing Goal: Dietary intake will improve Outcome: Progressing   Problem: Intracerebral Hemorrhage Tissue Perfusion: Goal: Complications of Intracerebral Hemorrhage will be minimized Outcome: Progressing   Problem: Ischemic Stroke/TIA Tissue Perfusion: Goal: Complications of ischemic stroke/TIA will be minimized Outcome: Progressing   Problem: Spontaneous Subarachnoid Hemorrhage Tissue Perfusion: Goal: Complications of Spontaneous Subarachnoid Hemorrhage will be minimized Outcome: Progressing

## 2020-11-26 NOTE — Evaluation (Signed)
Physical Therapy Evaluation Patient Details Name: Madison Brown MRN: [id number] DOB: 22-Jan-1930 Today's Date: 11/26/2020  History of Present Illness  87 y.o. female who presents with slurred speech and balance difficulties. MRI brain revealed: acute infarction affecting the right posterior parietal lobe, subacute infarction of the left occipital lobe, old infarction of the right occipital lobe, and numerous old small vessel infarctions affecting both cerebellar hemispheres. Pt has past medical history of CAD, HTN, and Hyperlipidemia  Clinical Impression  Patient seated in recliner, daughter-in-law at bedside upon arrival to room.  Patient alert and oriented to self, location; unaware of situation, date or reason for admission.  Very pleasant and cooperative throughout evaluation; follows commands easily, no receptive/expressive language deficits appreciated.  Moderate R UE > LE weakness noted with isolated strength/range testing; does improve with resisted movement from therapist.  Some degree of sensory deficit and R-inattention noted; patient unable to fully describe/articulate due to current confusion.   Currently requiring mod assist for sit/stand, static standing balance and gait (5' forward/backward) without assist device.  Demonstrates reciprocal stepping with inconsistent R step height/length, limited by poor L ant/lateral weight shift; improved midline and gait mechanics with mod manual facilitation for L ant/lateral weight shift and midline. Would benefit from skilled PT to address above deficits and promote optimal return to PLOF.; recommend transition to acute inpatient rehab upon discharge for high-intensity, post-acute rehab services.         Recommendations for follow up therapy are one component of a multi-disciplinary discharge planning process, led by the attending physician.  Recommendations may be updated based on patient status, additional functional criteria and insurance  authorization.  Follow Up Recommendations CIR    Equipment Recommendations       Recommendations for Other Services       Precautions / Restrictions Precautions Precautions: Fall Restrictions Weight Bearing Restrictions: No      Mobility  Bed Mobility               General bed mobility comments: seated in recliner beginning/end of treatment session    Transfers Overall transfer level: Needs assistance Equipment used: None Transfers: Sit to/from Stand Sit to Stand: Mod assist         General transfer comment: R lateral lean with static standing, requiring mod manaul assist for L ant/lateral weight shift to attain/maintain midline  Ambulation/Gait Ambulation/Gait assistance: Mod assist Gait Distance (Feet):  (5' forward/backward) Assistive device: None       General Gait Details: reciprocal stepping with inconsistent R step height/length, limited by poor L ant/lateral weight shift; improved midline and gait mechanics with mod manual facilitation for L ant/lateral weight shift and midline.  Stairs            Wheelchair Mobility    Modified Rankin (Stroke Patients Only)       Balance Overall balance assessment: Needs assistance Sitting-balance support: No upper extremity supported;Feet supported Sitting balance-Leahy Scale: Good     Standing balance support: Bilateral upper extremity supported Standing balance-Leahy Scale: Poor                               Pertinent Vitals/Pain Pain Assessment: Faces Faces Pain Scale: No hurt    Home Living Family/patient expects to be discharged to:: Private residence Living Arrangements: Spouse/significant other Available Help at Discharge: Family;Available 24 hours/day;Available PRN/intermittently Type of Home: House Home Access: Stairs to enter Entrance Stairs-Rails: Left Entrance Stairs-Number of Steps: 4  Home Layout: One level        Prior Function Level of Independence: Needs  assistance   Gait / Transfers Assistance Needed: At baseline, pt is able to walk community distances (walked 2 laps around mall this past weekend) without assist device. Daughter-in-law reports pt has had at least 6-7 falls in past 6 months.  ADL's / Homemaking Assistance Needed: Pt requires some assistance for ADLs d/t decreased peripheral vision loss following previous stroke  Comments: Recently d/c from HHPT/HHOT following prior stroke.     Hand Dominance   Dominant Hand: Right    Extremity/Trunk Assessment   Upper Extremity Assessment Upper Extremity Assessment:  (R UE grossly 3-/5 with active movement, 4+/5 with resisted movement; acknowledges R UE feels "different", but unable to articulate; moderate coordination deficits, moderate inattention R UE)    Lower Extremity Assessment Lower Extremity Assessment:  (grossly at least 4-/5 throughout; difficulty with sustained activation with divided attention, mild inattention apparent)       Communication   Communication: No difficulties  Cognition Arousal/Alertness: Awake/alert Behavior During Therapy: WFL for tasks assessed/performed Overall Cognitive Status: Impaired/Different from baseline                                 General Comments: A&Ox1. Pt's family reporting that pt is typically oriented to self, place, and month/year. Pt with decreased short term memory and attention this date, requiring frequent verbal cues for attending to task.  suspected inattention to R hemi-body; mild difficulty with R/L discrimination at times      General Comments      Exercises Other Exercises Other Exercises: Reviewed role of PT and progressive mobility; initiated education on midline orientation/positioning (towel roll under R IT for more neutral positioning); consistent cuing for visual tracking/scanning, esp to R.  R UE propped on pillow for visual awareness and for limb protection. Other Exercises: Sit/stand x3 without  assist device, mod assist-good active use of bilat LEs, constant manual assist for midline   Assessment/Plan    PT Assessment Patient needs continued PT services  PT Problem List Decreased strength;Decreased activity tolerance;Decreased balance;Decreased range of motion;Decreased mobility;Decreased coordination;Decreased cognition;Decreased knowledge of use of DME;Decreased safety awareness;Decreased knowledge of precautions;Impaired sensation       PT Treatment Interventions DME instruction;Gait training;Stair training;Functional mobility training;Therapeutic activities;Therapeutic exercise;Balance training;Patient/family education;Cognitive remediation;Neuromuscular re-education    PT Goals (Current goals can be found in the Care Plan section)  Acute Rehab PT Goals Patient Stated Goal: to return home when safe to do so PT Goal Formulation: With patient/family Time For Goal Achievement: 12/10/20 Potential to Achieve Goals: Good    Frequency 7X/week   Barriers to discharge        Co-evaluation               AM-PAC PT "6 Clicks" Mobility  Outcome Measure Help needed turning from your back to your side while in a flat bed without using bedrails?: A Little Help needed moving from lying on your back to sitting on the side of a flat bed without using bedrails?: A Little Help needed moving to and from a bed to a chair (including a wheelchair)?: A Lot Help needed standing up from a chair using your arms (e.g., wheelchair or bedside chair)?: A Lot Help needed to walk in hospital room?: A Lot Help needed climbing 3-5 steps with a railing? : A Lot 6 Click Score: 14    End  of Session Equipment Utilized During Treatment: Gait belt Activity Tolerance: Patient tolerated treatment well Patient left: in chair;with call bell/phone within reach;with chair alarm set;with family/visitor present Nurse Communication: Mobility status PT Visit Diagnosis: Hemiplegia and hemiparesis;Muscle  weakness (generalized) (M62.81) Hemiplegia - Right/Left: Right Hemiplegia - dominant/non-dominant: Dominant Hemiplegia - caused by: Cerebral infarction    Time: 9833-8250 PT Time Calculation (min) (ACUTE ONLY): 41 min   Charges:   PT Evaluation $PT Eval Moderate Complexity: 1 Mod PT Treatments $Neuromuscular Re-education: 8-22 mins       Chinara Hertzberg H. Manson Passey, PT, DPT, NCS 11/26/20, 2:29 PM [phone]

## 2020-11-26 NOTE — Evaluation (Addendum)
Clinical/Bedside Swallow Evaluation Patient Details  Name: Madison Brown MRN: [id number] Date of Birth: [birth year]  Today's Date: 11/26/2020 Time: SLP Start Time (ACUTE ONLY): 0935 SLP Stop Time (ACUTE ONLY): 1035 SLP Time Calculation (min) (ACUTE ONLY): 60 min  Past Medical History:  Past Medical History:  Diagnosis Date   Coronary artery disease    Dr. Gwen Pounds   Past Surgical History:  Past Surgical History:  Procedure Laterality Date   CORONARY ARTERY BYPASS GRAFT  2009   Dr. Zebedee Iba at [facility]   VAGINAL DELIVERY     3   HPI:  Pt is a 87 y.o. female who presents with weakness, slurred speech and balance difficulties. Pt is COVID+ this admit. MRI brain revealed: acute infarction affecting the right posterior parietal lobe, subacute infarction of the left occipital lobe, old infarction of the right occipital lobe, and numerous old small vessel infarctions affecting both cerebellar hemispheres. Pt has past medical history of CAD, HTN, COPD, and Hyperlipidemia. Family stated pt had been having trouble w/ her vision since the Summer.  CXR: COPD.  No active disease.    Assessment / Plan / Recommendation  Clinical Impression  Pt appears to present w/ grossly adequate oropharyngeal phase swallow function in light of new CVA; MRI brain revealed acute infarction affecting the right posterior parietal lobe, subacute infarction of the left/right occipital lobes. Pt consumed po trials w/ No overt coughing or other strong clinical s/s of aspiration during po trials. Pt appears at reduced risk for aspiration following aspiration precautions and when given support during the meals d/t Vision deficits baseline.      During po trials, pt consumed all consistencies w/ no overt coughing, decline in vocal quality, or change in respiratory presentation during/post trials. Audible swallows w/ intermittent multiple swallowing was noted moreso w/ thin liquids. Pt also winced and c/o a "sore throat" that seemed to  impact her initially during trials of thin liquids. Cup drinking ONLY. Oral phase appeared grossly [facility] w/ timely bolus management, mastication, and control of bolus propulsion for A-P transfer for swallowing. Diffuse residue noted w/ eggs but this cleared alternating w/ applesauce. Oral clearing achieved w/ all trial consistencies. OM Exam appeared [facility] w/ no unilateral weakness noted. Speech Clear. Pt fed self w/ full setup support and min cues d/t Vision deficits.      Recommend a more Mech Soft consistency diet w/ well-Cut meats, moistened foods; Thin liquids VIA CUP.  NO STRAWS. Recommend aspiration precautions and monitoring for clinical s/s of aspiration or negative sequelae from aspiration including decline in lung status; Pills WHOLE in Puree for safer, easier swallowing. Education given on Pills in Puree; food consistencies and easy to eat options; general aspiration precautions. ST services will f/u w/ toleration of diet and education as needed while admitted.    OF NOTE: pt's speech was appropriate for general conversation and experssing wants/needs. Pt was fatigued but speech was clear and fully intelligible. Dtr in law agreed. No cog-linguistic needs indicated at this time. SLP Visit Diagnosis: Dysphagia, oropharyngeal phase (R13.12)    Aspiration Risk  Mild aspiration risk;Risk for inadequate nutrition/hydration (reduced following precautions)    Diet Recommendation   Mech Soft consistency diet w/ well-Cut meats, moistened foods; Thin liquids VIA CUP.  NO STRAWS. Recommend aspiration precautions and support/assistance at meals for pt d/t Vision deficits, weakness overall  Medication Administration: Whole meds with puree    Other  Recommendations Recommended Consults:  (Dietician f/u) Oral Care Recommendations: Oral care BID;Oral care before  and after PO;Patient independent with oral care Other Recommendations:  (n/a)    Recommendations for follow up therapy are one component of a  multi-disciplinary discharge planning process, led by the attending physician.  Recommendations may be updated based on patient status, additional functional criteria and insurance authorization.  Follow up Recommendations None (TBD)      Frequency and Duration min 2x/week  1 week       Prognosis Prognosis for Safe Diet Advancement: Fair (-Good) Barriers to Reach Goals: Time post onset;Severity of deficits (COVID+ currently)      Swallow Study   General Date of Onset: 11/25/20 HPI: Pt is a 87 y.o. female who presents with weakness, slurred speech and balance difficulties. Pt is COVID+ this admit. MRI brain revealed: acute infarction affecting the right posterior parietal lobe, subacute infarction of the left occipital lobe, old infarction of the right occipital lobe, and numerous old small vessel infarctions affecting both cerebellar hemispheres. Pt has past medical history of CAD, HTN, COPD, and Hyperlipidemia. Family stated pt had been having trouble w/ her vision since the Summer.  CXR: COPD.  No active disease. Type of Study: Bedside Swallow Evaluation Previous Swallow Assessment: none Diet Prior to this Study: Regular;Thin liquids Temperature Spikes Noted: No (wbc 10.8) Respiratory Status: Room air History of Recent Intubation: No Behavior/Cognition: Alert;Cooperative;Pleasant mood;Distractible;Requires cueing (eyes closing some; vision deficits baseline) Oral Cavity Assessment: Dry (min) Oral Care Completed by SLP: Yes Oral Cavity - Dentition: Adequate natural dentition;Missing dentition (few) Vision: Impaired for self-feeding (intermittently; needed support and orientation to task at times) Self-Feeding Abilities: Able to feed self;Needs assist;Needs set up;Total assist Patient Positioning: Upright in chair Baseline Vocal Quality: Normal (soft) Volitional Cough: Strong;Congested Volitional Swallow: Able to elicit    Oral/Motor/Sensory Function Overall Oral Motor/Sensory  Function: Within functional limits   Ice Chips Ice chips: Within functional limits Presentation: Spoon (fed; 3 trials)   Thin Liquid Thin Liquid: Impaired (mild) Presentation: Cup;Self Fed (10 trials) Oral Phase Impairments:  (none) Pharyngeal  Phase Impairments: Multiple swallows;Suspected delayed Swallow (audible swallows) Other Comments: wincing during swallwoing -- c/o sore throat. NSG made aware.    Nectar Thick Nectar Thick Liquid: Not tested   Honey Thick Honey Thick Liquid: Not tested   Puree Puree: Within functional limits Presentation: Self Fed;Spoon (4 ozs) Other Comments: audible swallows   Solid     Solid: Within functional limits (grossly) Presentation: Self Fed;Spoon (10 trials)       Jerilynn Som, MS, CCC-SLP Speech Language Pathologist Rehab Services [phone] Tomie Elko 11/26/2020,11:44 AM

## 2020-11-27 DIAGNOSIS — R4781 Slurred speech: Secondary | ICD-10-CM | POA: Diagnosis not present

## 2020-11-27 LAB — URINE CULTURE: Culture: >=100000 — AB

## 2020-11-27 LAB — HEMOGLOBIN A1C
Hgb A1c MFr Bld: 6 % — ABNORMAL HIGH (ref 4.8–5.6)
Mean Plasma Glucose: 126 mg/dL

## 2020-11-27 LAB — CBC
HCT: 33.4 % — ABNORMAL LOW (ref 36.0–46.0)
Hemoglobin: 10.9 g/dL — ABNORMAL LOW (ref 12.0–15.0)
MCH: 28.9 pg (ref 26.0–34.0)
MCHC: 32.6 g/dL (ref 30.0–36.0)
MCV: 88.6 fL (ref 80.0–100.0)
Platelets: 153 10*3/uL (ref 150–400)
RBC: 3.77 MIL/uL — ABNORMAL LOW (ref 3.87–5.11)
RDW: 13.6 % (ref 11.5–15.5)
WBC: 12.2 10*3/uL — ABNORMAL HIGH (ref 4.0–10.5)
nRBC: 0 % (ref 0.0–0.2)

## 2020-11-27 LAB — BASIC METABOLIC PANEL
Anion gap: 6 (ref 5–15)
BUN: 15 mg/dL (ref 8–23)
CO2: 29 mmol/L (ref 22–32)
Calcium: 8.2 mg/dL — ABNORMAL LOW (ref 8.9–10.3)
Chloride: 100 mmol/L (ref 98–111)
Creatinine, Ser: 1.24 mg/dL — ABNORMAL HIGH (ref 0.44–1.00)
GFR, Estimated: 42 mL/min — ABNORMAL LOW (ref >60)
Glucose, Bld: 123 mg/dL — ABNORMAL HIGH (ref 70–99)
Potassium: 2.9 mmol/L — ABNORMAL LOW (ref 3.5–5.1)
Sodium: 135 mmol/L (ref 135–145)

## 2020-11-27 MED ORDER — POTASSIUM CHLORIDE CRYS ER 20 MEQ PO TBCR
40.0000 meq | EXTENDED_RELEASE_TABLET | ORAL | Status: AC
Start: 2020-11-27 — End: 2020-11-27
  Administered 2020-11-27 (×2): 40 meq via ORAL
  Filled 2020-11-27 (×2): qty 2

## 2020-11-27 MED ORDER — ENOXAPARIN SODIUM 30 MG/0.3ML IJ SOSY
30.0000 mg | PREFILLED_SYRINGE | Freq: Every day | INTRAMUSCULAR | Status: DC
  Filled 2020-11-27: qty 0.3

## 2020-11-27 MED ORDER — CEFAZOLIN SODIUM-DEXTROSE 1-4 GM/50ML-% IV SOLN
1.0000 g | Freq: Two times a day (BID) | INTRAVENOUS | Status: AC
  Administered 2020-11-27 – 2020-11-29 (×6): 1 g via INTRAVENOUS
  Filled 2020-11-27 (×6): qty 50

## 2020-11-27 MED ORDER — ENOXAPARIN SODIUM 30 MG/0.3ML IJ SOSY
30.0000 mg | PREFILLED_SYRINGE | Freq: Every day | INTRAMUSCULAR | Status: DC
  Administered 2020-11-27 – 2020-12-05 (×6): 30 mg via SUBCUTANEOUS
  Filled 2020-11-27 (×7): qty 0.3

## 2020-11-27 MED ORDER — ENSURE ENLIVE PO LIQD
237.0000 mL | Freq: Two times a day (BID) | ORAL | Status: DC
  Administered 2020-11-27 – 2020-12-06 (×16): 237 mL via ORAL

## 2020-11-27 NOTE — TOC Progression Note (Signed)
Transition of Care [facility]) - Progression Note    Patient Details  Name: Madison Brown MRN: [id number] Date of Birth: December 09, 1929  Transition of [facility] CM/SW Contact  Caryn Section, RN Phone Number: 11/27/2020, 4:08 PM  Clinical Narrative:   Patient is COVID + and a candidate for CIR.  Patient will need to wait until end of COVID quarantine on 10/27 to transfer to CIR.  Patient and family aware.    Expected Discharge Plan: IP Rehab Facility (CIR recommended and will evaluate)    Expected Discharge Plan and Services Expected Discharge Plan: IP Rehab Facility (CIR recommended and will evaluate)     Post Acute Care Choice:  (TBD)                                         Social Determinants of Health (SDOH) Interventions    Readmission Risk Interventions No flowsheet data found.

## 2020-11-27 NOTE — Progress Notes (Addendum)
PROGRESS NOTE    Madison Brown   FIE:[id number]  DOB: 06-05-1929  PCP: Enid Baas, MD    DOA: 11/25/2020 LOS: 2    Brief Narrative / Hospital Course to Date:   Madison Brown is a 87 y.o. female seen in ed with complaints of Slurred speech and off balance  today morning. Last night pt went to bed at baseline and about 4 am she was not herself and had slurred speech and off balance. Pt reported to be leaning to the right side. Both daughter at bedside. Pt lives with spouse and at about 4 am when she got up to use restroom she was weak on right side and  knocked things down  by leaning on them  due to lack of balance.   Patient was found to have a subacute left occipital lobe stroke, an old right occipital and acute right posterior parietal stroke.  Neurology consulted.  Patient incidentally tested positive for Covid-19, without any complaints of respiratory symptoms.   Started on antibiotic for Klebsiella UTI.  Assessment & Plan   Principal Problem:   Slurred speech Active Problems:   CAD (coronary artery disease)   Essential hypertension, benign   Acute CVA (cerebrovascular accident) (HCC)   COVID-19 virus infection   Hx of CABG   S/P aortic valve replacement with bioprosthetic valve   Dementia (HCC)   Subacute CVA  Symptoms began early AM of 10/17.  MRI shows acute right posterior parietal lobe and subacute left occipital strokes.  CTA head and neck without large vessel occlusions. Passed swallow evaluation.  No events on tele.  --Neurology consulted -- Echo findings below --Telemetry --PT/OT - PT recommends CIR for rehab at discharge --Plavix loaded 300 mg, continue daily Plavix 75 mg --Full dose ASA given, continue ASA 81 mg daily --Continue high-intensity statin: Lipitor 80 mg daily  ECHO 11/26/2020  1. Left ventricular ejection fraction, by estimation, is 60 to 65%. The left ventricle has normal function. Left ventricular endocardial border not  optimally defined to evaluate regional wall motion. There is moderate left ventricular hypertrophy. Left  ventricular diastolic parameters are indeterminate.   2. Right ventricular systolic function is normal. The right ventricular size is normal.   3. The mitral valve is normal in structure. No evidence of mitral valve regurgitation. There is borderline mild mitral stenosis. The mean mitral valve gradient is 4.0 mmHg.   4. The aortic valve was not well visualized. Aortic valve regurgitation is not visualized. Mild aortic valve sclerosis is present, with no  evidence of aortic valve stenosis.    Hypokalemia -replacing potassium for K2.9 today (10/19).  Monitor BMP and replace as needed.  Covid-19 Infection - Incidental positive screen on admission.  Patient asymptomatic. - holding on paxlovid given need to continue atorvastatin   Elevated Hbg A1c / Prediabetes - Recent Hbg A1c 6.6%.  Repeat A1c this admission is 6.0%. Recommend monitoring as outpatient.   Hypertension - BP mildly elevated. - hold home amlodipine, relative permissive htn for now   History aortic stenosis s/p bioprosthetic aortic valve replacement - appears compensated Echo findings as above. Monitor.   CAD s/p 2-vessel CABG - stable, no active chest pain. Continue statin, aspirin   Dementia without behavioral disturbances.  Delirium precautions.   Underweight: Body mass index is 15.94 kg/m.   DVT prophylaxis: SCD's Start: 11/25/20 2106   Diet:  Diet Orders (From admission, onward)     Start     Ordered   11/26/20 1027  DIET DYS 3 Room service appropriate? Yes with Assist; Fluid consistency: Thin  Diet effective now       Comments: Extra Gravy on meats, potatoes.  NO STRAWS!!! Pt can have chicken fingers per Speech (plz cut at little!) Cream Soups best.  Question Answer Comment  Room service appropriate? Yes with Assist   Fluid consistency: Thin      11/26/20 1028              Code Status: Full  Code   Subjective 11/27/20    Patient up in recliner when seen today.  Daughter-in-law at bedside.  Patient reports wanting to go home.  She denies fevers or chills.  Daughter-in-law reports patient's had a mild dry cough but no other apparent respiratory symptoms.  Patient Williemae Natter like she is improving in her balance and mobility.  She and daughter-in-law are agreeable to CIR placement once off COVID precautions.   Disposition Plan & Communication   Status is: Inpatient  Remains inpatient appropriate because: Requires CIR for rehab once off COVID isolation precautions    Family Communication: Daughter-in-law at bedside on rounds today 10/19   Consults, Procedures, Significant Events   Consultants:  Neurology  Procedures:  Echo 11/27/2020, findings above  Antimicrobials:  Anti-infectives (From admission, onward)    Start     Dose/Rate Route Frequency Ordered Stop   11/26/20 1800  cefTRIAXone (ROCEPHIN) 1 g in sodium chloride 0.9 % 100 mL IVPB        1 g 200 mL/hr over 30 Minutes Intravenous Every 24 hours 11/25/20 2108     11/25/20 1815  cefTRIAXone (ROCEPHIN) 1 g in sodium chloride 0.9 % 100 mL IVPB        1 g 200 mL/hr over 30 Minutes Intravenous  Once 11/25/20 1807 11/25/20 1958         Micro    Objective   Vitals:   11/26/20 2053 11/26/20 2200 11/27/20 0100 11/27/20 0500  BP: (!) 154/80 (!) 160/80 (!) 145/73 (!) 142/64  Pulse: (!) 107 89 (!) 101 95  Resp: 16 20 20 20   Temp: 100.1 F (37.8 C) 99 F (37.2 C) 98.5 F (36.9 C) 99 F (37.2 C)  TempSrc: Oral  Oral Oral  SpO2: 97% 100% 98% 97%  Weight:      Height:        Intake/Output Summary (Last 24 hours) at 11/27/2020 0750 Last data filed at 11/27/2020 0100 Gross per 24 hour  Intake --  Output 400 ml  Net -400 ml   Filed Weights   11/26/20 0201  Weight: 40.8 kg    Physical Exam:  General exam: awake, alert, no acute distress, frail and underweight HEENT: atraumatic, clear conjunctiva,  anicteric sclera, moist mucus membranes, hearing grossly normal  Respiratory system: CTAB, no wheezes, rales or rhonchi, normal respiratory effort. Cardiovascular system: normal S1/S2, RRR, no JVD, murmurs, rubs, gallops, no pedal edema.   Gastrointestinal system: soft, NT, ND, no HSM felt, +bowel sounds. Central nervous system: no gross focal neurologic deficits, normal speech Extremities: moves all, no edema, normal tone Psychiatry: normal mood, congruent affect, abnormal judgment and insight due to dementia  Labs   Data Reviewed: I have personally reviewed following labs and imaging studies  CBC: Recent Labs  Lab 11/25/20 1056 11/27/20 0358  WBC 10.8* 12.2*  NEUTROABS 8.5*  --   HGB 12.3 10.9*  HCT 37.1 33.4*  MCV 89.2 88.6  PLT 173 153   Basic Metabolic Panel: Recent Labs  Lab  11/25/20 1056 11/27/20 0358  NA 135 135  K 3.7 2.9*  CL 97* 100  CO2 27 29  GLUCOSE 106* 123*  BUN 12 15  CREATININE 1.24* 1.24*  CALCIUM 9.1 8.2*   GFR: Estimated Creatinine Clearance: 19.8 mL/min (A) (by C-G formula based on SCr of 1.24 mg/dL (H)). Liver Function Tests: Recent Labs  Lab 11/25/20 1056  AST 15  ALT 11  ALKPHOS 88  BILITOT 0.6  PROT 8.0  ALBUMIN 3.9   No results for input(s): LIPASE, AMYLASE in the last 168 hours. No results for input(s): AMMONIA in the last 168 hours. Coagulation Profile: Recent Labs  Lab 11/25/20 1056  INR 1.1   Cardiac Enzymes: No results for input(s): CKTOTAL, CKMB, CKMBINDEX, TROPONINI in the last 168 hours. BNP (last 3 results) No results for input(s): PROBNP in the last 8760 hours. HbA1C: Recent Labs    11/26/20 0514  HGBA1C 6.0*   CBG: No results for input(s): GLUCAP in the last 168 hours. Lipid Profile: Recent Labs    11/26/20 0514  CHOL 120  HDL 47  LDLCALC 62  TRIG 56  CHOLHDL 2.6   Thyroid Function Tests: No results for input(s): TSH, T4TOTAL, FREET4, T3FREE, THYROIDAB in the last 72 hours. Anemia Panel: No  results for input(s): VITAMINB12, FOLATE, FERRITIN, TIBC, IRON, RETICCTPCT in the last 72 hours. Sepsis Labs: No results for input(s): PROCALCITON, LATICACIDVEN in the last 168 hours.  Recent Results (from the past 240 hour(s))  Resp Panel by RT-PCR (Flu A&B, Covid) Urine, Clean Catch     Status: Abnormal   Collection Time: 11/25/20  5:29 PM   Specimen: Urine, Clean Catch; Nasopharyngeal(NP) swabs in vial transport medium  Result Value Ref Range Status   SARS Coronavirus 2 by RT PCR POSITIVE (A) NEGATIVE Final    Comment: RESULT CALLED TO, READ BACK BY AND VERIFIED WITH: MATT BASSETT @1952  ON 11/25/20 SKL (NOTE) SARS-CoV-2 target nucleic acids are DETECTED.  The SARS-CoV-2 RNA is generally detectable in upper respiratory specimens during the acute phase of infection. Positive results are indicative of the presence of the identified virus, but do not rule out bacterial infection or co-infection with other pathogens not detected by the test. Clinical correlation with patient history and other diagnostic information is necessary to determine patient infection status. The expected result is Negative.  Fact Sheet for Patients: 11/27/20  Fact Sheet for Healthcare Providers: BloggerCourse.com  This test is not yet approved or cleared by the [employer] FDA and  has been authorized for detection and/or diagnosis of SARS-CoV-2 by FDA under an Emergency Use Authorization (EUA).  This EUA will remain in effect (meaning this test can b e used) for the duration of  the COVID-19 declaration under Section 564(b)(1) of the Act, 21 U.S.C. section 360bbb-3(b)(1), unless the authorization is terminated or revoked sooner.     Influenza A by PCR NEGATIVE NEGATIVE Final   Influenza B by PCR NEGATIVE NEGATIVE Final    Comment: (NOTE) The Xpert Xpress SARS-CoV-2/FLU/RSV plus assay is intended as an aid in the diagnosis of influenza from  Nasopharyngeal swab specimens and should not be used as a sole basis for treatment. Nasal washings and aspirates are unacceptable for Xpert Xpress SARS-CoV-2/FLU/RSV testing.  Fact Sheet for Patients: [place]  Fact Sheet for Healthcare Providers: BloggerCourse.com  This test is not yet approved or cleared by the [employer] FDA and has been authorized for detection and/or diagnosis of SARS-CoV-2 by FDA under an Emergency Use Authorization (EUA). This  EUA will remain in effect (meaning this test can be used) for the duration of the COVID-19 declaration under Section 564(b)(1) of the Act, 21 U.S.C. section 360bbb-3(b)(1), unless the authorization is terminated or revoked.  Performed at [facility], [address], [place], [place] [address]   Urine Culture     Status: Abnormal   Collection Time: 11/25/20  5:29 PM   Specimen: Urine, Random  Result Value Ref Range Status   Specimen Description   Final    URINE, RANDOM Performed at [facility], [address] [place], [place] [address]    Special Requests   Final    NONE Performed at [facility], [address] [place], [place] [address]    Culture >=100,000 COLONIES/mL KLEBSIELLA PNEUMONIAE (A)  Final   Report Status 11/27/2020 FINAL  Final   Organism ID, Bacteria KLEBSIELLA PNEUMONIAE (A)  Final      Susceptibility   Klebsiella pneumoniae - MIC*    AMPICILLIN RESISTANT Resistant     CEFAZOLIN <=4 SENSITIVE Sensitive     CEFEPIME <=0.12 SENSITIVE Sensitive     CEFTRIAXONE <=0.25 SENSITIVE Sensitive     CIPROFLOXACIN <=0.25 SENSITIVE Sensitive     GENTAMICIN <=1 SENSITIVE Sensitive     IMIPENEM <=0.25 SENSITIVE Sensitive     NITROFURANTOIN <=16 SENSITIVE Sensitive     TRIMETH/SULFA <=20 SENSITIVE Sensitive     AMPICILLIN/SULBACTAM <=2 SENSITIVE Sensitive     PIP/TAZO <=4 SENSITIVE Sensitive     * >=100,000 COLONIES/mL KLEBSIELLA  PNEUMONIAE  Blood culture (routine x 2)     Status: None (Preliminary result)   Collection Time: 11/25/20  7:00 PM   Specimen: BLOOD  Result Value Ref Range Status   Specimen Description BLOOD BLOOD RIGHT FOREARM  Final   Special Requests   Final    BOTTLES DRAWN AEROBIC AND ANAEROBIC Blood Culture results may not be optimal due to an inadequate volume of blood received in culture bottles   Culture   Final    NO GROWTH 2 DAYS Performed at [facility], [address] [place], [place] [address]    Report Status PENDING  Incomplete  Culture, blood (Routine X 2) w Reflex to ID Panel     Status: None (Preliminary result)   Collection Time: 11/26/20 12:06 AM   Specimen: BLOOD  Result Value Ref Range Status   Specimen Description BLOOD LEFT ASSIST CONTROL  Final   Special Requests   Final    BOTTLES DRAWN AEROBIC ONLY Blood Culture adequate volume   Culture   Final    NO GROWTH 1 DAY Performed at [facility], [address], [place], [place] [address]    Report Status PENDING  Incomplete      Imaging Studies   CT ANGIO HEAD NECK W WO CM  Result Date: 11/25/2020 CLINICAL DATA:  Neuro deficit, acute, stroke suspected; right sided deficits EXAM: CT ANGIOGRAPHY HEAD AND NECK TECHNIQUE: Multidetector CT imaging of the head and neck was performed using the standard protocol during bolus administration of intravenous contrast. Multiplanar CT image reconstructions and MIPs were obtained to evaluate the vascular anatomy. Carotid stenosis measurements (when applicable) are obtained utilizing NASCET criteria, using the distal internal carotid diameter as the denominator. CONTRAST:  60mL OMNIPAQUE IOHEXOL 350 MG/ML SOLN COMPARISON:  None. FINDINGS: CTA NECK Aortic arch: Calcified and irregular noncalcified plaque along the arch. Patent great vessel origins. Plaque without high-grade proximal subclavian stenosis. Right carotid system: Patent. Calcified plaque along the proximal  internal carotid with less than 50% stenosis. Left carotid system: Patent. Mixed but primarily calcified plaque along the proximal internal carotid with less than 50% stenosis. Vertebral arteries: Patent.  Right vertebral artery is dominant. Skeleton: Degenerative changes of the cervical spine. Degenerative changes of the temporomandibular joints. Other neck: Unremarkable. Upper chest: Included upper lungs are clear. Review of the MIP images confirms the above findings CTA HEAD Anterior circulation: Intracranial internal carotid arteries are patent with calcified plaque but no significant stenosis. Anterior and middle cerebral arteries are patent. There is mild to moderate stenoses of the proximal right M1 MCA and distal left M1 MCA. Moderate stenosis of the distal right M1 MCA. Additional mild M2 MCA stenoses. Posterior circulation: Intracranial vertebral arteries are patent. Basilar artery is patent. Moderate to marked stenosis of the mid basilar. Major cerebellar artery origins are patent. Bilateral posterior communicating arteries are present. Posterior cerebral arteries are patent. There is moderate stenosis of the left P2 PCA. Diminished visualization of distal PCA branches related to infarcts. Venous sinuses: Patent as allowed by contrast bolus timing. Review of the MIP images confirms the above findings IMPRESSION: No large vessel occlusion. Plaque at the proximal internal carotids causes less than 50% stenosis. Multifocal intracranial atherosclerosis. Electronically Signed   By: Guadlupe Spanish M.D.   On: 11/25/2020 16:39   DG Chest 2 View  Result Date: 11/25/2020 CLINICAL DATA:  Fever, weakness EXAM: CHEST - 2 VIEW COMPARISON:  09/22/2010 FINDINGS: There is hyperinflation of the lungs compatible with COPD. Prior CABG. Heart and mediastinal contours are within normal limits. No focal opacities or effusions. No acute bony abnormality. IMPRESSION: COPD.  No active disease. Electronically Signed   By:  Charlett Nose M.D.   On: 11/25/2020 17:32   CT HEAD WO CONTRAST  Result Date: 11/25/2020 CLINICAL DATA:  Possible stroke EXAM: CT HEAD WITHOUT CONTRAST TECHNIQUE: Contiguous axial images were obtained from the base of the skull through the vertex without intravenous contrast. COMPARISON:  Brain MRI 09/23/2010, CT head 09/04/2020 FINDINGS: Brain: There is hypodensity in the left PCA territory consistent with evolving early chronic infarct, as seen on CT head of 09/04/2020. Small foci of hyperdensity within the infarct territory may reflect cortical laminar necrosis. An additional remote infarct in the right parietal lobe is unchanged. There is no evidence of new acute infarct. There is no acute intracranial hemorrhage or extra-axial fluid collection. There is unchanged global parenchymal volume loss. Hypodensity throughout the remainder of the subcortical and periventricular white matter likely reflects sequela of chronic white matter microangiopathy. There is no mass lesion.  There is no midline shift. Vascular: There is calcification of the bilateral cavernous ICAs. Skull: Normal. Negative for fracture or focal lesion. Sinuses/Orbits: Bilateral lens implants are in place. The globes and orbits are otherwise unremarkable. Other: A mildly hyperdense lesion in the left parotid gland is incompletely imaged but was present in 2012 and may reflect a lymph node. IMPRESSION: 1. No acute intracranial hemorrhage or infarct. 2. Expected interval evolution of the left occipital lobe infarct since 09/04/2020. 3. Unchanged remote right occipital infarct, global parenchymal volume loss, and chronic white matter microangiopathy. Electronically Signed   By: Lesia Hausen M.D.   On: 11/25/2020 12:48   MR BRAIN WO CONTRAST  Result Date: 11/25/2020 CLINICAL DATA:  Neuro deficit, acute, stroke suspected. Right-sided deficits. EXAM: MRI HEAD WITHOUT CONTRAST TECHNIQUE: Multiplanar, multiecho pulse sequences of the brain and  surrounding structures were obtained without intravenous contrast. COMPARISON:  CT studies earlier same day.  CT 09/04/2020 FINDINGS: Brain: Mild chronic small-vessel ischemic change affects the pons. There are numerous old small vessel cerebellar infarctions affecting both hemispheres. Late subacute infarction of the left occipital lobe with evolutionary changes. No evidence of extension of the insult. There is been old infarction in the right occipital lobe. Punctate acute infarction in the right parietal cortical and subcortical brain without mass effect or hemorrhage. No evidence of acute hemorrhage, hydrocephalus or extra-axial fluid collection. Minimal petechial blood products in the old and subacute occipital infarctions. Chronic small-vessel ischemic change of the white matter, some of the insults being associated with petechial blood products. Vascular: Major vessels at the base of the brain show flow. Skull and upper cervical spine: Negative Sinuses/Orbits: Clear/normal Other: None IMPRESSION: Late subacute infarction of the left occipital lobe. Old infarction of the right occipital lobe. Acute subcentimeter infarction affecting the right posterior parietal lobe. Numerous old small vessel infarctions affecting both cerebellar hemispheres. Chronic small-vessel ischemic changes of the pons and cerebral hemispheric white matter. Electronically Signed   By: Paulina Fusi M.D.   On: 11/25/2020 18:12   ECHOCARDIOGRAM COMPLETE  Result Date: 11/26/2020    ECHOCARDIOGRAM REPORT   Patient Name:   DEAIRAH ARTUSO Date of Exam: 11/26/2020 Medical Rec #:  [id number]      Height:       63.0 in Accession #:    [id number]     Weight:       90.0 lb Date of Birth:  1929/04/26      BSA:          1.377 m Patient Age:    89 years       BP:           143/74 mmHg Patient Gender: F              HR:           87 bpm. Exam Location:  [facility] Procedure: Cardiac Doppler, Color Doppler and 2D Echo Indications:     Stroke I63.9   History:         Patient has no prior history of Echocardiogram examinations.                  CAD.  Sonographer:     Cristela Blue Referring Phys:  [id number] Eliezer Mccoy PATEL Diagnosing Phys: Yvonne Kendall MD  Sonographer Comments: Suboptimal apical window. IMPRESSIONS  1. Left ventricular ejection fraction, by estimation, is 60 to 65%. The left ventricle has normal function. Left ventricular endocardial border not optimally defined to evaluate regional wall motion. There is moderate left ventricular hypertrophy. Left ventricular diastolic parameters are indeterminate.  2. Right ventricular systolic function is normal. The right ventricular size is normal.  3. The mitral valve is normal in structure. No evidence of mitral valve regurgitation. There is borderline mild mitral stenosis. The mean mitral valve gradient is 4.0 mmHg.  4. The aortic valve was not well visualized. Aortic valve regurgitation is not visualized. Mild aortic valve sclerosis is present, with no evidence of aortic valve stenosis. FINDINGS  Left Ventricle: Left ventricular ejection fraction, by estimation, is 60 to 65%. The left ventricle has normal function. Left ventricular endocardial border not optimally defined to evaluate regional wall motion. The left ventricular internal cavity size was normal in size. There is moderate left ventricular hypertrophy. Left ventricular diastolic parameters are indeterminate. Right Ventricle: The right ventricular size is normal. No increase in right ventricular wall thickness. Right ventricular systolic function is normal. Left  Atrium: Left atrial size was normal in size. Right Atrium: Right atrial size was normal in size. Pericardium: The pericardium was not well visualized. Mitral Valve: The mitral valve is normal in structure. No evidence of mitral valve regurgitation. There is borderline mild mitral valve stenosis. MV peak gradient, 12.8 mmHg. The mean mitral valve gradient is 4.0 mmHg. Tricuspid Valve: The  tricuspid valve is normal in structure. Tricuspid valve regurgitation is mild. Aortic Valve: The aortic valve was not well visualized. Aortic valve regurgitation is not visualized. Mild aortic valve sclerosis is present, with no evidence of aortic valve stenosis. Aortic valve mean gradient measures 6.3 mmHg. Aortic valve peak gradient measures 11.7 mmHg. Aortic valve area, by VTI measures 2.18 cm. Pulmonic Valve: The pulmonic valve was not well visualized. Pulmonic valve regurgitation is not visualized. No evidence of pulmonic stenosis. Aorta: The aortic root is normal in size and structure. Pulmonary Artery: The pulmonary artery is not well seen. Venous: The inferior vena cava was not well visualized. IAS/Shunts: The interatrial septum was not well visualized.  LEFT VENTRICLE PLAX 2D LVIDd:         3.10 cm   Diastology LVIDs:         1.90 cm   LV e' medial:    12.10 cm/s LV PW:         1.30 cm   LV E/e' medial:  11.4 LV IVS:        1.50 cm   LV e' lateral:   8.92 cm/s LVOT diam:     2.00 cm   LV E/e' lateral: 15.5 LV SV:         58 LV SV Index:   42 LVOT Area:     3.14 cm  RIGHT VENTRICLE RV Basal diam:  2.70 cm RV S prime:     13.20 cm/s TAPSE (M-mode): 2.8 cm LEFT ATRIUM             Index        RIGHT ATRIUM          Index LA diam:        3.90 cm 2.83 cm/m   RA Area:     9.99 cm LA Vol (A2C):   39.1 ml 28.39 ml/m  RA Volume:   19.80 ml 14.38 ml/m LA Vol (A4C):   29.9 ml 21.71 ml/m LA Biplane Vol: 33.7 ml 24.47 ml/m  AORTIC VALVE                     PULMONIC VALVE AV Area (Vmax):    1.74 cm      PV Vmax:        1.09 m/s AV Area (Vmean):   1.68 cm      PV Peak grad:   4.8 mmHg AV Area (VTI):     2.18 cm      RVOT Peak grad: 8 mmHg AV Vmax:           171.00 cm/s AV Vmean:          117.667 cm/s AV VTI:            0.265 m AV Peak Grad:      11.7 mmHg AV Mean Grad:      6.3 mmHg LVOT Vmax:         94.60 cm/s LVOT Vmean:        62.800 cm/s LVOT VTI:          0.184 m LVOT/AV VTI ratio: 0.70  AORTA Ao Root  diam: 2.30 cm MITRAL VALVE                TRICUSPID VALVE MV Area (PHT): 6.83 cm     TR Peak grad:   22.8 mmHg MV Area VTI:   2.66 cm     TR Vmax:        239.00 cm/s MV Peak grad:  12.8 mmHg MV Mean grad:  4.0 mmHg     SHUNTS MV Vmax:       1.79 m/s     Systemic VTI:  0.18 m MV Vmean:      84.7 cm/s    Systemic Diam: 2.00 cm MV Decel Time: 111 msec MV E velocity: 138.00 cm/s MV A velocity: 55.30 cm/s MV E/A ratio:  2.50 Christopher End MD Electronically signed by Yvonne Kendall MD Signature Date/Time: 11/26/2020/1:58:30 PM    Final      Medications   Scheduled Meds:  aspirin  81 mg Oral Daily   atorvastatin  40 mg Oral Daily   clopidogrel  75 mg Oral Daily   Continuous Infusions:  cefTRIAXone (ROCEPHIN)  IV 1 g (11/26/20 1839)       LOS: 2 days    Time spent: 30 minutes    Pennie Banter, DO [facility]  11/27/2020, 7:50 AM      If 7PM-7AM, please contact night-coverage. How to contact the [facility] Attending or Consulting provider 7A - 7P or covering provider during after hours 7P -7A, for this patient?    Check the care team in [facility] and look for a) attending/consulting TRH provider listed and b) the [facility] team listed Log into [email or web address] and use [place]'s universal password to access. If you do not have the password, please contact the hospital operator. Locate the [facility] provider you are looking for under [facility] and page to a number that you can be directly reached. If you still have difficulty reaching the provider, please page the [facility] (Director on Call) for the Hospitalists listed on amion for assistance.

## 2020-11-27 NOTE — Progress Notes (Addendum)
Inpatient Rehabilitation Admissions Coordinator    Inpatient rehab consult received. Noted COVID + 10/17. Patients are eligible to be considered for admit to the [facility] when cleared from airborne precautions by acute MD or Infectious disease regardless of onset day.   I spoke by phone with her daughter in law, Hilda Blades. We discussed goals and expectations of possible Cir admit once airborne precautions removed if she is still in need of CIR level therapies. Elana Alm will discuss with patient and family members preference for venue. I will follow her progress.   Ottie Glazier, RN, MSN Rehab Admissions Coordinator [phone] 11/27/2020 2:19 PM

## 2020-11-27 NOTE — Progress Notes (Signed)
Physical Therapy Treatment Patient Details Name: Madison Brown MRN: [id number] DOB: 03-Jul-1929 Today's Date: 11/27/2020   History of Present Illness 87 y.o. female who presents with slurred speech and balance difficulties. MRI brain revealed: acute infarction affecting the right posterior parietal lobe, subacute infarction of the left occipital lobe, old infarction of the right occipital lobe, and numerous old small vessel infarctions affecting both cerebellar hemispheres. Pt has past medical history of CAD, HTN, and Hyperlipidemia    PT Comments    Pt was asleep in supine upon arriving. She easily awakes and agrees to session. Pt is oriented to hospital however unaware of time, or reason for being in hospital. She was able to exit L side of bed, stand with +1 HHA, and ambulate 20 ft to BR. Successfully urinated prior to ambulation back to recliner.  Pt present with severe balance/visual deficits that makes her a high fall risk. Also presents with poor coordination and dexterity with all functional task.  Highly recommend DC to CIR to address deficits while maximizing independence with ADLs. RN aware of pt's abilities. She is in chair with call bell in reach and chair alarm in place.    Recommendations for follow up therapy are one component of a multi-disciplinary discharge planning process, led by the attending physician.  Recommendations may be updated based on patient status, additional functional criteria and insurance authorization.  Follow Up Recommendations  CIR     Equipment Recommendations  Other (comment) (defer to next level of care)       Precautions / Restrictions Precautions Precautions: Fall Restrictions Weight Bearing Restrictions: No     Mobility  Bed Mobility Overal bed mobility: Needs Assistance Bed Mobility: Supine to Sit     Supine to sit: Min assist;HOB elevated     General bed mobility comments: pt was able to exit L side of bed with min assist. slight L  lateral lean however with vcs is able to correct.    Transfers Overall transfer level: Needs assistance Equipment used: 1 person hand held assist;None Transfers: Sit to/from Stand Sit to Stand: Min assist;Mod assist         General transfer comment: pt was able to stand with min-mod assist of one. She has unsteadiness throughout all standing activity. Severe visual and balance deficits make her high fall risk.  Ambulation/Gait Ambulation/Gait assistance: Min assist;Mod assist Gait Distance (Feet): 20 Feet Assistive device: 1 person hand held assist;None Gait Pattern/deviations: Staggering left;Staggering right;Step-through pattern Gait velocity: decreased   General Gait Details: pt was able to ambulate 2 x 20 ft with HHA +1. pt requires several occasions of intervention to prevent fall. Severe dynamic balance deficits     Balance Overall balance assessment: Needs assistance Sitting-balance support: No upper extremity supported;Feet supported Sitting balance-Leahy Scale: Fair Sitting balance - Comments: L lateral lean upon sitting up. needs vcs to sit without lean   Standing balance support: Single extremity supported;During functional activity Standing balance-Leahy Scale: Poor Standing balance comment: pt is extremely high fall risk. recommend RN staff use RW. Therapy will continue to progress balance as able per current POC       Cognition Arousal/Alertness: Awake/alert Behavior During Therapy: WFL for tasks assessed/performed Overall Cognitive Status: Impaired/Different from baseline      General Comments: Pt was asleep upon arriving however easily awakes and agrees to PT session. She is oriented to hospital however disoruiented to time, date, and reason for being in hospital. Reoriented pt prior to pt getting OOB."  Pertinent Vitals/Pain Pain Assessment: No/denies pain     PT Goals (current goals can now be found in the care plan section) Acute Rehab  PT Goals Patient Stated Goal: to go home and see my husband." Progress towards PT goals: Progressing toward goals    Frequency    7X/week      PT Plan Current plan remains appropriate       AM-PAC PT "6 Clicks" Mobility   Outcome Measure  Help needed turning from your back to your side while in a flat bed without using bedrails?: A Little Help needed moving from lying on your back to sitting on the side of a flat bed without using bedrails?: A Little Help needed moving to and from a bed to a chair (including a wheelchair)?: A Lot Help needed standing up from a chair using your arms (e.g., wheelchair or bedside chair)?: A Lot Help needed to walk in hospital room?: A Lot Help needed climbing 3-5 steps with a railing? : A Lot 6 Click Score: 14    End of Session Equipment Utilized During Treatment: Gait belt Activity Tolerance: Patient tolerated treatment well Patient left: in chair;with call bell/phone within reach;with chair alarm set Nurse Communication: Mobility status PT Visit Diagnosis: Hemiplegia and hemiparesis;Muscle weakness (generalized) (M62.81) Hemiplegia - Right/Left: Right Hemiplegia - dominant/non-dominant: Dominant Hemiplegia - caused by: Cerebral infarction     Time: 8366-2947 PT Time Calculation (min) (ACUTE ONLY): 30 min  Charges:  $Gait Training: 8-22 mins $Neuromuscular Re-education: 8-22 mins                     Jetta Lout PTA 11/27/20, 8:46 AM

## 2020-11-28 DIAGNOSIS — R4781 Slurred speech: Secondary | ICD-10-CM | POA: Diagnosis not present

## 2020-11-28 LAB — CBC
HCT: 31.9 % — ABNORMAL LOW (ref 36.0–46.0)
Hemoglobin: 10.5 g/dL — ABNORMAL LOW (ref 12.0–15.0)
MCH: 29.6 pg (ref 26.0–34.0)
MCHC: 32.9 g/dL (ref 30.0–36.0)
MCV: 89.9 fL (ref 80.0–100.0)
Platelets: 156 10*3/uL (ref 150–400)
RBC: 3.55 MIL/uL — ABNORMAL LOW (ref 3.87–5.11)
RDW: 13.4 % (ref 11.5–15.5)
WBC: 10.2 10*3/uL (ref 4.0–10.5)
nRBC: 0 % (ref 0.0–0.2)

## 2020-11-28 LAB — BASIC METABOLIC PANEL
Anion gap: 5 (ref 5–15)
BUN: 21 mg/dL (ref 8–23)
CO2: 27 mmol/L (ref 22–32)
Calcium: 8.6 mg/dL — ABNORMAL LOW (ref 8.9–10.3)
Chloride: 105 mmol/L (ref 98–111)
Creatinine, Ser: 1.14 mg/dL — ABNORMAL HIGH (ref 0.44–1.00)
GFR, Estimated: 46 mL/min — ABNORMAL LOW (ref >60)
Glucose, Bld: 107 mg/dL — ABNORMAL HIGH (ref 70–99)
Potassium: 3.8 mmol/L (ref 3.5–5.1)
Sodium: 137 mmol/L (ref 135–145)

## 2020-11-28 LAB — MAGNESIUM: Magnesium: 2.1 mg/dL (ref 1.7–2.4)

## 2020-11-28 LAB — HEMOGLOBIN A1C
Hgb A1c MFr Bld: 6.3 % — ABNORMAL HIGH (ref 4.8–5.6)
Mean Plasma Glucose: 134 mg/dL

## 2020-11-28 NOTE — Progress Notes (Signed)
Occupational Therapy Treatment Patient Details Name: Madison Brown MRN: [id number] DOB: [birth year] Today's Date: 11/28/2020   History of present illness 87 y.o. female who presents with slurred speech and balance difficulties. MRI brain revealed: acute infarction affecting the right posterior parietal lobe, subacute infarction of the left occipital lobe, old infarction of the right occipital lobe, and numerous old small vessel infarctions affecting both cerebellar hemispheres. Pt has past medical history of CAD, HTN, and Hyperlipidemia   OT comments  Chart reviewed, pt greeted in chair with daughter in law present, agreeable to OT tx session. Treatment session targeted improving BUE functional use via neuromuscular re-ed, functional mobility, improving safety and strength during ADL completion. Pt appears to be progressing in all goal areas, improved walker use per daughter in law report. Pt required frequent vcs throughout tx session for safe ADL completion. Discharge recommendation is updated to CIR at this time. Pt is left as received, NAD all needs met. OT will continue to follow while admitted.    Recommendations for follow up therapy are one component of a multi-disciplinary discharge planning process, led by the attending physician.  Recommendations may be updated based on patient status, additional functional criteria and insurance authorization.    Follow Up Recommendations  CIR    Equipment Recommendations  3 in 1 bedside commode;Tub/shower seat       Precautions / Restrictions Precautions Precautions: Fall Restrictions Weight Bearing Restrictions: No       Mobility Bed Mobility               General bed mobility comments: in recliner prior to and after session    Transfers Overall transfer level: Needs assistance Equipment used: Rolling walker (2 wheeled) Transfers: Sit to/from Stand Sit to Stand: Min guard;Min assist         General transfer comment: CGA  for STS from chair, MIN A from STS from commode    Balance Overall balance assessment: Needs assistance Sitting-balance support: No upper extremity supported;Feet supported Sitting balance-Leahy Scale: Good     Standing balance support: Bilateral upper extremity supported;During functional activity Standing balance-Leahy Scale: Fair                             ADL either performed or assessed with clinical judgement   ADL Overall ADL's : Needs assistance/impaired                                       General ADL Comments: CGA for functional mobility to restroom, CGA for peri care, CGA at sink to wash hands with RW, vcs required throughout for sequencing and safety;      Cognition Arousal/Alertness: Awake/alert Behavior During Therapy: WFL for tasks assessed/performed Overall Cognitive Status: Impaired/Different from baseline                                 General Comments: Orientation appears to be improving; Pt is oriented to self, situation on first ask; oriented to self, place, situation second attempt; pt is oriented to year given 3 choices, not oriented to date despite cueing        Exercises General Exercises - Upper Extremity Shoulder Flexion: AAROM;10 reps (2x;) Other Exercises Other Exercises: 10x 2 sets B hands pushed together to provide proprioceptive input, facilitate UE strengthening  for ADL completion           Pertinent Vitals/ Pain       Pain Assessment: No/denies pain Faces Pain Scale: No hurt   Frequency  Min 2X/week        Progress Toward Goals  OT Goals(current goals can now be found in the care plan section)  Progress towards OT goals: Progressing toward goals  Acute Rehab OT Goals Patient Stated Goal: to go home  Plan Discharge plan needs to be updated       AM-PAC OT "6 Clicks" Daily Activity     Outcome Measure   Help from another person eating meals?: None Help from another person  taking care of personal grooming?: A Little Help from another person toileting, which includes using toliet, bedpan, or urinal?: A Little Help from another person bathing (including washing, rinsing, drying)?: A Little Help from another person to put on and taking off regular upper body clothing?: A Little Help from another person to put on and taking off regular lower body clothing?: A Little 6 Click Score: 19    End of Session Equipment Utilized During Treatment: Gait belt;Rolling walker  OT Visit Diagnosis: Unsteadiness on feet (R26.81);History of falling (Z91.81)   Activity Tolerance Patient tolerated treatment well   Patient Left in chair;with call bell/phone within reach;with chair alarm set;with family/visitor present   Nurse Communication Mobility status        Time: 7127-8718 OT Time Calculation (min): 24 min  Charges: OT General Charges $OT Visit: 1 Visit OT Treatments $Self Care/Home Management : 8-22 mins $Neuromuscular Re-education: 8-22 mins  Shanon Payor, OTD OTR/L  11/28/20, 4:00 PM

## 2020-11-28 NOTE — Progress Notes (Signed)
PT Cancellation Note  Patient Details Name: Madison Brown MRN: [id number] DOB: 1929/10/08   Cancelled Treatment:     PT attempt. Pt's family member currently assisting pt with bath. Will return at a more appropriate time.    [name] 11/28/2020, 1:29 PM

## 2020-11-28 NOTE — Progress Notes (Signed)
Physical Therapy Treatment Patient Details Name: Madison Brown MRN: [id number] DOB: 1929-05-16 Today's Date: 11/28/2020   History of Present Illness 87 y.o. female who presents with slurred speech and balance difficulties. MRI brain revealed: acute infarction affecting the right posterior parietal lobe, subacute infarction of the left occipital lobe, old infarction of the right occipital lobe, and numerous old small vessel infarctions affecting both cerebellar hemispheres. Pt has past medical history of CAD, HTN, and Hyperlipidemia    PT Comments    Pt was sitting in recliner with DIL at bedside. Pt is A and O x 3 and agreeable to session. Does have poor safety awareness that increases pt's likelihood of falling.Per family, pt has been unwilling to use RW at home. Pt was able to stand and ambulate with RW ~ 65 ft with slow , narrow (even scissoring at times) gait pattern. Overall, pt will greatly benefit from continued skilled PT at rehab to address deficits while maximizing independence with ADLs.     Recommendations for follow up therapy are one component of a multi-disciplinary discharge planning process, led by the attending physician.  Recommendations may be updated based on patient status, additional functional criteria and insurance authorization.  Follow Up Recommendations  SNF     Equipment Recommendations  Other (comment) (defer to next level of care)       Precautions / Restrictions Precautions Precautions: Fall Restrictions Weight Bearing Restrictions: No     Mobility  Bed Mobility    General bed mobility comments: In recliner pre/post session    Transfers Overall transfer level: Needs assistance Equipment used: Rolling walker (2 wheeled) Transfers: Sit to/from Stand Sit to Stand: Min guard;Min assist         General transfer comment: pr struggles with placement of UE due to visual deficits. Stood with min assist for safety  Ambulation/Gait Ambulation/Gait  assistance: Min guard;Min Chemical engineer (Feet): 65 Feet Assistive device: Rolling walker (2 wheeled) Gait Pattern/deviations: Staggering left;Staggering right;Step-through pattern;Drifts right/left;Trunk flexed;Narrow base of support Gait velocity: decreased   General Gait Details: Pt was able to ambulate several laps around room with use of RW. continues to be high fall risk. per DIL, pt has been unwilling to use RW at home. Pt did state she would use one if recommended to.     Balance Overall balance assessment: Needs assistance Sitting-balance support: No upper extremity supported;Feet supported Sitting balance-Leahy Scale: Fair     Standing balance support: Single extremity supported;During functional activity Standing balance-Leahy Scale: Poor Standing balance comment: high fall risk due to poor safety awareness and dynamic balance deficits      Cognition Arousal/Alertness: Awake/alert Behavior During Therapy: WFL for tasks assessed/performed Overall Cognitive Status: Impaired/Different from baseline (per DIL " Cognition is definitly off.")      General Comments: Pt is alert and able to follow simple commands however pt's cognition continues to be altered.      Exercises General Exercises - Upper Extremity Shoulder Flexion: AAROM;10 reps (2x;) Other Exercises Other Exercises: 10x 2 sets B hands pushed together to provide proprioceptive input, facilitate UE strengthening for ADL completion        Pertinent Vitals/Pain Pain Assessment: No/denies pain Faces Pain Scale: No hurt     PT Goals (current goals can now be found in the care plan section) Acute Rehab PT Goals Patient Stated Goal: to go home Progress towards PT goals: Progressing toward goals    Frequency    7X/week      PT Plan  Current plan remains appropriate       AM-PAC PT "6 Clicks" Mobility   Outcome Measure  Help needed turning from your back to your side while in a flat bed without  using bedrails?: A Little Help needed moving from lying on your back to sitting on the side of a flat bed without using bedrails?: A Little Help needed moving to and from a bed to a chair (including a wheelchair)?: A Little Help needed standing up from a chair using your arms (e.g., wheelchair or bedside chair)?: A Little Help needed to walk in hospital room?: A Little Help needed climbing 3-5 steps with a railing? : A Lot 6 Click Score: 17    End of Session Equipment Utilized During Treatment: Gait belt Activity Tolerance: Patient tolerated treatment well Patient left: in chair;with call bell/phone within reach;with bed alarm set Nurse Communication: Mobility status PT Visit Diagnosis: Hemiplegia and hemiparesis;Muscle weakness (generalized) (M62.81) Hemiplegia - Right/Left: Right Hemiplegia - dominant/non-dominant: Dominant Hemiplegia - caused by: Cerebral infarction     Time: 1410-1433 PT Time Calculation (min) (ACUTE ONLY): 23 min  Charges:  $Gait Training: 8-22 mins $Therapeutic Activity: 8-22 mins                    Jetta Lout PTA 11/28/20, 4:43 PM

## 2020-11-28 NOTE — Progress Notes (Signed)
PROGRESS NOTE    Madison Brown   ERD:[id number]  DOB: 1929-10-30  PCP: Enid Baas, MD    DOA: 11/25/2020 LOS: 3    Brief Narrative / Hospital Course to Date:   Madison Brown is a 87 y.o. female seen in ed with complaints of Slurred speech and off balance  today morning. Last night pt went to bed at baseline and about 4 am she was not herself and had slurred speech and off balance. Pt reported to be leaning to the right side. Both daughter at bedside. Pt lives with spouse and at about 4 am when she got up to use restroom she was weak on right side and  knocked things down  by leaning on them  due to lack of balance.   Patient was found to have a subacute left occipital lobe stroke, an old right occipital and acute right posterior parietal stroke.  Neurology consulted.  Patient incidentally tested positive for Covid-19, without any complaints of respiratory symptoms.   Started on antibiotic for Klebsiella UTI.  Assessment & Plan   Principal Problem:   Slurred speech Active Problems:   CAD (coronary artery disease)   Essential hypertension, benign   Acute CVA (cerebrovascular accident) (HCC)   COVID-19 virus infection   Hx of CABG   S/P aortic valve replacement with bioprosthetic valve   Dementia (HCC)   Subacute CVA  Symptoms began early AM of 10/17.  MRI shows acute right posterior parietal lobe and subacute left occipital strokes.  CTA head and neck without large vessel occlusions. Passed swallow evaluation.  No events on tele.  --Neurology consulted -- Echo findings below --Telemetry --PT/OT - PT recommends CIR for rehab at discharge --Plavix loaded 300 mg, continue daily Plavix 75 mg --Full dose ASA given, continue ASA 81 mg daily --Continue high-intensity statin: Lipitor 80 mg daily  ECHO 11/26/2020  1. Left ventricular ejection fraction, by estimation, is 60 to 65%. The left ventricle has normal function. Left ventricular endocardial border not  optimally defined to evaluate regional wall motion. There is moderate left ventricular hypertrophy. Left  ventricular diastolic parameters are indeterminate.   2. Right ventricular systolic function is normal. The right ventricular size is normal.   3. The mitral valve is normal in structure. No evidence of mitral valve regurgitation. There is borderline mild mitral stenosis. The mean mitral valve gradient is 4.0 mmHg.   4. The aortic valve was not well visualized. Aortic valve regurgitation is not visualized. Mild aortic valve sclerosis is present, with no  evidence of aortic valve stenosis.    Hypokalemia -replacing potassium for K2.9 today (10/19).  Monitor BMP and replace as needed.  Covid-19 Infection - Incidental positive screen on admission.  Patient asymptomatic. - holding on paxlovid given need to continue atorvastatin   Elevated Hbg A1c / Prediabetes - Recent Hbg A1c 6.6%.  Repeat A1c this admission is 6.0%. Recommend monitoring as outpatient.   Hypertension - BP mildly elevated. - hold home amlodipine, relative permissive htn for now   History aortic stenosis s/p bioprosthetic aortic valve replacement - appears compensated Echo findings as above. Monitor.   CAD s/p 2-vessel CABG - stable, no active chest pain. Continue statin, aspirin   Dementia without behavioral disturbances.  Delirium precautions.   Underweight: Body mass index is 15.94 kg/m.   DVT prophylaxis: enoxaparin (LOVENOX) injection 30 mg Start: 11/27/20 2200 SCD's Start: 11/25/20 2106   Diet:  Diet Orders (From admission, onward)     Start  Ordered   11/26/20 1027  DIET DYS 3 Room service appropriate? Yes with Assist; Fluid consistency: Thin  Diet effective now       Comments: Extra Gravy on meats, potatoes.  NO STRAWS!!! Pt can have chicken fingers per Speech (plz cut at little!) Cream Soups best.  Question Answer Comment  Room service appropriate? Yes with Assist   Fluid consistency: Thin       11/26/20 1028              Code Status: Full Code   Subjective 11/28/20    Patient sitting up in recliner when seen on rounds today.  She reports feeling well.  Says her cough is better.  Denies fevers or chills, shortness of breath or chest pain, nausea vomiting or other acute complaints.   Disposition Plan & Communication   Status is: Inpatient  Remains inpatient appropriate because: Requires CIR for rehab once off COVID isolation precautions    Family Communication: Daughter-in-law at bedside on rounds 10/19   Consults, Procedures, Significant Events   Consultants:  Neurology  Procedures:  Echo 11/27/2020, findings above  Antimicrobials:  Anti-infectives (From admission, onward)    Start     Dose/Rate Route Frequency Ordered Stop   11/27/20 1000  ceFAZolin (ANCEF) IVPB 1 g/50 mL premix        1 g 100 mL/hr over 30 Minutes Intravenous Every 12 hours 11/27/20 0916 11/30/20 0959   11/26/20 1800  cefTRIAXone (ROCEPHIN) 1 g in sodium chloride 0.9 % 100 mL IVPB  Status:  Discontinued        1 g 200 mL/hr over 30 Minutes Intravenous Every 24 hours 11/25/20 2108 11/27/20 0916   11/25/20 1815  cefTRIAXone (ROCEPHIN) 1 g in sodium chloride 0.9 % 100 mL IVPB        1 g 200 mL/hr over 30 Minutes Intravenous  Once 11/25/20 1807 11/25/20 1958         Micro    Objective   Vitals:   11/27/20 2023 11/28/20 0320 11/28/20 0800 11/28/20 1208  BP: (!) 145/69 133/62 136/67 (!) 122/56  Pulse: 92 76 81 79  Resp: 16 15 20 18   Temp: 98.2 F (36.8 C) 98.3 F (36.8 C) (!) 97.5 F (36.4 C) 98.6 F (37 C)  TempSrc: Oral  Oral Oral  SpO2: 98% 99% 96% 96%  Weight:      Height:        Intake/Output Summary (Last 24 hours) at 11/28/2020 1508 Last data filed at 11/27/2020 2330 Gross per 24 hour  Intake 50 ml  Output --  Net 50 ml   Filed Weights   11/26/20 0201  Weight: 40.8 kg    Physical Exam:  General exam: awake, alert, no acute distress, frail and  underweight Respiratory system: CTAB, no wheezes, rales or rhonchi, normal respiratory effort. Cardiovascular system: normal S1/S2, RRR, no JVD, murmurs, rubs, gallops, no pedal edema.   Central nervous system: no gross focal neurologic deficits, normal speech Psychiatry: normal mood, congruent affect, abnormal judgment and insight due to dementia  Labs   Data Reviewed: I have personally reviewed following labs and imaging studies  CBC: Recent Labs  Lab 11/25/20 1056 11/27/20 0358 11/28/20 0437  WBC 10.8* 12.2* 10.2  NEUTROABS 8.5*  --   --   HGB 12.3 10.9* 10.5*  HCT 37.1 33.4* 31.9*  MCV 89.2 88.6 89.9  PLT 173 153 156   Basic Metabolic Panel: Recent Labs  Lab 11/25/20 1056 11/27/20 0358 11/28/20 0437  NA 135 135 137  K 3.7 2.9* 3.8  CL 97* 100 105  CO2 27 29 27   GLUCOSE 106* 123* 107*  BUN 12 15 21   CREATININE 1.24* 1.24* 1.14*  CALCIUM 9.1 8.2* 8.6*  MG  --   --  2.1   GFR: Estimated Creatinine Clearance: 21.5 mL/min (A) (by C-G formula based on SCr of 1.14 mg/dL (H)). Liver Function Tests: Recent Labs  Lab 11/25/20 1056  AST 15  ALT 11  ALKPHOS 88  BILITOT 0.6  PROT 8.0  ALBUMIN 3.9   No results for input(s): LIPASE, AMYLASE in the last 168 hours. No results for input(s): AMMONIA in the last 168 hours. Coagulation Profile: Recent Labs  Lab 11/25/20 1056  INR 1.1   Cardiac Enzymes: No results for input(s): CKTOTAL, CKMB, CKMBINDEX, TROPONINI in the last 168 hours. BNP (last 3 results) No results for input(s): PROBNP in the last 8760 hours. HbA1C: Recent Labs    11/26/20 0514 11/27/20 0358  HGBA1C 6.0* 6.3*   CBG: No results for input(s): GLUCAP in the last 168 hours. Lipid Profile: Recent Labs    11/26/20 0514  CHOL 120  HDL 47  LDLCALC 62  TRIG 56  CHOLHDL 2.6   Thyroid Function Tests: No results for input(s): TSH, T4TOTAL, FREET4, T3FREE, THYROIDAB in the last 72 hours. Anemia Panel: No results for input(s): VITAMINB12,  FOLATE, FERRITIN, TIBC, IRON, RETICCTPCT in the last 72 hours. Sepsis Labs: No results for input(s): PROCALCITON, LATICACIDVEN in the last 168 hours.  Recent Results (from the past 240 hour(s))  Resp Panel by RT-PCR (Flu A&B, Covid) Urine, Clean Catch     Status: Abnormal   Collection Time: 11/25/20  5:29 PM   Specimen: Urine, Clean Catch; Nasopharyngeal(NP) swabs in vial transport medium  Result Value Ref Range Status   SARS Coronavirus 2 by RT PCR POSITIVE (A) NEGATIVE Final    Comment: RESULT CALLED TO, READ BACK BY AND VERIFIED WITH: MATT BASSETT @1954  ON 11/25/20 [facility] (NOTE) SARS-CoV-2 target nucleic acids are DETECTED.  The SARS-CoV-2 RNA is generally detectable in upper respiratory specimens during the acute phase of infection. Positive results are indicative of the presence of the identified virus, but do not rule out bacterial infection or co-infection with other pathogens not detected by the test. Clinical correlation with patient history and other diagnostic information is necessary to determine patient infection status. The expected result is Negative.  Fact Sheet for Patients: 11/27/20  Fact Sheet for Healthcare Providers:  This test is not yet approved or cleared by the 11/27/20 FDA and  has been authorized for detection and/or diagnosis of SARS-CoV-2 by FDA under an Emergency Use Authorization (EUA).  This EUA will remain in effect (meaning this test can b e used) for the duration of  the COVID-19 declaration under Section 564(b)(1) of the Act, 21 U.S.C. section 360bbb-3(b)(1), unless the authorization is terminated or revoked sooner.     Influenza A by PCR NEGATIVE NEGATIVE Final   Influenza B by PCR NEGATIVE NEGATIVE Final    Comment: (NOTE) The Xpert Xpress SARS-CoV-2/FLU/RSV plus assay is intended as an aid in the diagnosis of influenza from Nasopharyngeal swab specimens  and should not be used as a sole basis for treatment. Nasal washings and aspirates are unacceptable for Xpert Xpress SARS-CoV-2/FLU/RSV testing.  Fact Sheet for Patients: BloggerCourse.com  Fact Sheet for Healthcare Providers: SeriousBroker.it  This test is not yet approved or cleared by the [place] and has been authorized  for detection and/or diagnosis of SARS-CoV-2 by FDA under an Emergency Use Authorization (EUA). This EUA will remain in effect (meaning this test can be used) for the duration of the COVID-19 declaration under Section 564(b)(1) of the Act, 21 U.S.C. section 360bbb-3(b)(1), unless the authorization is terminated or revoked.  Performed at [facility], [address] [place], [place] [address]   Urine Culture     Status: Abnormal   Collection Time: 11/25/20  5:29 PM   Specimen: Urine, Random  Result Value Ref Range Status   Specimen Description   Final    URINE, RANDOM Performed at [facility], [address] [place], [place] [address]    Special Requests   Final    NONE Performed at [facility], [address] [place], [place] [address]    Culture >=100,000 COLONIES/mL KLEBSIELLA PNEUMONIAE (A)  Final   Report Status 11/27/2020 FINAL  Final   Organism ID, Bacteria KLEBSIELLA PNEUMONIAE (A)  Final      Susceptibility   Klebsiella pneumoniae - MIC*    AMPICILLIN RESISTANT Resistant     CEFAZOLIN <=4 SENSITIVE Sensitive     CEFEPIME <=0.12 SENSITIVE Sensitive     CEFTRIAXONE <=0.25 SENSITIVE Sensitive     CIPROFLOXACIN <=0.25 SENSITIVE Sensitive     GENTAMICIN <=1 SENSITIVE Sensitive     IMIPENEM <=0.25 SENSITIVE Sensitive     NITROFURANTOIN <=16 SENSITIVE Sensitive     TRIMETH/SULFA <=20 SENSITIVE Sensitive     AMPICILLIN/SULBACTAM <=2 SENSITIVE Sensitive     PIP/TAZO <=4 SENSITIVE Sensitive     * >=100,000 COLONIES/mL KLEBSIELLA PNEUMONIAE  Blood culture  (routine x 2)     Status: None (Preliminary result)   Collection Time: 11/25/20  7:00 PM   Specimen: BLOOD  Result Value Ref Range Status   Specimen Description BLOOD BLOOD RIGHT FOREARM  Final   Special Requests   Final    BOTTLES DRAWN AEROBIC AND ANAEROBIC Blood Culture results may not be optimal due to an inadequate volume of blood received in culture bottles   Culture   Final    NO GROWTH 3 DAYS Performed at [facility], [address] [place], [place] [address]    Report Status PENDING  Incomplete  Culture, blood (Routine X 2) w Reflex to ID Panel     Status: None (Preliminary result)   Collection Time: 11/26/20 12:06 AM   Specimen: BLOOD  Result Value Ref Range Status   Specimen Description BLOOD LEFT ASSIST CONTROL  Final   Special Requests   Final    BOTTLES DRAWN AEROBIC ONLY Blood Culture adequate volume   Culture   Final    NO GROWTH 2 DAYS Performed at [facility], [address], [place], [place] [address]    Report Status PENDING  Incomplete      Imaging Studies   No results found.   Medications   Scheduled Meds:  aspirin  81 mg Oral Daily   atorvastatin  40 mg Oral Daily   clopidogrel  75 mg Oral Daily   enoxaparin (LOVENOX) injection  30 mg Subcutaneous Daily   feeding supplement  237 mL Oral BID BM   Continuous Infusions:   ceFAZolin (ANCEF) IV 1 g (11/28/20 0909)       LOS: 3 days    Time spent: 20 minutes    Pennie Banter, DO [facility]  11/28/2020, 3:08 PM      If 7PM-7AM, please contact night-coverage. How to contact the  [facility] Attending or Consulting provider 7A - 7P or covering provider during after hours 7P -7A, for this patient?    Check the care team in [facility] and look for a) attending/consulting TR[facility] provider listed and b) the [facility] team listed Log into [email or web address] and use [place]'s universal password to access. If you do not have the password, please contact the hospital operator. Locate the  [facility] provider you are looking for under [facility] and page to a number that you can be directly reached. If you still have difficulty reaching the provider, please page the [facility] (Director on Call) for the Hospitalists listed on amion for assistance.

## 2020-11-28 NOTE — TOC Initial Note (Signed)
Transition of Care [facility]) - Initial/Assessment Note    Patient Details  Name: Madison Brown MRN: [id number] Date of Birth: Sep 26, 1929  Transition [facility]) CM/SW Contact:    Caryn Section, RN Phone Number: 11/28/2020, 4:12 PM  Clinical Narrative:    patient lives at home with husband.  Was very independent prior to CVA.  At this point patient can sit and shuffle, and has had a major life change.  Patient has dementia and is showing signs of forgetfulness, but wants to be independent again.            Patient's daughter in law states that placement in SNF would be a best choice at this point.  Likely [facility].  Patient is still in [employer], will work on SNF.  Expected Discharge Plan: IP Rehab Facility (CIR recommended and will evaluate)     Patient Goals and CMS Choice        Expected Discharge Plan and Services Expected Discharge Plan: IP Rehab Facility (CIR recommended and will evaluate)     Post Acute Care Choice:  (TBD)                                        Prior Living Arrangements/Services     Patient language and need for interpreter reviewed:: Yes        Need for Family Participation in Patient Care: Yes (Comment) Care giver support system in place?: Yes (comment)   Criminal Activity/Legal Involvement Pertinent to Current Situation/Hospitalization: No - Comment as needed  Activities of Daily Living Home Assistive Devices/Equipment: None ADL Screening (condition at time of admission) Patient's cognitive ability adequate to safely complete daily activities?: Yes Is the patient deaf or have difficulty hearing?: No Does the patient have difficulty seeing, even when wearing glasses/contacts?: No Does the patient have difficulty concentrating, remembering, or making decisions?: No Patient able to express need for assistance with ADLs?: Yes Does the patient have difficulty dressing or bathing?: No Independently performs ADLs?:  Yes (appropriate for developmental age) Does the patient have difficulty walking or climbing stairs?: Yes Weakness of Legs: Both Weakness of Arms/Hands: None  Permission Sought/Granted Permission sought to share information with : Case Manager Permission granted to share information with : Yes, Verbal Permission Granted     Permission granted to share info w AGENCY: [facility]        Emotional Assessment Appearance::  (Spoke to granddaughter, patient seeing neurologist.) Attitude/Demeanor/Rapport:  (Spoke to granddaughter, patient seeing neurologist) Affect (typically observed):  (Patient in examination) Orientation: : Oriented to Self, Oriented to Place, Oriented to  Time, Oriented to Situation Alcohol / Substance Use: Not Applicable Psych Involvement: No (comment)  Admission diagnosis:  Slurred speech [R47.81] Urinary tract infection without hematuria, site unspecified [N39.0] Cerebrovascular accident (CVA), unspecified mechanism (HCC) [I63.9] Patient Active Problem List   Diagnosis Date Noted   Hx of CABG 11/26/2020   S/P aortic valve replacement with bioprosthetic valve 11/26/2020   Dementia (HCC) 11/26/2020   TIA (transient ischemic attack) 11/25/2020   Acute CVA (cerebrovascular accident) (HCC) 11/25/2020   COVID-19 virus infection 11/25/2020   Slurred speech 11/25/2020   Underweight 02/23/2017   Essential hypertension, benign 03/30/2013   Other and unspecified hyperlipidemia 03/30/2013   Encounter for preventive health examination 10/06/2012   CAD (coronary artery disease) 06/22/2011   PCP:  Enid Baas, MD Pharmacy:  [facility] [phone] [place], [place] - [address] [place] [address] Phone: [phone] Fax: [phone]  [facility] [phone] [place], [place] - [address] [place] [address] Phone: [phone] Fax: [phone]     Social Determinants of Health (SDOH)  Interventions    Readmission Risk Interventions No flowsheet data found.

## 2020-11-29 DIAGNOSIS — R4781 Slurred speech: Secondary | ICD-10-CM | POA: Diagnosis not present

## 2020-11-29 LAB — BASIC METABOLIC PANEL
Anion gap: 6 (ref 5–15)
BUN: 17 mg/dL (ref 8–23)
CO2: 28 mmol/L (ref 22–32)
Calcium: 8.3 mg/dL — ABNORMAL LOW (ref 8.9–10.3)
Chloride: 102 mmol/L (ref 98–111)
Creatinine, Ser: 1.02 mg/dL — ABNORMAL HIGH (ref 0.44–1.00)
GFR, Estimated: 53 mL/min — ABNORMAL LOW (ref >60)
Glucose, Bld: 102 mg/dL — ABNORMAL HIGH (ref 70–99)
Potassium: 3.4 mmol/L — ABNORMAL LOW (ref 3.5–5.1)
Sodium: 136 mmol/L (ref 135–145)

## 2020-11-29 LAB — CBC
HCT: 29.9 % — ABNORMAL LOW (ref 36.0–46.0)
Hemoglobin: 10.3 g/dL — ABNORMAL LOW (ref 12.0–15.0)
MCH: 30.8 pg (ref 26.0–34.0)
MCHC: 34.4 g/dL (ref 30.0–36.0)
MCV: 89.5 fL (ref 80.0–100.0)
Platelets: 155 10*3/uL (ref 150–400)
RBC: 3.34 MIL/uL — ABNORMAL LOW (ref 3.87–5.11)
RDW: 13.3 % (ref 11.5–15.5)
WBC: 8.4 10*3/uL (ref 4.0–10.5)
nRBC: 0 % (ref 0.0–0.2)

## 2020-11-29 MED ORDER — AMLODIPINE BESYLATE 10 MG PO TABS
10.0000 mg | ORAL_TABLET | Freq: Every day | ORAL | Status: DC
  Administered 2020-11-29 – 2020-12-06 (×7): 10 mg via ORAL
  Filled 2020-11-29 (×7): qty 1

## 2020-11-29 MED ORDER — SODIUM CHLORIDE 0.9 % IV SOLN: INTRAVENOUS | Status: DC | PRN

## 2020-11-29 MED ORDER — POTASSIUM CHLORIDE CRYS ER 20 MEQ PO TBCR
40.0000 meq | EXTENDED_RELEASE_TABLET | Freq: Once | ORAL | Status: AC
  Administered 2020-11-29: 40 meq via ORAL
  Filled 2020-11-29: qty 4

## 2020-11-29 NOTE — TOC Progression Note (Signed)
Transition of Care [facility]) - Progression Note    Patient Details  Name: Madison Brown MRN: [id number] Date of Birth: 12/16/1929  [facility]) CM/SW Contact  Caryn Section, RN Phone Number: 11/29/2020, 4:50 PM  Clinical Narrative:   Spoke with patient, daughter and daughter in law today.  Spoke about SNF vs CIR.  All in agreement to transfer to CIR.  [facility] at [facility] aware.  TOC to follow    Expected Discharge Plan: IP Rehab Facility (CIR recommended and will evaluate)    Expected Discharge Plan and Services Expected Discharge Plan: IP Rehab Facility (CIR recommended and will evaluate)     Post Acute Care Choice:  (TBD)                                         Social Determinants of Health (SDOH) Interventions    Readmission Risk Interventions No flowsheet data found.

## 2020-11-29 NOTE — Progress Notes (Signed)
PROGRESS NOTE    Madison Brown   ZOX:[id number]  DOB: 1929/06/11  PCP: Enid Baas, MD    DOA: 11/25/2020 LOS: 4    Brief Narrative / Hospital Course to Date:   Madison Brown is a 87 y.o. female seen in ed with complaints of Slurred speech and off balance  today morning. Last night pt went to bed at baseline and about 4 am she was not herself and had slurred speech and off balance. Pt reported to be leaning to the right side. Both daughter at bedside. Pt lives with spouse and at about 4 am when she got up to use restroom she was weak on right side and  knocked things down  by leaning on them  due to lack of balance.   Patient was found to have a subacute left occipital lobe stroke, an old right occipital and acute right posterior parietal stroke.  Neurology consulted.  Patient incidentally tested positive for Covid-19, without any complaints of respiratory symptoms.   Started on antibiotic for Klebsiella UTI.  Assessment & Plan   Principal Problem:   Slurred speech Active Problems:   CAD (coronary artery disease)   Essential hypertension, benign   Acute CVA (cerebrovascular accident) (HCC)   COVID-19 virus infection   Hx of CABG   S/P aortic valve replacement with bioprosthetic valve   Dementia (HCC)   Subacute CVA  Symptoms began early AM of 10/17.  MRI shows acute right posterior parietal lobe and subacute left occipital strokes.  CTA head and neck without large vessel occlusions. Passed swallow evaluation.  No events on tele.  --Neurology consulted -- Echo findings below --Telemetry --PT/OT - PT recommends CIR for rehab at discharge --Plavix loaded 300 mg, continue daily Plavix 75 mg --Full dose ASA given, continue ASA 81 mg daily --Continue high-intensity statin: Lipitor 80 mg daily  ECHO 11/26/2020  1. Left ventricular ejection fraction, by estimation, is 60 to 65%. The left ventricle has normal function. Left ventricular endocardial border not  optimally defined to evaluate regional wall motion. There is moderate left ventricular hypertrophy. Left  ventricular diastolic parameters are indeterminate.   2. Right ventricular systolic function is normal. The right ventricular size is normal.   3. The mitral valve is normal in structure. No evidence of mitral valve regurgitation. There is borderline mild mitral stenosis. The mean mitral valve gradient is 4.0 mmHg.   4. The aortic valve was not well visualized. Aortic valve regurgitation is not visualized. Mild aortic valve sclerosis is present, with no  evidence of aortic valve stenosis.    Hypokalemia -replacing potassium for K2.9 today (10/19).  Monitor BMP and replace as needed.  Covid-19 Infection - Incidental positive screen on admission.  Patient asymptomatic. - holding on paxlovid given need to continue atorvastatin   Elevated Hbg A1c / Prediabetes - Recent Hbg A1c 6.6%.  Repeat A1c this admission is 6.0%. Recommend monitoring as outpatient.   Hypertension - BP mildly elevated. - hold home amlodipine, relative permissive htn for now   History aortic stenosis s/p bioprosthetic aortic valve replacement - appears compensated Echo findings as above. Monitor.   CAD s/p 2-vessel CABG - stable, no active chest pain. Continue statin, aspirin   Dementia without behavioral disturbances.  Delirium precautions.   Underweight: Body mass index is 15.94 kg/m.   DVT prophylaxis: enoxaparin (LOVENOX) injection 30 mg Start: 11/27/20 2200 SCD's Start: 11/25/20 2106   Diet:  Diet Orders (From admission, onward)     Start  Ordered   11/29/20 1327  Diet regular Room service appropriate? Yes with Assist; Fluid consistency: Thin  Diet effective now       Question Answer Comment  Room service appropriate? Yes with Assist   Fluid consistency: Thin      11/29/20 1327              Code Status: Full Code   Subjective 11/29/20    Patient seated edge of bed getting bath with  NT at bedside.  Pt reports she feels well.  No cough, SOB, F/C or other compalints.    Disposition Plan & Communication   Status is: Inpatient  Remains inpatient appropriate because: Requires CIR for rehab once off COVID isolation precautions    Family Communication: Daughter-in-law at bedside on rounds 10/19   Consults, Procedures, Significant Events   Consultants:  Neurology  Procedures:  Echo 11/27/2020, findings above  Antimicrobials:  Anti-infectives (From admission, onward)    Start     Dose/Rate Route Frequency Ordered Stop   11/27/20 1000  ceFAZolin (ANCEF) IVPB 1 g/50 mL premix        1 g 100 mL/hr over 30 Minutes Intravenous Every 12 hours 11/27/20 0916 11/30/20 0959   11/26/20 1800  cefTRIAXone (ROCEPHIN) 1 g in sodium chloride 0.9 % 100 mL IVPB  Status:  Discontinued        1 g 200 mL/hr over 30 Minutes Intravenous Every 24 hours 11/25/20 2108 11/27/20 0916   11/25/20 1815  cefTRIAXone (ROCEPHIN) 1 g in sodium chloride 0.9 % 100 mL IVPB        1 g 200 mL/hr over 30 Minutes Intravenous  Once 11/25/20 1807 11/25/20 1958         Micro    Objective   Vitals:   11/29/20 0617 11/29/20 0752 11/29/20 1122 11/29/20 1614  BP: (!) 145/62 134/67 (!) 154/71 133/65  Pulse: 77 81 77 76  Resp: 16 16 20 16   Temp: 98.9 F (37.2 C) 98.6 F (37 C) 98.2 F (36.8 C) 98.5 F (36.9 C)  TempSrc: Oral Oral Oral Oral  SpO2: 97% 96% 97% 98%  Weight:      Height:        Intake/Output Summary (Last 24 hours) at 11/29/2020 1650 Last data filed at 11/29/2020 12/01/2020 Gross per 24 hour  Intake 112.23 ml  Output 2 ml  Net 110.23 ml   Filed Weights   11/26/20 0201  Weight: 40.8 kg    Physical Exam:  General exam: awake, alert, no acute distress, frail and underweight Respiratory system: CTAB, no wheezes, rales or rhonchi, normal respiratory effort. Cardiovascular system: normal S1/S2, RRR, no JVD, murmurs, rubs, gallops, no pedal edema.   Central nervous system: no  gross focal neurologic deficits, normal speech Psychiatry: normal mood, congruent affect, abnormal judgment and insight due to dementia  Labs   Data Reviewed: I have personally reviewed following labs and imaging studies  CBC: Recent Labs  Lab 11/25/20 1056 11/27/20 0358 11/28/20 0437 11/29/20 0421  WBC 10.8* 12.2* 10.2 8.4  NEUTROABS 8.5*  --   --   --   HGB 12.3 10.9* 10.5* 10.3*  HCT 37.1 33.4* 31.9* 29.9*  MCV 89.2 88.6 89.9 89.5  PLT 173 153 156 155   Basic Metabolic Panel: Recent Labs  Lab 11/25/20 1056 11/27/20 0358 11/28/20 0437 11/29/20 0421  NA 135 135 137 136  K 3.7 2.9* 3.8 3.4*  CL 97* 100 105 102  CO2 27 29 27  28  GLUCOSE 106* 123* 107* 102*  BUN 12 15 21 17   CREATININE 1.24* 1.24* 1.14* 1.02*  CALCIUM 9.1 8.2* 8.6* 8.3*  MG  --   --  2.1  --    GFR: Estimated Creatinine Clearance: 24.1 mL/min (A) (by C-G formula based on SCr of 1.02 mg/dL (H)). Liver Function Tests: Recent Labs  Lab 11/25/20 1056  AST 15  ALT 11  ALKPHOS 88  BILITOT 0.6  PROT 8.0  ALBUMIN 3.9   No results for input(s): LIPASE, AMYLASE in the last 168 hours. No results for input(s): AMMONIA in the last 168 hours. Coagulation Profile: Recent Labs  Lab 11/25/20 1056  INR 1.1   Cardiac Enzymes: No results for input(s): CKTOTAL, CKMB, CKMBINDEX, TROPONINI in the last 168 hours. BNP (last 3 results) No results for input(s): PROBNP in the last 8760 hours. HbA1C: Recent Labs    11/27/20 0358  HGBA1C 6.3*   CBG: No results for input(s): GLUCAP in the last 168 hours. Lipid Profile: No results for input(s): CHOL, HDL, LDLCALC, TRIG, CHOLHDL, LDLDIRECT in the last 72 hours.  Thyroid Function Tests: No results for input(s): TSH, T4TOTAL, FREET4, T3FREE, THYROIDAB in the last 72 hours. Anemia Panel: No results for input(s): VITAMINB12, FOLATE, FERRITIN, TIBC, IRON, RETICCTPCT in the last 72 hours. Sepsis Labs: No results for input(s): PROCALCITON, LATICACIDVEN in the last  168 hours.  Recent Results (from the past 240 hour(s))  Resp Panel by RT-PCR (Flu A&B, Covid) Urine, Clean Catch     Status: Abnormal   Collection Time: 11/25/20  5:29 PM   Specimen: Urine, Clean Catch; Nasopharyngeal(NP) swabs in vial transport medium  Result Value Ref Range Status   SARS Coronavirus 2 by RT PCR POSITIVE (A) NEGATIVE Final    Comment: RESULT CALLED TO, READ BACK BY AND VERIFIED WITH: MATT BASSETT @1952  ON 11/25/20 SKL (NOTE) SARS-CoV-2 target nucleic acids are DETECTED.  The SARS-CoV-2 RNA is generally detectable in upper respiratory specimens during the acute phase of infection. Positive results are indicative of the presence of the identified virus, but do not rule out bacterial infection or co-infection with other pathogens not detected by the test. Clinical correlation with patient history and other diagnostic information is necessary to determine patient infection status. The expected result is Negative.  Fact Sheet for Patients:  Fact Sheet for Healthcare Providers: 11/27/20  This test is not yet approved or cleared by the [employer] FDA and  has been authorized for detection and/or diagnosis of SARS-CoV-2 by FDA under an Emergency Use Authorization (EUA).  This EUA will remain in effect (meaning this test can b e used) for the duration of  the COVID-19 declaration under Section 564(b)(1) of the Act, 21 U.S.C. section 360bbb-3(b)(1), unless the authorization is terminated or revoked sooner.     Influenza A by PCR NEGATIVE NEGATIVE Final   Influenza B by PCR NEGATIVE NEGATIVE Final    Comment: (NOTE) The Xpert Xpress SARS-CoV-2/FLU/RSV plus assay is intended as an aid in the diagnosis of influenza from Nasopharyngeal swab specimens and should not be used as a sole basis for treatment. Nasal washings and aspirates are unacceptable for Xpert Xpress  SARS-CoV-2/FLU/RSV testing.  Fact Sheet for Patients: [employer]ker[employer]it  Fact Sheet for Healthcare Providers: [place]  This test is not yet approved or cleared by the [employer] FDA and has been authorized for detection and/or diagnosis of SARS-CoV-2 by FDA under an Emergency Use Authorization (EUA). This EUA will remain in effect (meaning this test can  be used) for the duration of the COVID-19 declaration under Section 564(b)(1) of the Act, 21 U.S.C. section 360bbb-3(b)(1), unless the authorization is terminated or revoked.  Performed at [facility], [address] [place], [place] [address]   Urine Culture     Status: Abnormal   Collection Time: 11/25/20  5:29 PM   Specimen: Urine, Random  Result Value Ref Range Status   Specimen Description   Final    URINE, RANDOM Performed at [facility], [address] [place], [place] [address]    Special Requests   Final    NONE Performed at [facility], [address] [place], [place] [address]    Culture >=100,000 COLONIES/mL KLEBSIELLA PNEUMONIAE (A)  Final   Report Status 11/27/2020 FINAL  Final   Organism ID, Bacteria KLEBSIELLA PNEUMONIAE (A)  Final      Susceptibility   Klebsiella pneumoniae - MIC*    AMPICILLIN RESISTANT Resistant     CEFAZOLIN <=4 SENSITIVE Sensitive     CEFEPIME <=0.12 SENSITIVE Sensitive     CEFTRIAXONE <=0.25 SENSITIVE Sensitive     CIPROFLOXACIN <=0.25 SENSITIVE Sensitive     GENTAMICIN <=1 SENSITIVE Sensitive     IMIPENEM <=0.25 SENSITIVE Sensitive     NITROFURANTOIN <=16 SENSITIVE Sensitive     TRIMETH/SULFA <=20 SENSITIVE Sensitive     AMPICILLIN/SULBACTAM <=2 SENSITIVE Sensitive     PIP/TAZO <=4 SENSITIVE Sensitive     * >=100,000 COLONIES/mL KLEBSIELLA PNEUMONIAE  Blood culture (routine x 2)     Status: None (Preliminary result)   Collection Time: 11/25/20  7:00 PM   Specimen: BLOOD  Result  Value Ref Range Status   Specimen Description BLOOD BLOOD RIGHT FOREARM  Final   Special Requests   Final    BOTTLES DRAWN AEROBIC AND ANAEROBIC Blood Culture results may not be optimal due to an inadequate volume of blood received in culture bottles   Culture   Final    NO GROWTH 4 DAYS Performed at [facility], [address] [place], [place] [address]    Report Status PENDING  Incomplete  Culture, blood (Routine X 2) w Reflex to ID Panel     Status: None (Preliminary result)   Collection Time: 11/26/20 12:06 AM   Specimen: BLOOD  Result Value Ref Range Status   Specimen Description BLOOD LEFT ASSIST CONTROL  Final   Special Requests   Final    BOTTLES DRAWN AEROBIC ONLY Blood Culture adequate volume   Culture   Final    NO GROWTH 3 DAYS Performed at [facility], [address], [place], [place] [address]    Report Status PENDING  Incomplete      Imaging Studies   No results found.   Medications   Scheduled Meds:  amLODipine  10 mg Oral Daily   aspirin  81 mg Oral Daily   atorvastatin  40 mg Oral Daily   clopidogrel  75 mg Oral Daily   enoxaparin (LOVENOX) injection  30 mg Subcutaneous Daily   feeding supplement  237 mL Oral BID BM   Continuous Infusions:   ceFAZolin (ANCEF) IV 1 g (11/29/20 0907)       LOS: 4 days    Time spent: 20 minutes    Pennie Banter, DO [facility]  11/29/2020, 4:50 PM      If 7PM-7AM, please contact night-coverage. How to contact the [facility] Attending or Consulting provider 7A - 7P or covering provider during after hours 7P -7A,  for this patient?    Check the care team in [facility] and look for a) attending/consulting TRH provider listed and b) the [facility] team listed Log into [email or web address] and use [place]'s universal password to access. If you do not have the password, please contact the hospital operator. Locate the [facility] provider you are looking for under [facility] and page to a number that  you can be directly reached. If you still have difficulty reaching the provider, please page the [facility] (Director on Call) for the Hospitalists listed on amion for assistance.

## 2020-11-29 NOTE — Progress Notes (Signed)
Physical Therapy Treatment Patient Details Name: Madison Brown MRN: [id number] DOB: [birth year] Today's Date: 11/29/2020   History of Present Illness 87 y.o. female who presents with slurred speech and balance difficulties. MRI brain revealed: acute infarction affecting the right posterior parietal lobe, subacute infarction of the left occipital lobe, old infarction of the right occipital lobe, and numerous old small vessel infarctions affecting both cerebellar hemispheres. Pt has past medical history of CAD, HTN, and Hyperlipidemia    PT Comments    Pt was sitting in recliner with DIL and daughter present. She agrees to session and continues to be pleasant and cooperative. Pt does present with cognition deficits and vision deficits. Slightly HOH but follows simple commands consistently throughout. Pt was easily able to stand several times form recliner to RW. Poor carry-over between trials due to short term memory deficits. Overall pt is progressing. She was easily able to ambulate >50 ft with use of RW however has narrow BOS and occasional scissoring when turning. Pt will benefit from continued skilled PT at DC to address deficits while maximizing independence with ADLs.    Recommendations for follow up therapy are one component of a multi-disciplinary discharge planning process, led by the attending physician.  Recommendations may be updated based on patient status, additional functional criteria and insurance authorization.  Follow Up Recommendations  CIR;Other (comment) (pt is now requesting to go to CIR at DC)     Equipment Recommendations  Other (comment) (defer to next level of care)       Precautions / Restrictions Precautions Precautions: Fall Restrictions Weight Bearing Restrictions: No     Mobility  Bed Mobility      General bed mobility comments: pt was in recliner pre/post session    Transfers Overall transfer level: Needs assistance Equipment used: Rolling walker  (2 wheeled) Transfers: Sit to/from Stand Sit to Stand: Min guard         General transfer comment: CGA for safety only. Vcs for handplacement, fwd wt shift, and overall improved technique. poor carry-over between transfers due to short term memory impairments. needs constant/ repeated vcs throughout session for desired  task  Ambulation/Gait Ambulation/Gait assistance: Min guard Gait Distance (Feet): 70 Feet Assistive device: Rolling walker (2 wheeled) Gait Pattern/deviations: Narrow base of support Gait velocity: decreased   General Gait Details: pt was easily able to ambulate 70 ft with RW. continues to have occasional scissoring and narrow BOS throughout. pt is at high fall risk with any/all standing activity. vision continues to play large role in fall risk. Will continue to progress pt to PLOF per current POC.     Balance Overall balance assessment: Needs assistance Sitting-balance support: No upper extremity supported;Feet supported Sitting balance-Leahy Scale: Good Sitting balance - Comments: no LOB or unsteadiness during seated activity today. had pt perform higher level dexterity exercises   Standing balance support: Bilateral upper extremity supported;During functional activity Standing balance-Leahy Scale: Poor Standing balance comment: Continued promoting use of RW for pt safety. pt's family reports pt was resistive to using one at home prior to admission. author feels RW will be safest means of ambulation going forward due to vision and balance deficits.      Cognition Arousal/Alertness: Awake/alert Behavior During Therapy: WFL for tasks assessed/performed Overall Cognitive Status: Impaired/Different from baseline (per family "not at baseline.") Area of Impairment: Safety/judgement;Awareness;Problem solving;Memory        Memory: Decreased recall of precautions;Decreased short-term memory   Safety/Judgement: Decreased awareness of deficits;Decreased awareness of  safety Awareness:  Intellectual Problem Solving: Slow processing;Decreased initiation;Difficulty sequencing;Requires tactile cues;Requires verbal cues General Comments: Pt si alert, cooperative and motivated however poor safety awarenss and poor insight of deficits. Pt's vison continues to gereatly impact pt's abilities. R and L visual field deficits noted         General Comments General comments (skin integrity, edema, etc.): pt performed several dexterity exercises and balance exercises in stnding to promote return in function. Overall tolerated well but family will continue to progress and have pt perform when not in PT session      Pertinent Vitals/Pain Pain Assessment: No/denies pain     PT Goals (current goals can now be found in the care plan section) Acute Rehab PT Goals Patient Stated Goal: to go home Progress towards PT goals: Progressing toward goals    Frequency    7X/week      PT Plan Current plan remains appropriate    Co-evaluation     PT goals addressed during session: Mobility/safety with mobility;Balance;Proper use of DME;Strengthening/ROM        AM-PAC PT "6 Clicks" Mobility   Outcome Measure  Help needed turning from your back to your side while in a flat bed without using bedrails?: A Little Help needed moving from lying on your back to sitting on the side of a flat bed without using bedrails?: A Little Help needed moving to and from a bed to a chair (including a wheelchair)?: A Little Help needed standing up from a chair using your arms (e.g., wheelchair or bedside chair)?: A Little Help needed to walk in hospital room?: A Little Help needed climbing 3-5 steps with a railing? : A Little 6 Click Score: 18    End of Session   Activity Tolerance: Patient tolerated treatment well Patient left: in chair;with call bell/phone within reach;with bed alarm set Nurse Communication: Mobility status PT Visit Diagnosis: Hemiplegia and hemiparesis;Muscle  weakness (generalized) (M62.81) Hemiplegia - Right/Left: Right Hemiplegia - dominant/non-dominant: Dominant Hemiplegia - caused by: Cerebral infarction     Time: 7425-9563 PT Time Calculation (min) (ACUTE ONLY): 27 min  Charges:  $Gait Training: 8-22 mins $Neuromuscular Re-education: 8-22 mins                     Jetta Lout PTA 11/29/20, 5:47 PM

## 2020-11-29 NOTE — Progress Notes (Signed)
Inpatient Rehabilitation Admissions Coordinator   I spoke with daughter in law, Nann by phone. Family prefers CIR admit rather than SNF if she still qualifies once her airborne precautions are to be removed. I assume she is with a 10 day isolation. I will follow her progress with therapies next week and keep acute team updated . Has been hospitalized 3 days.I can not pursue insurance Auth until 48 hrs before possible admit.  Ottie Glazier, RN, MSN Rehab Admissions Coordinator [phone] 11/29/2020 2:15 PM

## 2020-11-29 NOTE — Progress Notes (Addendum)
SLP F/U Note  Patient Details Name: Madison Brown MRN: [id number] DOB: 09-26-1929   Cancelled treatment:       Reason Eval/Treat Not Completed:  (chart reviewed; consulted pt and Dtr in room). Met w/ pt in room, Dtrs present. Pt has improved over the past few days per NSG and Dtrs in room. She has been tolerating her mech soft diet and would like to upgrade to a regular diet in order to be able to have baked potatoes. Pt is assisted at meals by her Dtrs when needed. She verbally recalls general aspiration precautions and the need to follow them. Dtrs report pt is conversing w/ them in general conversation w/out deficits noted.  Recommend upgrade to a regular consistency diet w/ general aspiration precautions. MD to reconsult if any new needs arise while admitted.      Orinda Kenner, MS, CCC-SLP Speech Language Pathologist Rehab Services [phone] [facility] 11/29/2020, 1:27 PM

## 2020-11-30 DIAGNOSIS — R4781 Slurred speech: Secondary | ICD-10-CM | POA: Diagnosis not present

## 2020-11-30 LAB — CBC
HCT: 31.5 % — ABNORMAL LOW (ref 36.0–46.0)
Hemoglobin: 10.8 g/dL — ABNORMAL LOW (ref 12.0–15.0)
MCH: 30.7 pg (ref 26.0–34.0)
MCHC: 34.3 g/dL (ref 30.0–36.0)
MCV: 89.5 fL (ref 80.0–100.0)
Platelets: 166 10*3/uL (ref 150–400)
RBC: 3.52 MIL/uL — ABNORMAL LOW (ref 3.87–5.11)
RDW: 13.3 % (ref 11.5–15.5)
WBC: 7.8 10*3/uL (ref 4.0–10.5)
nRBC: 0 % (ref 0.0–0.2)

## 2020-11-30 LAB — BASIC METABOLIC PANEL
Anion gap: 6 (ref 5–15)
BUN: 19 mg/dL (ref 8–23)
CO2: 28 mmol/L (ref 22–32)
Calcium: 8.4 mg/dL — ABNORMAL LOW (ref 8.9–10.3)
Chloride: 104 mmol/L (ref 98–111)
Creatinine, Ser: 1.06 mg/dL — ABNORMAL HIGH (ref 0.44–1.00)
GFR, Estimated: 50 mL/min — ABNORMAL LOW (ref >60)
Glucose, Bld: 116 mg/dL — ABNORMAL HIGH (ref 70–99)
Potassium: 3.6 mmol/L (ref 3.5–5.1)
Sodium: 138 mmol/L (ref 135–145)

## 2020-11-30 NOTE — Progress Notes (Signed)
Physical Therapy Treatment Patient Details Name: Madison Brown MRN: [id number] DOB: 1929/09/06 Today's Date: 11/30/2020   History of Present Illness 87 y.o. female who presents with slurred speech and balance difficulties. MRI brain revealed: acute infarction affecting the right posterior parietal lobe, subacute infarction of the left occipital lobe, old infarction of the right occipital lobe, and numerous old small vessel infarctions affecting both cerebellar hemispheres. Pt has past medical history of CAD, HTN, and Hyperlipidemia    PT Comments    Pt was sitting in recliner upon arriving. Pleasantly confused and agreeable to session. Oriented to self and hospital however not to full scope of situation. Pt gets tearful when talking about missing her spouse and wanting to go home. Redirected pt to focus on desired task. This PT session focused on improving balance and safety. Pt does well with BUE support however vision deficits and overall poor safety awareness continue to make her a high fall risk. Session progressed to dynamic balance exercises without use of AD. Pt is extremely unsteady without UE support. Scissoring and narrow BOS throughout even with vcs to correct. Several occasions of LOB with intervention to prevent falling. Poor carry-over between activity however pt remains motivated. HR does reach 130s but quickly resolves to 90s with seated rest. PT recommends DC to CIR to address balance deficits while improving overall safety awareness with ADLs.    Recommendations for follow up therapy are one component of a multi-disciplinary discharge planning process, led by the attending physician.  Recommendations may be updated based on patient status, additional functional criteria and insurance authorization.  Follow Up Recommendations  CIR;Other (comment);Supervision - Intermittent (For balance deficits and fall prevention)     Equipment Recommendations  Other (comment) (defer to next  level of care)       Precautions / Restrictions Precautions Precautions: Fall Precaution Comments: high fall risk Restrictions Weight Bearing Restrictions: No     Mobility  Bed Mobility    General bed mobility comments: pt is in recliner pre/post session    Transfers Overall transfer level: Needs assistance Equipment used: None Transfers: Sit to/from Stand Sit to Stand: Min assist Stand pivot transfers: Min assist (to toilet)       General transfer comment: Pt requires CGA/supervision with use of RW but does require min assist without AD or UE support  Ambulation/Gait Ambulation/Gait assistance: Min assist Gait Distance (Feet): 50 Feet Assistive device: None Gait Pattern/deviations: Narrow base of support;Scissoring;Staggering left;Staggering right;Drifts right/left Gait velocity: minimally decreased   General Gait Details: session focused on dynamic balance exercises. Pt is a severe fall risk without use of RW. narrow BOS throughout even with vcs. Pt performed marching, backwards gait, side stepping, turning, and stop-go drills. Alot of intervention required to prevent fall. Pt does however ambulate with CGA/supervision with use of RW without LOB. Pt will continue to focus of improving balance and safety when pt is up moving around.      Balance Overall balance assessment: Needs assistance Sitting-balance support: Feet supported Sitting balance-Leahy Scale: Good     Standing balance support: No upper extremity supported;During functional activity Standing balance-Leahy Scale: Poor Standing balance comment: extremely poor balance without UE support.       Cognition Arousal/Alertness: Awake/alert Behavior During Therapy: WFL for tasks assessed/performed;Impulsive (slightly impulsive at times but overall WFL) Overall Cognitive Status: Impaired/Different from baseline (oriented to self and hospital however disoriented to situation + poor overall safety awarenss) Area  of Impairment: Memory;Safety/judgement;Awareness;Orientation      Orientation Level:  Person;Place   Memory: Decreased recall of precautions;Decreased short-term memory   Safety/Judgement: Decreased awareness of safety;Decreased awareness of deficits Awareness: Intellectual Problem Solving: Decreased initiation;Difficulty sequencing;Requires verbal cues General Comments: Pt is A and O x 2             Pertinent Vitals/Pain Pain Assessment: No/denies pain     PT Goals (current goals can now be found in the care plan section) Acute Rehab PT Goals Patient Stated Goal: to go home Progress towards PT goals: Progressing toward goals    Frequency    7X/week      PT Plan Current plan remains appropriate    Co-evaluation     PT goals addressed during session: Mobility/safety with mobility;Balance        AM-PAC PT "6 Clicks" Mobility   Outcome Measure  Help needed turning from your back to your side while in a flat bed without using bedrails?: A Little Help needed moving from lying on your back to sitting on the side of a flat bed without using bedrails?: A Little Help needed moving to and from a bed to a chair (including a wheelchair)?: A Little Help needed standing up from a chair using your arms (e.g., wheelchair or bedside chair)?: A Little Help needed to walk in hospital room?: A Little Help needed climbing 3-5 steps with a railing? : A Little 6 Click Score: 18    End of Session Equipment Utilized During Treatment: Gait belt Activity Tolerance: Patient tolerated treatment well Patient left: in chair;with call bell/phone within reach;with bed alarm set Nurse Communication: Mobility status PT Visit Diagnosis: Hemiplegia and hemiparesis;Muscle weakness (generalized) (M62.81) Hemiplegia - Right/Left: Right Hemiplegia - dominant/non-dominant: Dominant Hemiplegia - caused by: Cerebral infarction     Time: 6314-9702 PT Time Calculation (min) (ACUTE ONLY): 24  min  Charges:  $Gait Training: 8-22 mins $Neuromuscular Re-education: 8-22 mins                     Jetta Lout PTA 11/30/20, 4:33 PM

## 2020-11-30 NOTE — Progress Notes (Signed)
Occupational Therapy Treatment Patient Details Name: Madison Brown MRN: [id number] DOB: 1929/05/17 Today's Date: 11/30/2020   History of present illness 87 y.o. female who presents with slurred speech and balance difficulties. MRI brain revealed: acute infarction affecting the right posterior parietal lobe, subacute infarction of the left occipital lobe, old infarction of the right occipital lobe, and numerous old small vessel infarctions affecting both cerebellar hemispheres. Pt has past medical history of CAD, HTN, and Hyperlipidemia   OT comments  Chart reviewed, pt greeted in room with family present, agreeable to OT tx session. Pt is oriented to self and place, not oriented to time or situation on this date. Tx session targeted improving cognition and safe, functional performance in ADL tasks and facilitating improved BUE FMC/strength. CGA-MIN A required for ADL tasks with improved R hand functional use noted. Pt required step by step vcs for appropriate RW use and safety demonstrating fair-poor carry over from previous tx sessions. Fair carry over noted within session. Education provided re: compensatory visual techniques with minimal carry over noted during tx session. Pt continues to be oriented only to self and place throughout tx session. Pt is left as received, NAD, all needs met. OT will continue to follow while admitted.    Recommendations for follow up therapy are one component of a multi-disciplinary discharge planning process, led by the attending physician.  Recommendations may be updated based on patient status, additional functional criteria and insurance authorization.    Follow Up Recommendations  CIR    Equipment Recommendations  3 in 1 bedside commode;Tub/shower seat    Recommendations for Other Services      Precautions / Restrictions Precautions Precautions: Fall Restrictions Weight Bearing Restrictions: No       Mobility Bed Mobility                General bed mobility comments: pt in recline before and after tx session    Transfers Overall transfer level: Needs assistance Equipment used: Rolling walker (2 wheeled) Transfers: Sit to/from [facility] Sit to Stand: Min guard Stand pivot transfers: Min assist (to toilet)            Balance Overall balance assessment: Needs assistance Sitting-balance support: Feet supported Sitting balance-Leahy Scale: Good     Standing balance support: Bilateral upper extremity supported Standing balance-Leahy Scale: Fair                             ADL either performed or assessed with clinical judgement   ADL                                         General ADL Comments: CGA for functional mobility to restroom, CGA for groming in standing at sink, close sup for peri care; MIN A for UB bathing; vcs required throughout for sequencing and safety     Vision   Additional Comments: pt with peripheral vision loss   Perception     Praxis      Cognition Arousal/Alertness: Awake/alert Behavior During Therapy: WFL for tasks assessed/performed Overall Cognitive Status: Impaired/Different from baseline Area of Impairment: Memory;Safety/judgement;Awareness;Orientation                 Orientation Level: Person;Place   Memory: Decreased recall of precautions;Decreased short-term memory   Safety/Judgement: Decreased awareness of deficits;Decreased awareness of safety Awareness: Intellectual  Problem Solving: Decreased initiation;Difficulty sequencing;Requires verbal cues General Comments: Pt with decreased awarenss compared to previous tx session; Pt is oriented to self and place, not oriented to situation or date. Step by step vcs required for safe walker use.                   Pertinent Vitals/ Pain       Pain Assessment: No/denies pain  Home Living                                              Frequency   Min 2X/week        Progress Toward Goals  OT Goals(current goals can now be found in the care plan section)  Progress towards OT goals: Progressing toward goals  Acute Rehab OT Goals Patient Stated Goal: to go home  Plan Discharge plan remains appropriate    Co-evaluation                 AM-PAC OT "6 Clicks" Daily Activity     Outcome Measure   Help from another person eating meals?: None Help from another person taking care of personal grooming?: A Little Help from another person toileting, which includes using toliet, bedpan, or urinal?: A Little Help from another person bathing (including washing, rinsing, drying)?: A Little Help from another person to put on and taking off regular upper body clothing?: A Little Help from another person to put on and taking off regular lower body clothing?: A Little 6 Click Score: 19    End of Session Equipment Utilized During Treatment: Gait belt;Rolling walker  OT Visit Diagnosis: Unsteadiness on feet (R26.81);History of falling (Z91.81)   Activity Tolerance Patient tolerated treatment well   Patient Left in chair;with call bell/phone within reach;with chair alarm set;with family/visitor present   Nurse Communication Mobility status        Time: 7409-9278 OT Time Calculation (min): 24 min  Charges: OT General Charges $OT Visit: 1 Visit OT Treatments $Self Care/Home Management : 23-37 mins  Shanon Payor, OTD OTR/L  11/30/20, 1:39 PM

## 2020-11-30 NOTE — Progress Notes (Signed)
PROGRESS NOTE    Madison Brown   STM:[id number]  DOB: 09-04-1929  PCP: Enid Baas, MD    DOA: 11/25/2020 LOS: 5    Brief Narrative / Hospital Course to Date:   Madison Brown is a 87 y.o. female seen in ed with complaints of Slurred speech and off balance  today morning. Last night pt went to bed at baseline and about 4 am she was not herself and had slurred speech and off balance. Pt reported to be leaning to the right side. Both daughter at bedside. Pt lives with spouse and at about 4 am when she got up to use restroom she was weak on right side and  knocked things down  by leaning on them  due to lack of balance.   Patient was found to have a subacute left occipital lobe stroke, an old right occipital and acute right posterior parietal stroke.  Neurology consulted.  Patient incidentally tested positive for Covid-19, without any complaints of respiratory symptoms.   Started on antibiotic for Klebsiella UTI.  Assessment & Plan   Principal Problem:   Slurred speech Active Problems:   CAD (coronary artery disease)   Essential hypertension, benign   Acute CVA (cerebrovascular accident) (HCC)   COVID-19 virus infection   Hx of CABG   S/P aortic valve replacement with bioprosthetic valve   Dementia (HCC)   Subacute CVA  Symptoms began early AM of 10/17.  MRI shows acute right posterior parietal lobe and subacute left occipital strokes.  CTA head and neck without large vessel occlusions. Passed swallow evaluation.  No events on tele.  --Neurology consulted -- Echo findings below --Telemetry --PT/OT - PT recommends CIR for rehab at discharge --Plavix loaded 300 mg, continue daily Plavix 75 mg --Full dose ASA given, continue ASA 81 mg daily --Continue high-intensity statin: Lipitor 80 mg daily  ECHO 11/26/2020  1. Left ventricular ejection fraction, by estimation, is 60 to 65%. The left ventricle has normal function. Left ventricular endocardial border not  optimally defined to evaluate regional wall motion. There is moderate left ventricular hypertrophy. Left  ventricular diastolic parameters are indeterminate.   2. Right ventricular systolic function is normal. The right ventricular size is normal.   3. The mitral valve is normal in structure. No evidence of mitral valve regurgitation. There is borderline mild mitral stenosis. The mean mitral valve gradient is 4.0 mmHg.   4. The aortic valve was not well visualized. Aortic valve regurgitation is not visualized. Mild aortic valve sclerosis is present, with no  evidence of aortic valve stenosis.    Hypokalemia -replacing potassium for K2.9 today (10/19).  Monitor BMP and replace as needed.  Covid-19 Infection - Incidental positive screen on admission.  Patient asymptomatic. - holding on paxlovid given need to continue atorvastatin   Elevated Hbg A1c / Prediabetes - Recent Hbg A1c 6.6%.  Repeat A1c this admission is 6.0%. Recommend monitoring as outpatient.   Hypertension - BP mildly elevated. - hold home amlodipine, relative permissive htn for now   History aortic stenosis s/p bioprosthetic aortic valve replacement - appears compensated Echo findings as above. Monitor.   CAD s/p 2-vessel CABG - stable, no active chest pain. Continue statin, aspirin   Dementia without behavioral disturbances.  Delirium precautions.   Underweight: Body mass index is 15.94 kg/m.   DVT prophylaxis: enoxaparin (LOVENOX) injection 30 mg Start: 11/27/20 2200 SCD's Start: 11/25/20 2106   Diet:  Diet Orders (From admission, onward)     Start  Ordered   11/29/20 1327  Diet regular Room service appropriate? Yes with Assist; Fluid consistency: Thin  Diet effective now       Question Answer Comment  Room service appropriate? Yes with Assist   Fluid consistency: Thin      11/29/20 1327              Code Status: Full Code   Subjective 11/30/20    Patient in bathroom with occupational  therapist when seen on rounds today she ambulates with walker back to the recliner chair.  Reports she feels well has no acute complaints.  Denies any fevers chills cough or shortness of breath.  No other acute complaints.  Wants to go home.   Disposition Plan & Communication   Status is: Inpatient  Remains inpatient appropriate because: Requires CIR for rehab once off COVID isolation precautions    Family Communication: Lucila Maine and daughter at bedside on rounds 10/22   Consults, Procedures, Significant Events   Consultants:  Neurology  Procedures:  Echo 11/27/2020, findings above  Antimicrobials:  Anti-infectives (From admission, onward)    Start     Dose/Rate Route Frequency Ordered Stop   11/27/20 1000  ceFAZolin (ANCEF) IVPB 1 g/50 mL premix        1 g 100 mL/hr over 30 Minutes Intravenous Every 12 hours 11/27/20 0916 11/29/20 2351   11/26/20 1800  cefTRIAXone (ROCEPHIN) 1 g in sodium chloride 0.9 % 100 mL IVPB  Status:  Discontinued        1 g 200 mL/hr over 30 Minutes Intravenous Every 24 hours 11/25/20 2108 11/27/20 0916   11/25/20 1815  cefTRIAXone (ROCEPHIN) 1 g in sodium chloride 0.9 % 100 mL IVPB        1 g 200 mL/hr over 30 Minutes Intravenous  Once 11/25/20 1807 11/25/20 1958         Micro    Objective   Vitals:   11/29/20 1614 11/29/20 2103 11/30/20 0606 11/30/20 0818  BP: 133/65 (!) 149/66 133/62 133/68  Pulse: 76 77 78 87  Resp: 16 18 16 16   Temp: 98.5 F (36.9 C) 97.7 F (36.5 C) 98.8 F (37.1 C) 98.4 F (36.9 C)  TempSrc: Oral Oral Oral Oral  SpO2: 98% 99% 97% 97%  Weight:      Height:        Intake/Output Summary (Last 24 hours) at 11/30/2020 1448 Last data filed at 11/30/2020 0510 Gross per 24 hour  Intake 42.66 ml  Output --  Net 42.66 ml   Filed Weights   11/26/20 0201  Weight: 40.8 kg    Physical Exam:  General exam: awake, alert, no acute distress, frail and underweight Respiratory system: Lungs clear bilaterally, no  wheezes rales or rhonchi, normal respiratory effort, on room air Cardiovascular system: Regular rate and rhythm, S1 and S2 present, no peripheral edema Central nervous system: no gross focal neurologic deficits, normal speech Psychiatry: normal mood, congruent affect, abnormal judgment and insight due to dementia  Labs   Data Reviewed: I have personally reviewed following labs and imaging studies  CBC: Recent Labs  Lab 11/25/20 1056 11/27/20 0358 11/28/20 0437 11/29/20 0421 11/30/20 0507  WBC 10.8* 12.2* 10.2 8.4 7.8  NEUTROABS 8.5*  --   --   --   --   HGB 12.3 10.9* 10.5* 10.3* 10.8*  HCT 37.1 33.4* 31.9* 29.9* 31.5*  MCV 89.2 88.6 89.9 89.5 89.5  PLT 173 153 156 155 166   Basic Metabolic Panel: Recent  Labs  Lab 11/25/20 1056 11/27/20 0358 11/28/20 0437 11/29/20 0421 11/30/20 0507  NA 135 135 137 136 138  K 3.7 2.9* 3.8 3.4* 3.6  CL 97* 100 105 102 104  CO2 27 29 27 28 28   GLUCOSE 106* 123* 107* 102* 116*  BUN 12 15 21 17 19   CREATININE 1.24* 1.24* 1.14* 1.02* 1.06*  CALCIUM 9.1 8.2* 8.6* 8.3* 8.4*  MG  --   --  2.1  --   --    GFR: Estimated Creatinine Clearance: 23.2 mL/min (A) (by C-G formula based on SCr of 1.06 mg/dL (H)). Liver Function Tests: Recent Labs  Lab 11/25/20 1056  AST 15  ALT 11  ALKPHOS 88  BILITOT 0.6  PROT 8.0  ALBUMIN 3.9   No results for input(s): LIPASE, AMYLASE in the last 168 hours. No results for input(s): AMMONIA in the last 168 hours. Coagulation Profile: Recent Labs  Lab 11/25/20 1056  INR 1.1   Cardiac Enzymes: No results for input(s): CKTOTAL, CKMB, CKMBINDEX, TROPONINI in the last 168 hours. BNP (last 3 results) No results for input(s): PROBNP in the last 8760 hours. HbA1C: No results for input(s): HGBA1C in the last 72 hours.  CBG: No results for input(s): GLUCAP in the last 168 hours. Lipid Profile: No results for input(s): CHOL, HDL, LDLCALC, TRIG, CHOLHDL, LDLDIRECT in the last 72 hours.  Thyroid Function  Tests: No results for input(s): TSH, T4TOTAL, FREET4, T3FREE, THYROIDAB in the last 72 hours. Anemia Panel: No results for input(s): VITAMINB12, FOLATE, FERRITIN, TIBC, IRON, RETICCTPCT in the last 72 hours. Sepsis Labs: No results for input(s): PROCALCITON, LATICACIDVEN in the last 168 hours.  Recent Results (from the past 240 hour(s))  Resp Panel by RT-PCR (Flu A&B, Covid) Urine, Clean Catch     Status: Abnormal   Collection Time: 11/25/20  5:29 PM   Specimen: Urine, Clean Catch; Nasopharyngeal(NP) swabs in vial transport medium  Result Value Ref Range Status   SARS Coronavirus 2 by RT PCR POSITIVE (A) NEGATIVE Final    Comment: RESULT CALLED TO, READ BACK BY AND VERIFIED WITH: MATT BASSETT @1952  ON 11/25/20 SKL (NOTE) SARS-CoV-2 target nucleic acids are DETECTED.  The SARS-CoV-2 RNA is generally detectable in upper respiratory specimens during the acute phase of infection. Positive results are indicative of the presence of the identified virus, but do not rule out bacterial infection or co-infection with other pathogens not detected by the test. Clinical correlation with patient history and other diagnostic information is necessary to determine patient infection status. The expected result is Negative.  Fact Sheet for Patients: 11/27/20  Fact Sheet for Healthcare Providers:  This test is not yet approved or cleared by the 11/27/20 FDA and  has been authorized for detection and/or diagnosis of SARS-CoV-2 by FDA under an Emergency Use Authorization (EUA).  This EUA will remain in effect (meaning this test can b e used) for the duration of  the COVID-19 declaration under Section 564(b)(1) of the Act, 21 U.S.C. section 360bbb-3(b)(1), unless the authorization is terminated or revoked sooner.     Influenza A by PCR NEGATIVE NEGATIVE Final   Influenza B by PCR NEGATIVE NEGATIVE Final    Comment:  (NOTE) The Xpert Xpress SARS-CoV-2/FLU/RSV plus assay is intended as an aid in the diagnosis of influenza from Nasopharyngeal swab specimens and should not be used as a sole basis for treatment. Nasal washings and aspirates are unacceptable for Xpert Xpress SARS-CoV-2/FLU/RSV testing.  Fact Sheet for Patients: BloggerCourse.com  Fact Sheet for Healthcare Providers: SeriousBroker.it  This test is not yet approved or cleared by the [place] FDA and has been authorized for detection and/or diagnosis of SARS-CoV-2 by FDA under an Emergency Use Authorization (EUA). This EUA will remain in effect (meaning this test can be used) for the duration of the COVID-19 declaration under Section 564(b)(1) of the Act, 21 U.S.C. section 360bbb-3(b)(1), unless the authorization is terminated or revoked.  Performed at [facility], [address] [place], [place] [address]   Urine Culture     Status: Abnormal   Collection Time: 11/25/20  5:29 PM   Specimen: Urine, Random  Result Value Ref Range Status   Specimen Description   Final    URINE, RANDOM Performed at [facility], [address] [place], [place] [address]    Special Requests   Final    NONE Performed at [facility], [address] [place], [place] [address]    Culture >=100,000 COLONIES/mL KLEBSIELLA PNEUMONIAE (A)  Final   Report Status 11/27/2020 FINAL  Final   Organism ID, Bacteria KLEBSIELLA PNEUMONIAE (A)  Final      Susceptibility   Klebsiella pneumoniae - MIC*    AMPICILLIN RESISTANT Resistant     CEFAZOLIN <=4 SENSITIVE Sensitive     CEFEPIME <=0.12 SENSITIVE Sensitive     CEFTRIAXONE <=0.25 SENSITIVE Sensitive     CIPROFLOXACIN <=0.25 SENSITIVE Sensitive     GENTAMICIN <=1 SENSITIVE Sensitive     IMIPENEM <=0.25 SENSITIVE Sensitive     NITROFURANTOIN <=16 SENSITIVE Sensitive     TRIMETH/SULFA <=20 SENSITIVE Sensitive      AMPICILLIN/SULBACTAM <=2 SENSITIVE Sensitive     PIP/TAZO <=4 SENSITIVE Sensitive     * >=100,000 COLONIES/mL KLEBSIELLA PNEUMONIAE  Blood culture (routine x 2)     Status: None (Preliminary result)   Collection Time: 11/25/20  7:00 PM   Specimen: BLOOD  Result Value Ref Range Status   Specimen Description BLOOD BLOOD RIGHT FOREARM  Final   Special Requests   Final    BOTTLES DRAWN AEROBIC AND ANAEROBIC Blood Culture results may not be optimal due to an inadequate volume of blood received in culture bottles   Culture   Final    NO GROWTH 4 DAYS Performed at [facility], [address] [place], [place] [address]    Report Status PENDING  Incomplete  Culture, blood (Routine X 2) w Reflex to ID Panel     Status: None (Preliminary result)   Collection Time: 11/26/20 12:06 AM   Specimen: BLOOD  Result Value Ref Range Status   Specimen Description BLOOD LEFT ASSIST CONTROL  Final   Special Requests   Final    BOTTLES DRAWN AEROBIC ONLY Blood Culture adequate volume   Culture   Final    NO GROWTH 3 DAYS Performed at [facility], [address] [place], [place] [address]    Report Status PENDING  Incomplete      Imaging Studies   No results found.   Medications   Scheduled Meds:  amLODipine  10 mg Oral Daily   aspirin  81 mg Oral Daily   atorvastatin  40 mg Oral Daily   clopidogrel  75 mg Oral Daily   enoxaparin (LOVENOX) injection  30 mg Subcutaneous Daily   feeding supplement  237 mL Oral BID BM   Continuous Infusions:  sodium chloride Stopped (11/30/20 0056)       LOS: 5 days    Time spent:  20 minutes    Pennie Banter, DO [facility]  11/30/2020, 2:48 PM      If 7PM-7AM, please contact night-coverage. How to contact the [facility] Attending or Consulting provider 7A - 7P or covering provider during after hours 7P -7A, for this patient?    Check the care team in [facility] and look for a) attending/consulting TR[facility] provider listed and  b) the [facility] team listed Log into [email or web address] and use [place]'s universal password to access. If you do not have the password, please contact the hospital operator. Locate the [facility] provider you are looking for under [facility] and page to a number that you can be directly reached. If you still have difficulty reaching the provider, please page the [facility] (Director on Call) for the Hospitalists listed on amion for assistance.

## 2020-12-01 DIAGNOSIS — R4781 Slurred speech: Secondary | ICD-10-CM | POA: Diagnosis not present

## 2020-12-01 LAB — BASIC METABOLIC PANEL
Anion gap: 3 — ABNORMAL LOW (ref 5–15)
BUN: 19 mg/dL (ref 8–23)
CO2: 32 mmol/L (ref 22–32)
Calcium: 8.4 mg/dL — ABNORMAL LOW (ref 8.9–10.3)
Chloride: 102 mmol/L (ref 98–111)
Creatinine, Ser: 1.08 mg/dL — ABNORMAL HIGH (ref 0.44–1.00)
GFR, Estimated: 49 mL/min — ABNORMAL LOW (ref >60)
Glucose, Bld: 109 mg/dL — ABNORMAL HIGH (ref 70–99)
Potassium: 3.6 mmol/L (ref 3.5–5.1)
Sodium: 137 mmol/L (ref 135–145)

## 2020-12-01 LAB — CBC
HCT: 31 % — ABNORMAL LOW (ref 36.0–46.0)
Hemoglobin: 10.3 g/dL — ABNORMAL LOW (ref 12.0–15.0)
MCH: 30.1 pg (ref 26.0–34.0)
MCHC: 33.2 g/dL (ref 30.0–36.0)
MCV: 90.6 fL (ref 80.0–100.0)
Platelets: 178 10*3/uL (ref 150–400)
RBC: 3.42 MIL/uL — ABNORMAL LOW (ref 3.87–5.11)
RDW: 13.3 % (ref 11.5–15.5)
WBC: 7.8 10*3/uL (ref 4.0–10.5)
nRBC: 0 % (ref 0.0–0.2)

## 2020-12-01 LAB — CULTURE, BLOOD (ROUTINE X 2)
Culture: NO GROWTH
Special Requests: ADEQUATE

## 2020-12-01 NOTE — Progress Notes (Signed)
Physical Therapy Treatment Patient Details Name: Madison Brown MRN: [id number] DOB: [birth year] Today's Date: 12/01/2020   History of Present Illness 87 y.o. female who presents with slurred speech and balance difficulties. MRI brain revealed: acute infarction affecting the right posterior parietal lobe, subacute infarction of the left occipital lobe, old infarction of the right occipital lobe, and numerous old small vessel infarctions affecting both cerebellar hemispheres. Pt has past medical history of CAD, HTN, and Hyperlipidemia    PT Comments    Pt up in chair with daughter assist prior to arrival.  She is able to stand and walk 3 laps in room with RW and min guard.  Droplet mask is obtained for gait in hallway. She is able to walk 200' with RW and min guard with no LOB or buckling.  She does need some cues to step into walker and to avoid objects but overall does quite well.  Reports minimal fatigue.  She is able to stand 10x from chair with varied level of arm assist but does use back of LE's pt assist with standing against chair.  She is able to participate in standing ex x10  without UE support for marches, SLR and heel raises with min a occasionally for imbalances.  She is able to walk x 1 lap in room without AD today but has short shuffling cautious steps without RW but no LOB.  Discussed discharge plan with daughter.  Pt is making excellent progress and given mobility, it is unlikely that CIR will be appropriate upon discharge.  Discharge recommendations changed to reflect progress.  HHPT /OT with +1 assist for mobility is recommended at this time.  Pt still has some balance deficits and visual and processing deficits but does not seem to be a the level for CIR intervention at this time.    Recommendations for follow up therapy are one component of a multi-disciplinary discharge planning process, led by the attending physician.  Recommendations may be updated based on patient status,  additional functional criteria and insurance authorization.  Follow Up Recommendations  Home health PT;Supervision for mobility/OOB     Equipment Recommendations  Rolling walker with 5" wheels;3in1 (PT)    Recommendations for Other Services       Precautions / Restrictions Precautions Precautions: Fall Restrictions Weight Bearing Restrictions: No     Mobility  Bed Mobility               General bed mobility comments: oob prior with daugther.  remained in chair after.    Transfers Overall transfer level: Needs assistance Equipment used: None;Rolling walker (2 wheeled)   Sit to Stand: Min assist Stand pivot transfers: Min guard          Ambulation/Gait Ambulation/Gait assistance: Min guard Gait Distance (Feet): 200 Feet Assistive device: Rolling walker (2 wheeled);None Gait Pattern/deviations: Step-through pattern Gait velocity: minimally decreased   General Gait Details: 100 in room then progressed to 200' in hallway with RW and min gaurd.  cues to step into walker but no LOB or buckling noted.  she is able to walk 30' in room with no AD today but short shuffling steps   Stairs             Wheelchair Mobility    Modified Rankin (Stroke Patients Only)       Balance Overall balance assessment: Needs assistance Sitting-balance support: Feet supported Sitting balance-Leahy Scale: Good     Standing balance support: Bilateral upper extremity supported Standing balance-Leahy Scale: Fair Standing  balance comment: pt with improving balance without UE support today                            Cognition Arousal/Alertness: Awake/alert Behavior During Therapy: WFL for tasks assessed/performed;Impulsive Overall Cognitive Status: Impaired/Different from baseline                                 General Comments: some decreased processing time      Exercises Other Exercises Other Exercises: 10 x sit to stand - need cues to  use hands but demonstrates BLE strength and ability to do without hands Other Exercises: standing marches, SLR and heel raaises wihtout UE support and min gurard/assist    General Comments        Pertinent Vitals/Pain Pain Assessment: No/denies pain    Home Living                      Prior Function            PT Goals (current goals can now be found in the care plan section) Progress towards PT goals: Progressing toward goals    Frequency    7X/week      PT Plan Discharge plan needs to be updated    Co-evaluation              AM-PAC PT "6 Clicks" Mobility   Outcome Measure  Help needed turning from your back to your side while in a flat bed without using bedrails?: None Help needed moving from lying on your back to sitting on the side of a flat bed without using bedrails?: A Little Help needed moving to and from a bed to a chair (including a wheelchair)?: A Little Help needed standing up from a chair using your arms (e.g., wheelchair or bedside chair)?: None Help needed to walk in hospital room?: A Little Help needed climbing 3-5 steps with a railing? : A Little 6 Click Score: 20    End of Session Equipment Utilized During Treatment: Gait belt Activity Tolerance: Patient tolerated treatment well Patient left: in chair;with call bell/phone within reach;with bed alarm set;with family/visitor present Nurse Communication: Mobility status PT Visit Diagnosis: Hemiplegia and hemiparesis;Muscle weakness (generalized) (M62.81) Hemiplegia - Right/Left: Right Hemiplegia - dominant/non-dominant: Dominant Hemiplegia - caused by: Cerebral infarction     Time: 6967-8938 PT Time Calculation (min) (ACUTE ONLY): 30 min  Charges:  $Gait Training: 8-22 mins $Therapeutic Exercise: 8-22 mins                    Danielle Dess, PTA 12/01/20, 10:38 AM

## 2020-12-01 NOTE — Progress Notes (Signed)
PROGRESS NOTE    Madison Brown   OAC:[id number]  DOB: 28-Apr-1929  PCP: Enid Baas, MD    DOA: 11/25/2020 LOS: 6    Brief Narrative / Hospital Course to Date:   Madison Brown is a 87 y.o. female seen in ed with complaints of Slurred speech and off balance  today morning. Last night pt went to bed at baseline and about 4 am she was not herself and had slurred speech and off balance. Pt reported to be leaning to the right side. Both daughter at bedside. Pt lives with spouse and at about 4 am when she got up to use restroom she was weak on right side and  knocked things down  by leaning on them  due to lack of balance.   Patient was found to have a subacute left occipital lobe stroke, an old right occipital and acute right posterior parietal stroke.  Neurology consulted.  Patient incidentally tested positive for Covid-19, without any complaints of respiratory symptoms.   Started on antibiotic for Klebsiella UTI.  Assessment & Plan   Principal Problem:   Slurred speech Active Problems:   CAD (coronary artery disease)   Essential hypertension, benign   Acute CVA (cerebrovascular accident) (HCC)   COVID-19 virus infection   Hx of CABG   S/P aortic valve replacement with bioprosthetic valve   Dementia (HCC)   Subacute CVA  Symptoms began early AM of 10/17.  MRI shows acute right posterior parietal lobe and subacute left occipital strokes.  CTA head and neck without large vessel occlusions. Passed swallow evaluation.  No events on tele.  --Neurology consulted -- Echo findings below --Telemetry --PT/OT - PT recommends CIR for rehab at discharge --Plavix loaded 300 mg, continue daily Plavix 75 mg --Full dose ASA given, continue ASA 81 mg daily --Continue high-intensity statin: Lipitor 80 mg daily  ECHO 11/26/2020  1. Left ventricular ejection fraction, by estimation, is 60 to 65%. The left ventricle has normal function. Left ventricular endocardial border not  optimally defined to evaluate regional wall motion. There is moderate left ventricular hypertrophy. Left  ventricular diastolic parameters are indeterminate.   2. Right ventricular systolic function is normal. The right ventricular size is normal.   3. The mitral valve is normal in structure. No evidence of mitral valve regurgitation. There is borderline mild mitral stenosis. The mean mitral valve gradient is 4.0 mmHg.   4. The aortic valve was not well visualized. Aortic valve regurgitation is not visualized. Mild aortic valve sclerosis is present, with no  evidence of aortic valve stenosis.    Hypokalemia -replacing potassium for K2.9 today (10/19).  Monitor BMP and replace as needed.  Covid-19 Infection - Incidental positive screen on admission.  Patient asymptomatic. - holding on paxlovid given need to continue atorvastatin   Elevated Hbg A1c / Prediabetes - Recent Hbg A1c 6.6%.  Repeat A1c this admission is 6.0%. Recommend monitoring as outpatient.   Hypertension - BP mildly elevated. - hold home amlodipine, relative permissive htn for now   History aortic stenosis s/p bioprosthetic aortic valve replacement - appears compensated Echo findings as above. Monitor.   CAD s/p 2-vessel CABG - stable, no active chest pain. Continue statin, aspirin   Dementia without behavioral disturbances.  Delirium precautions.   Underweight: Body mass index is 15.94 kg/m.   DVT prophylaxis: enoxaparin (LOVENOX) injection 30 mg Start: 11/27/20 2200 SCD's Start: 11/25/20 2106   Diet:  Diet Orders (From admission, onward)     Start  Ordered   11/29/20 1327  Diet regular Room service appropriate? Yes with Assist; Fluid consistency: Thin  Diet effective now       Question Answer Comment  Room service appropriate? Yes with Assist   Fluid consistency: Thin      11/29/20 1327              Code Status: Full Code   Subjective 12/01/20    Patient sitting up in recliner when seen this  morning.  Daughter-in-law arrived at bedside during encounter.  Patient reports feeling very well and encouraged that PT feels she is doing better and may be able to go home with home health.  Daughter-in-law and patient and I discussed she would not have 24-hour help at home and therefore still a good idea to try to go to rehab.  Patient upset by this and says wants to go home with her husband.  Daughter-in-law reports that he is very hard of hearing and unable to provide adequate assistance.   Disposition Plan & Communication   Status is: Inpatient  Remains inpatient appropriate because: Requires CIR or SNF for rehab once off COVID isolation precautions.  24-hour supervision & assistance not available at home.    Family Communication: Daughter-in-law at bedside on rounds today 10/23   Consults, Procedures, Significant Events   Consultants:  Neurology  Procedures:  Echo 11/27/2020, findings above  Antimicrobials:  Anti-infectives (From admission, onward)    Start     Dose/Rate Route Frequency Ordered Stop   11/27/20 1000  ceFAZolin (ANCEF) IVPB 1 g/50 mL premix        1 g 100 mL/hr over 30 Minutes Intravenous Every 12 hours 11/27/20 0916 11/29/20 2351   11/26/20 1800  cefTRIAXone (ROCEPHIN) 1 g in sodium chloride 0.9 % 100 mL IVPB  Status:  Discontinued        1 g 200 mL/hr over 30 Minutes Intravenous Every 24 hours 11/25/20 2108 11/27/20 0916   11/25/20 1815  cefTRIAXone (ROCEPHIN) 1 g in sodium chloride 0.9 % 100 mL IVPB        1 g 200 mL/hr over 30 Minutes Intravenous  Once 11/25/20 1807 11/25/20 1958         Micro    Objective   Vitals:   12/01/20 0018 12/01/20 0515 12/01/20 0829 12/01/20 1145  BP: 126/75 129/61 (!) 141/71 100/61  Pulse: 81 76 86 83  Resp:  18  19  Temp: 98.5 F (36.9 C) 97.8 F (36.6 C) 97.8 F (36.6 C) 97.8 F (36.6 C)  TempSrc: Oral Oral Oral Oral  SpO2: 95% 99% 97% 99%  Weight:      Height:       No intake or output data in the 24  hours ending 12/01/20 1516  Filed Weights   11/26/20 0201  Weight: 40.8 kg    Physical Exam:  General exam: awake, alert, no acute distress, frail and underweight Respiratory system: Normal respiratory effort, on room air, lungs clear bilaterally Cardiovascular system: RRR, no peripheral edema Central nervous system: Grossly nonfocal exam, no speech Psychiatry: normal mood, congruent affect, abnormal judgment and insight due to dementia  Labs   Data Reviewed: I have personally reviewed following labs and imaging studies  CBC: Recent Labs  Lab 11/25/20 1056 11/27/20 0358 11/28/20 0437 11/29/20 0421 11/30/20 0507 12/01/20 0527  WBC 10.8* 12.2* 10.2 8.4 7.8 7.8  NEUTROABS 8.5*  --   --   --   --   --   HGB  12.3 10.9* 10.5* 10.3* 10.8* 10.3*  HCT 37.1 33.4* 31.9* 29.9* 31.5* 31.0*  MCV 89.2 88.6 89.9 89.5 89.5 90.6  PLT 173 153 156 155 166 178   Basic Metabolic Panel: Recent Labs  Lab 11/27/20 0358 11/28/20 0437 11/29/20 0421 11/30/20 0507 12/01/20 0527  NA 135 137 136 138 137  K 2.9* 3.8 3.4* 3.6 3.6  CL 100 105 102 104 102  CO2 29 27 28 28  32  GLUCOSE 123* 107* 102* 116* 109*  BUN 15 21 17 19 19   CREATININE 1.24* 1.14* 1.02* 1.06* 1.08*  CALCIUM 8.2* 8.6* 8.3* 8.4* 8.4*  MG  --  2.1  --   --   --    GFR: Estimated Creatinine Clearance: 22.7 mL/min (A) (by C-G formula based on SCr of 1.08 mg/dL (H)). Liver Function Tests: Recent Labs  Lab 11/25/20 1056  AST 15  ALT 11  ALKPHOS 88  BILITOT 0.6  PROT 8.0  ALBUMIN 3.9   No results for input(s): LIPASE, AMYLASE in the last 168 hours. No results for input(s): AMMONIA in the last 168 hours. Coagulation Profile: Recent Labs  Lab 11/25/20 1056  INR 1.1   Cardiac Enzymes: No results for input(s): CKTOTAL, CKMB, CKMBINDEX, TROPONINI in the last 168 hours. BNP (last 3 results) No results for input(s): PROBNP in the last 8760 hours. HbA1C: No results for input(s): HGBA1C in the last 72 hours.  CBG: No  results for input(s): GLUCAP in the last 168 hours. Lipid Profile: No results for input(s): CHOL, HDL, LDLCALC, TRIG, CHOLHDL, LDLDIRECT in the last 72 hours.  Thyroid Function Tests: No results for input(s): TSH, T4TOTAL, FREET4, T3FREE, THYROIDAB in the last 72 hours. Anemia Panel: No results for input(s): VITAMINB12, FOLATE, FERRITIN, TIBC, IRON, RETICCTPCT in the last 72 hours. Sepsis Labs: No results for input(s): PROCALCITON, LATICACIDVEN in the last 168 hours.  Recent Results (from the past 240 hour(s))  Resp Panel by RT-PCR (Flu A&B, Covid) Urine, Clean Catch     Status: Abnormal   Collection Time: 11/25/20  5:29 PM   Specimen: Urine, Clean Catch; Nasopharyngeal(NP) swabs in vial transport medium  Result Value Ref Range Status   SARS Coronavirus 2 by RT PCR POSITIVE (A) NEGATIVE Final    Comment: RESULT CALLED TO, READ BACK BY AND VERIFIED WITH: MATT BASSETT @1954  ON 11/25/20 SKL (NOTE) SARS-CoV-2 target nucleic acids are DETECTED.  The SARS-CoV-2 RNA is generally detectable in upper respiratory specimens during the acute phase of infection. Positive results are indicative of the presence of the identified virus, but do not rule out bacterial infection or co-infection with other pathogens not detected by the test. Clinical correlation with patient history and other diagnostic information is necessary to determine patient infection status. The expected result is Negative.  Fact Sheet for Patients: 11/27/20  Fact Sheet for Healthcare Providers:  This test is not yet approved or cleared by the 11/27/20 FDA and  has been authorized for detection and/or diagnosis of SARS-CoV-2 by FDA under an Emergency Use Authorization (EUA).  This EUA will remain in effect (meaning this test can b e used) for the duration of  the COVID-19 declaration under Section 564(b)(1) of the Act, 21 U.S.C. section  360bbb-3(b)(1), unless the authorization is terminated or revoked sooner.     Influenza A by PCR NEGATIVE NEGATIVE Final   Influenza B by PCR NEGATIVE NEGATIVE Final    Comment: (NOTE) The Xpert Xpress SARS-CoV-2/FLU/RSV plus assay is intended as an aid in the  diagnosis of influenza from Nasopharyngeal swab specimens and should not be used as a sole basis for treatment. Nasal washings and aspirates are unacceptable for Xpert Xpress SARS-CoV-2/FLU/RSV testing.  Fact Sheet for Patients: BloggerCourse.com  Fact Sheet for Healthcare Providers: SeriousBroker.it  This test is not yet approved or cleared by the [place] FDA and has been authorized for detection and/or diagnosis of SARS-CoV-2 by FDA under an Emergency Use Authorization (EUA). This EUA will remain in effect (meaning this test can be used) for the duration of the COVID-19 declaration under Section 564(b)(1) of the Act, 21 U.S.C. section 360bbb-3(b)(1), unless the authorization is terminated or revoked.  Performed at [facility], [address] [place], [place] [address]   Urine Culture     Status: Abnormal   Collection Time: 11/25/20  5:29 PM   Specimen: Urine, Random  Result Value Ref Range Status   Specimen Description   Final    URINE, RANDOM Performed at [facility], [address] [place], [place] [address]    Special Requests   Final    NONE Performed at [facility], [address] [place], [place] [address]    Culture >=100,000 COLONIES/mL KLEBSIELLA PNEUMONIAE (A)  Final   Report Status 11/27/2020 FINAL  Final   Organism ID, Bacteria KLEBSIELLA PNEUMONIAE (A)  Final      Susceptibility   Klebsiella pneumoniae - MIC*    AMPICILLIN RESISTANT Resistant     CEFAZOLIN <=4 SENSITIVE Sensitive     CEFEPIME <=0.12 SENSITIVE Sensitive     CEFTRIAXONE <=0.25 SENSITIVE Sensitive     CIPROFLOXACIN <=0.25 SENSITIVE Sensitive      GENTAMICIN <=1 SENSITIVE Sensitive     IMIPENEM <=0.25 SENSITIVE Sensitive     NITROFURANTOIN <=16 SENSITIVE Sensitive     TRIMETH/SULFA <=20 SENSITIVE Sensitive     AMPICILLIN/SULBACTAM <=2 SENSITIVE Sensitive     PIP/TAZO <=4 SENSITIVE Sensitive     * >=100,000 COLONIES/mL KLEBSIELLA PNEUMONIAE  Blood culture (routine x 2)     Status: None (Preliminary result)   Collection Time: 11/25/20  7:00 PM   Specimen: BLOOD  Result Value Ref Range Status   Specimen Description BLOOD BLOOD RIGHT FOREARM  Final   Special Requests   Final    BOTTLES DRAWN AEROBIC AND ANAEROBIC Blood Culture results may not be optimal due to an inadequate volume of blood received in culture bottles   Culture   Final    NO GROWTH 4 DAYS Performed at [facility], [address], [place], [place] [address]    Report Status PENDING  Incomplete  Culture, blood (Routine X 2) w Reflex to ID Panel     Status: None   Collection Time: 11/26/20 12:06 AM   Specimen: BLOOD  Result Value Ref Range Status   Specimen Description BLOOD LEFT ASSIST CONTROL  Final   Special Requests   Final    BOTTLES DRAWN AEROBIC ONLY Blood Culture adequate volume   Culture   Final    NO GROWTH 5 DAYS Performed at [facility], [address] [place], [place] [address]    Report Status 12/01/2020 FINAL  Final      Imaging Studies   No results found.   Medications   Scheduled Meds:  amLODipine  10 mg Oral Daily   aspirin  81 mg Oral Daily   atorvastatin  40 mg Oral Daily   clopidogrel  75 mg Oral Daily   enoxaparin (LOVENOX) injection  30 mg Subcutaneous  Daily   feeding supplement  237 mL Oral BID BM   Continuous Infusions:  sodium chloride Stopped (11/30/20 0056)       LOS: 6 days    Time spent: 25 minutes with > 50% spent at bedside and in coordination of care     Pennie Banter, DO [facility]  12/01/2020, 3:16 PM      If 7PM-7AM, please contact night-coverage. How to  contact the [facility] Attending or Consulting provider 7A - 7P or covering provider during after hours 7P -7A, for this patient?    Check the care team in [facility] and look for a) attending/consulting TRH provider listed and b) the [facility] team listed Log into [email or web address] and use [facility] universal password to access. If you do not have the password, please contact the hospital operator. Locate the [facility] provider you are looking for under [facility] and page to a number that you can be directly reached. If you still have difficulty reaching the provider, please page the [facility] (Director on Call) for the Hospitalists listed on amion for assistance.

## 2020-12-02 DIAGNOSIS — R4781 Slurred speech: Secondary | ICD-10-CM | POA: Diagnosis not present

## 2020-12-02 LAB — CULTURE, BLOOD (ROUTINE X 2): Culture: NO GROWTH

## 2020-12-02 NOTE — [place] FL2 (Signed)
[place] MEDICAID [id number] LEVEL OF CARE SCREENING TOOL     IDENTIFICATION  Patient Name: Madison Brown Birthdate: Mar 21, 1929 Sex: female Admission Date (Current Location): 11/25/2020  [facility] and [place] Number:  Chiropodist and Address:  [facility], [address] [place], [place] [address]      Provider Number: [id number]  Attending Physician Name and Address:  Pennie Banter, DO  Relative Name and Phone Number:  Milana Kidney (Daughter)   [phone] [facility] Phone),Spackman,nann Other     [phone] daughter in law    Current Level of Care: Hospital Recommended Level of Care: Skilled Nursing Facility Prior Approval Number:    Date Approved/Denied:   PASRR Number: [id number]  Discharge Plan: SNF    Current Diagnoses: Patient Active Problem List   Diagnosis Date Noted   Hx of CABG 11/26/2020   S/P aortic valve replacement with bioprosthetic valve 11/26/2020   Dementia (HCC) 11/26/2020   TIA (transient ischemic attack) 11/25/2020   Acute CVA (cerebrovascular accident) (HCC) 11/25/2020   COVID-19 virus infection 11/25/2020   Slurred speech 11/25/2020   Underweight 02/23/2017   Essential hypertension, benign 03/30/2013   Other and unspecified hyperlipidemia 03/30/2013   Encounter for preventive health examination 10/06/2012   CAD (coronary artery disease) 06/22/2011    Orientation RESPIRATION BLADDER Height & Weight     Self, Situation, Place (Disoriented to time; memory impairment)  Normal Continent Weight: 40.8 kg Height:  5\' 3"  (160 cm)  BEHAVIORAL SYMPTOMS/MOOD NEUROLOGICAL BOWEL NUTRITION STATUS      Continent Diet (Regular)  AMBULATORY STATUS COMMUNICATION OF NEEDS Skin   Limited Assist (+1 assist and cues) Verbally Bruising (Ecchymosis bilateral arms)                       Personal Care Assistance Level of Assistance  Bathing, Feeding, Dressing Bathing Assistance: Limited assistance (Min A) Feeding  assistance: Limited assistance (CGA) Dressing Assistance: Limited assistance (CGA)     Functional Limitations Info  Sight, Hearing, Speech Sight Info: Impaired Hearing Info: Impaired (Very Hard of Hearing) Speech Info: Impaired (slightly slurred)    SPECIAL CARE FACTORS FREQUENCY  PT (By licensed PT), OT (By licensed OT)     PT Frequency: Min 5x weekly OT Frequency: Min 5x weekly            Contractures Contractures Info: Not present    Additional Factors Info  Code Status, Allergies Code Status Info: FULL CODE Allergies Info: No Known Allergies           Current Medications (12/02/2020):  This is the current hospital active medication list Current Facility-Administered Medications  Medication Dose Route Frequency Provider Last Rate Last Admin   0.9 %  sodium chloride infusion   Intravenous PRN 12/04/2020 A, DO   Stopped at 11/30/20 0056   acetaminophen (TYLENOL) tablet 650 mg  650 mg Oral Q4H PRN 12/02/20, MD       Or   acetaminophen (TYLENOL) 160 MG/5ML solution 650 mg  650 mg Per Tube Q4H PRN Gertha Calkin, MD       Or   acetaminophen (TYLENOL) suppository 650 mg  650 mg Rectal Q4H PRN Gertha Calkin, MD       amLODipine (NORVASC) tablet 10 mg  10 mg Oral Daily Gertha Calkin A, DO   10 mg at 12/01/20 12/03/20   aspirin chewable tablet 81 mg  81 mg Oral Daily Wouk, 7106, MD  81 mg at 12/02/20 1041   atorvastatin (LIPITOR) tablet 40 mg  40 mg Oral Daily Kathrynn Running, MD   40 mg at 12/02/20 1041   clopidogrel (PLAVIX) tablet 75 mg  75 mg Oral Daily Kathrynn Running, MD   75 mg at 12/02/20 1041   enoxaparin (LOVENOX) injection 30 mg  30 mg Subcutaneous Daily Tressie Ellis, RPH   30 mg at 12/01/20 2149   feeding supplement (ENSURE ENLIVE / ENSURE PLUS) liquid 237 mL  237 mL Oral BID BM Esaw Grandchild A, DO   237 mL at 12/02/20 1437     Discharge Medications: Please see discharge summary for a list of discharge medications.  Relevant  Imaging Results:  Relevant Lab Results:   Additional Information SSN [id number]  Caryn Section, RN

## 2020-12-02 NOTE — TOC Progression Note (Signed)
Transition of Care [facility]) - Progression Note    Patient Details  Name: Madison Brown MRN: [id number] Date of Birth: 1929/12/29  Transition of Care [facility] CM/SW Contact  Caryn Section, RN Phone Number: 12/02/2020, 4:07 PM  Clinical Narrative:   Physical therapy recommendation was updated to reflect a need for Home Health.  CIR has signed off at this time.  When speaking to family, they would like TOC to pursue SNF placement, as they do not believe patient would be safe at home.  Reviewed with care team, PT recommendations stand.  Bed search started as per family request.  TOC contact information provided, TOC to follow to discharge.     Expected Discharge Plan: IP Rehab Facility (CIR recommended and will evaluate)    Expected Discharge Plan and Services Expected Discharge Plan: IP Rehab Facility (CIR recommended and will evaluate)     Post Acute Care Choice:  (TBD)                                         Social Determinants of Health (SDOH) Interventions    Readmission Risk Interventions No flowsheet data found.

## 2020-12-02 NOTE — Progress Notes (Signed)
Inpatient Rehabilitation Admissions Coordinator   Therapy recommendation has changed and patient no longer in need of CIR level therapy . I spoke with her daughter in law, Hilda Blades, by phone and she is requesting SNF rehab. I have alerted acute team and TOC. We will sign off at this time.  Ottie Glazier, RN, MSN Rehab Admissions Coordinator [phone] 12/02/2020 10:04 AM

## 2020-12-02 NOTE — Progress Notes (Signed)
Physical Therapy Treatment Patient Details Name: Madison Brown MRN: [id number] DOB: Jan 18, 1930 Today's Date: 12/02/2020   History of Present Illness 87 y.o. female who presents with slurred speech and balance difficulties. MRI brain revealed: acute infarction affecting the right posterior parietal lobe, subacute infarction of the left occipital lobe, old infarction of the right occipital lobe, and numerous old small vessel infarctions affecting both cerebellar hemispheres. Pt has past medical history of CAD, HTN, and Hyperlipidemia    PT Comments    Pt sitting EOB with daughter in law upon arrival.  She is able to stand and walk 400' + in hallway with RW and min guard with cues at times to avoid objects in hallway and for attentiveness to tasks.  She on occasion will run into objects on right but is able to correct with time.  She is able to up/down 4 steps with left rail to simulate home with min guard.  Standing ex in room with occasional LUE support on walker.  She walks 52' in room with no AD but short shuffling gait.  Gait is significantly improved with RW.    Discussed at length with daughter in law in regards to discharge plan.  She remains concerned about her discharging home and that she still requires +1 assist and cues.  She does not feel that the husband and family can provide this for her and wishes to have her discharged to SNF for further rehab.  She does continue to demonstrate some balance deficits along with visual field and inattentiveness to tasks which would be addressed in a rehab facility.  If insurance approves admission to SNF, family would like to go that route upon discharge.  Will discuss with OT as deficits shown are typically more of their focus and they can speak to that more during their session.    Recommendations for follow up therapy are one component of a multi-disciplinary discharge planning process, led by the attending physician.  Recommendations may be updated  based on patient status, additional functional criteria and insurance authorization.  Follow Up Recommendations  Home health PT, family firm in wanting SNF upon discharge if available.     Assistance Recommended at Discharge Frequent or constant Supervision/Assistance  Equipment Recommendations  Rolling walker (2 wheels);3in1 (PT)    Recommendations for Other Services       Precautions / Restrictions Precautions Precautions: Fall Restrictions Weight Bearing Restrictions: No     Mobility  Bed Mobility               General bed mobility comments: sitting EIOB with daughter in law upon arrival    Transfers Overall transfer level: Needs assistance Equipment used: Rolling walker (2 wheels) Transfers: Sit to/from Stand Sit to Stand: Min guard           General transfer comment: cues for hand placements    Ambulation/Gait Ambulation/Gait assistance: Min guard Gait Distance (Feet): 400 Feet Assistive device: Rolling walker (2 wheels) Gait Pattern/deviations: Step-through pattern         Stairs Stairs: Yes Stairs assistance: Min guard Stair Management: One rail Left Number of Stairs: 4     Wheelchair Mobility    Modified Rankin (Stroke Patients Only)       Balance Overall balance assessment: Needs assistance Sitting-balance support: Feet supported Sitting balance-Leahy Scale: Good     Standing balance support: Bilateral upper extremity supported Standing balance-Leahy Scale: Fair  Cognition Arousal/Alertness: Awake/alert Behavior During Therapy: WFL for tasks assessed/performed;Impulsive Overall Cognitive Status: Impaired/Different from baseline                                 General Comments: some decreased processing time        Exercises Other Exercises Other Exercises: standing marches, SLR and heel raaises wihtout UE support and min gurard/assist    General Comments         Pertinent Vitals/Pain Pain Assessment: No/denies pain    Home Living                          Prior Function            PT Goals (current goals can now be found in the care plan section) Progress towards PT goals: Progressing toward goals    Frequency    7X/week      PT Plan Current plan remains appropriate;Other (comment)    Co-evaluation              AM-PAC PT "6 Clicks" Mobility   Outcome Measure  Help needed turning from your back to your side while in a flat bed without using bedrails?: None Help needed moving from lying on your back to sitting on the side of a flat bed without using bedrails?: A Little Help needed moving to and from a bed to a chair (including a wheelchair)?: A Little Help needed standing up from a chair using your arms (e.g., wheelchair or bedside chair)?: A Little Help needed to walk in hospital room?: A Little Help needed climbing 3-5 steps with a railing? : A Little 6 Click Score: 19    End of Session Equipment Utilized During Treatment: Gait belt Activity Tolerance: Patient tolerated treatment well Patient left: in chair;with call bell/phone within reach;with bed alarm set;with family/visitor present Nurse Communication: Mobility status PT Visit Diagnosis: Hemiplegia and hemiparesis;Muscle weakness (generalized) (M62.81) Hemiplegia - Right/Left: Right Hemiplegia - dominant/non-dominant: Dominant Hemiplegia - caused by: Cerebral infarction     Time: 1610-9604 PT Time Calculation (min) (ACUTE ONLY): 38 min  Charges:  $Gait Training: 8-22 mins $Therapeutic Exercise: 8-22 mins $Therapeutic Activity: 8-22 mins                    Danielle Dess, PTA 12/02/20, 11:18 AM

## 2020-12-02 NOTE — Progress Notes (Signed)
PROGRESS NOTE    Madison Brown   DZH:[id number]  DOB: [birth year]  PCP: Enid Baas, MD    DOA: 11/25/2020 LOS: 7    Brief Narrative / Hospital Course to Date:   Madison Brown is a 87 y.o. female seen in ed with complaints of Slurred speech and off balance  today morning. Last night pt went to bed at baseline and about 4 am she was not herself and had slurred speech and off balance. Pt reported to be leaning to the right side. Both daughter at bedside. Pt lives with spouse and at about 4 am when she got up to use restroom she was weak on right side and  knocked things down  by leaning on them  due to lack of balance.   Patient was found to have a subacute left occipital lobe stroke, an old right occipital and acute right posterior parietal stroke.  Neurology consulted.  Patient incidentally tested positive for Covid-19, without any complaints of respiratory symptoms.   Started on antibiotic for Klebsiella UTI.  Assessment & Plan   Principal Problem:   Slurred speech Active Problems:   CAD (coronary artery disease)   Essential hypertension, benign   Acute CVA (cerebrovascular accident) (HCC)   COVID-19 virus infection   Hx of CABG   S/P aortic valve replacement with bioprosthetic valve   Dementia (HCC)   Subacute CVA  Symptoms began early AM of 10/17.  MRI shows acute right posterior parietal lobe and subacute left occipital strokes.  CTA head and neck without large vessel occlusions. Passed swallow evaluation.  No events on tele.  --Neurology consulted -- Echo findings below --Telemetry --PT/OT - PT recommends CIR for rehab at discharge --Plavix loaded 300 mg, continue daily Plavix 75 mg --Full dose ASA given, continue ASA 81 mg daily --Continue high-intensity statin: Lipitor 80 mg daily  ECHO 11/26/2020  1. Left ventricular ejection fraction, by estimation, is 60 to 65%. The left ventricle has normal function. Left ventricular endocardial border not  optimally defined to evaluate regional wall motion. There is moderate left ventricular hypertrophy. Left  ventricular diastolic parameters are indeterminate.   2. Right ventricular systolic function is normal. The right ventricular size is normal.   3. The mitral valve is normal in structure. No evidence of mitral valve regurgitation. There is borderline mild mitral stenosis. The mean mitral valve gradient is 4.0 mmHg.   4. The aortic valve was not well visualized. Aortic valve regurgitation is not visualized. Mild aortic valve sclerosis is present, with no  evidence of aortic valve stenosis.    Hypokalemia -replacing potassium for K2.9 today (10/19).  Monitor BMP and replace as needed.  Covid-19 Infection - Incidental positive screen on admission.  Patient asymptomatic. - holding on paxlovid given need to continue atorvastatin   Elevated Hbg A1c / Prediabetes - Recent Hbg A1c 6.6%.  Repeat A1c this admission is 6.0%. Recommend monitoring as outpatient.   Hypertension - BP mildly elevated. - hold home amlodipine, relative permissive htn for now   History aortic stenosis s/p bioprosthetic aortic valve replacement - appears compensated Echo findings as above. Monitor.   CAD s/p 2-vessel CABG - stable, no active chest pain. Continue statin, aspirin   Dementia without behavioral disturbances.  Delirium precautions.   Underweight: Body mass index is 15.94 kg/m.   DVT prophylaxis: enoxaparin (LOVENOX) injection 30 mg Start: 11/27/20 2200 SCD's Start: 11/25/20 2106   Diet:  Diet Orders (From admission, onward)     Start  Ordered   11/29/20 1327  Diet regular Room service appropriate? Yes with Assist; Fluid consistency: Thin  Diet effective now       Question Answer Comment  Room service appropriate? Yes with Assist   Fluid consistency: Thin      11/29/20 1327              Code Status: Full Code   Subjective 12/02/20    Patient sitting up in recliner when seen  today.  She wants to go home.  She says she is walking much better.  Upset about still potentially requiring rehab due to lack of adequate supervision/assistance at home, per family.  Disposition Plan & Communication   Status is: Inpatient  Remains inpatient appropriate because: SNF placement pending    Family Communication: Daughter-in-law at bedside on rounds 10/23   Consults, Procedures, Significant Events   Consultants:  Neurology  Procedures:  Echo 11/27/2020, findings above  Antimicrobials:  Anti-infectives (From admission, onward)    Start     Dose/Rate Route Frequency Ordered Stop   11/27/20 1000  ceFAZolin (ANCEF) IVPB 1 g/50 mL premix        1 g 100 mL/hr over 30 Minutes Intravenous Every 12 hours 11/27/20 0916 11/29/20 2351   11/26/20 1800  cefTRIAXone (ROCEPHIN) 1 g in sodium chloride 0.9 % 100 mL IVPB  Status:  Discontinued        1 g 200 mL/hr over 30 Minutes Intravenous Every 24 hours 11/25/20 2108 11/27/20 0916   11/25/20 1815  cefTRIAXone (ROCEPHIN) 1 g in sodium chloride 0.9 % 100 mL IVPB        1 g 200 mL/hr over 30 Minutes Intravenous  Once 11/25/20 1807 11/25/20 1958         Micro    Objective   Vitals:   12/01/20 2149 12/02/20 0539 12/02/20 0842 12/02/20 1243  BP: 138/89 (!) 139/57 (!) 120/56 134/70  Pulse: 82 70 74 71  Resp:   16 16  Temp: 98.4 F (36.9 C) 98.2 F (36.8 C) 97.8 F (36.6 C) 98 F (36.7 C)  TempSrc: Oral Oral Oral Oral  SpO2: 98% 97% 99% 95%  Weight:      Height:       No intake or output data in the 24 hours ending 12/02/20 1638  Filed Weights   11/26/20 0201  Weight: 40.8 kg    Physical Exam:  General exam: Sitting in recliner, awake, alert, no acute distress, frail and underweight Respiratory system: Normal respiratory effort, on room air Cardiovascular system: Regular rate and rhythm, no peripheral edema Central nervous system: Normal speech, A&O x3, grossly nonfocal exam Psychiatry: normal mood,  congruent affect, abnormal judgment and insight due to dementia  Labs   Data Reviewed: I have personally reviewed following labs and imaging studies  CBC: Recent Labs  Lab 11/27/20 0358 11/28/20 0437 11/29/20 0421 11/30/20 0507 12/01/20 0527  WBC 12.2* 10.2 8.4 7.8 7.8  HGB 10.9* 10.5* 10.3* 10.8* 10.3*  HCT 33.4* 31.9* 29.9* 31.5* 31.0*  MCV 88.6 89.9 89.5 89.5 90.6  PLT 153 156 155 166 178   Basic Metabolic Panel: Recent Labs  Lab 11/27/20 0358 11/28/20 0437 11/29/20 0421 11/30/20 0507 12/01/20 0527  NA 135 137 136 138 137  K 2.9* 3.8 3.4* 3.6 3.6  CL 100 105 102 104 102  CO2 29 27 28 28  32  GLUCOSE 123* 107* 102* 116* 109*  BUN 15 21 17 19 19   CREATININE 1.24* 1.14* 1.02* 1.06*  1.08*  CALCIUM 8.2* 8.6* 8.3* 8.4* 8.4*  MG  --  2.1  --   --   --    GFR: Estimated Creatinine Clearance: 22.7 mL/min (A) (by C-G formula based on SCr of 1.08 mg/dL (H)). Liver Function Tests: No results for input(s): AST, ALT, ALKPHOS, BILITOT, PROT, ALBUMIN in the last 168 hours.  No results for input(s): LIPASE, AMYLASE in the last 168 hours. No results for input(s): AMMONIA in the last 168 hours. Coagulation Profile: No results for input(s): INR, PROTIME in the last 168 hours.  Cardiac Enzymes: No results for input(s): CKTOTAL, CKMB, CKMBINDEX, TROPONINI in the last 168 hours. BNP (last 3 results) No results for input(s): PROBNP in the last 8760 hours. HbA1C: No results for input(s): HGBA1C in the last 72 hours.  CBG: No results for input(s): GLUCAP in the last 168 hours. Lipid Profile: No results for input(s): CHOL, HDL, LDLCALC, TRIG, CHOLHDL, LDLDIRECT in the last 72 hours.  Thyroid Function Tests: No results for input(s): TSH, T4TOTAL, FREET4, T3FREE, THYROIDAB in the last 72 hours. Anemia Panel: No results for input(s): VITAMINB12, FOLATE, FERRITIN, TIBC, IRON, RETICCTPCT in the last 72 hours. Sepsis Labs: No results for input(s): PROCALCITON, LATICACIDVEN in the  last 168 hours.  Recent Results (from the past 240 hour(s))  Resp Panel by RT-PCR (Flu A&B, Covid) Urine, Clean Catch     Status: Abnormal   Collection Time: 11/25/20  5:29 PM   Specimen: Urine, Clean Catch; Nasopharyngeal(NP) swabs in vial transport medium  Result Value Ref Range Status   SARS Coronavirus 2 by RT PCR POSITIVE (A) NEGATIVE Final    Comment: RESULT CALLED TO, READ BACK BY AND VERIFIED WITH: MATT BASSETT @1952  ON 11/25/20 SK[facility] (NOTE) SARS-CoV-2 target nucleic acids are DETECTED.  The SARS-CoV-2 RNA is generally detectable in upper respiratory specimens during the acute phase of infection. Positive results are indicative of the presence of the identified virus, but do not rule out bacterial infection or co-infection with other pathogens not detected by the test. Clinical correlation with patient history and other diagnostic information is necessary to determine patient infection status. The expected result is Negative.  Fact Sheet for Patients: BloggerCourse.com  Fact Sheet for Healthcare Providers: SeriousBroker.it  This test is not yet approved or cleared by the [place] FDA and  has been authorized for detection and/or diagnosis of SARS-CoV-2 by FDA under an Emergency Use Authorization (EUA).  This EUA will remain in effect (meaning this test can b e used) for the duration of  the COVID-19 declaration under Section 564(b)(1) of the Act, 21 U.S.C. section 360bbb-3(b)(1), unless the authorization is terminated or revoked sooner.     Influenza A by PCR NEGATIVE NEGATIVE Final   Influenza B by PCR NEGATIVE NEGATIVE Final    Comment: (NOTE) The Xpert Xpress SARS-CoV-2/FLU/RSV plus assay is intended as an aid in the diagnosis of influenza from Nasopharyngeal swab specimens and should not be used as a sole basis for treatment. Nasal washings and aspirates are unacceptable for Xpert Xpress  SARS-CoV-2/FLU/RSV testing.  Fact Sheet for Patients: BloggerCourse.com  Fact Sheet for Healthcare Providers: SeriousBroker.it  This test is not yet approved or cleared by the [place] FDA and has been authorized for detection and/or diagnosis of SARS-CoV-2 by FDA under an Emergency Use Authorization (EUA). This EUA will remain in effect (meaning this test can be used) for the duration of the COVID-19 declaration under Section 564(b)(1) of the Act, 21 U.S.C. section 360bbb-3(b)(1), unless the authorization  is terminated or revoked.  Performed at [facility], [address] [place], [place] [address]   Urine Culture     Status: Abnormal   Collection Time: 11/25/20  5:29 PM   Specimen: Urine, Random  Result Value Ref Range Status   Specimen Description   Final    URINE, RANDOM Performed at [facility], [address] [place], [place] [address]    Special Requests   Final    NONE Performed at [facility], [address] [place], [place] [address]    Culture >=100,000 COLONIES/mL KLEBSIELLA PNEUMONIAE (A)  Final   Report Status 11/27/2020 FINAL  Final   Organism ID, Bacteria KLEBSIELLA PNEUMONIAE (A)  Final      Susceptibility   Klebsiella pneumoniae - MIC*    AMPICILLIN RESISTANT Resistant     CEFAZOLIN <=4 SENSITIVE Sensitive     CEFEPIME <=0.12 SENSITIVE Sensitive     CEFTRIAXONE <=0.25 SENSITIVE Sensitive     CIPROFLOXACIN <=0.25 SENSITIVE Sensitive     GENTAMICIN <=1 SENSITIVE Sensitive     IMIPENEM <=0.25 SENSITIVE Sensitive     NITROFURANTOIN <=16 SENSITIVE Sensitive     TRIMETH/SULFA <=20 SENSITIVE Sensitive     AMPICILLIN/SULBACTAM <=2 SENSITIVE Sensitive     PIP/TAZO <=4 SENSITIVE Sensitive     * >=100,000 COLONIES/mL KLEBSIELLA PNEUMONIAE  Blood culture (routine x 2)     Status: None   Collection Time: 11/25/20  7:00 PM   Specimen: BLOOD  Result Value Ref Range Status    Specimen Description BLOOD BLOOD RIGHT FOREARM  Final   Special Requests   Final    BOTTLES DRAWN AEROBIC AND ANAEROBIC Blood Culture results may not be optimal due to an inadequate volume of blood received in culture bottles   Culture   Final    NO GROWTH 7 DAYS Performed at [facility], [address], [place], [place] [address]    Report Status 12/02/2020 FINAL  Final  Culture, blood (Routine X 2) w Reflex to ID Panel     Status: None   Collection Time: 11/26/20 12:06 AM   Specimen: BLOOD  Result Value Ref Range Status   Specimen Description BLOOD LEFT ASSIST CONTROL  Final   Special Requests   Final    BOTTLES DRAWN AEROBIC ONLY Blood Culture adequate volume   Culture   Final    NO GROWTH 5 DAYS Performed at [facility], [address], [place], [place] [address]    Report Status 12/01/2020 FINAL  Final      Imaging Studies   No results found.   Medications   Scheduled Meds:  amLODipine  10 mg Oral Daily   aspirin  81 mg Oral Daily   atorvastatin  40 mg Oral Daily   clopidogrel  75 mg Oral Daily   enoxaparin (LOVENOX) injection  30 mg Subcutaneous Daily   feeding supplement  237 mL Oral BID BM   Continuous Infusions:  sodium chloride Stopped (11/30/20 0056)       LOS: 7 days    Time spent: 25 minutes with > 50% spent at bedside and in coordination of care     Pennie Banter, DO [facility]  12/02/2020, 4:38 PM      If 7PM-7AM, please contact night-coverage. How to contact the [facility] Attending or Consulting provider 7A - 7P or covering provider during after hours 7P -7A, for this patient?    Check the care team in [facility] and look for  a) attending/consulting [facility] provider listed and b) the [facility] team listed Log into [email or web address] and use [place]'s universal password to access. If you do not have the password, please contact the hospital operator. Locate the [facility] provider you are looking for under [facility] and  page to a number that you can be directly reached. If you still have difficulty reaching the provider, please page the [facility] (Director on Call) for the Hospitalists listed on amion for assistance.

## 2020-12-03 DIAGNOSIS — R4781 Slurred speech: Secondary | ICD-10-CM | POA: Diagnosis not present

## 2020-12-03 NOTE — Progress Notes (Signed)
Physical Therapy Treatment Patient Details Name: Madison Brown MRN: [id number] DOB: 29-Aug-1929 Today's Date: 12/03/2020   History of Present Illness 87 y.o. female who presents with slurred speech and balance difficulties. MRI brain revealed: acute infarction affecting the right posterior parietal lobe, subacute infarction of the left occipital lobe, old infarction of the right occipital lobe, and numerous old small vessel infarctions affecting both cerebellar hemispheres. Pt has past medical history of CAD, HTN, and Hyperlipidemia    PT Comments    Patient agreeable to PT and eager to get up and walk. Family member present during session. Patient required increased assistance today compared to previous session. Patient ambulated in the room and in hallway with and without rolling walker. Without assistive device, patient has more pronounced gait deficits and has a loss of balance with functional standing activity that required Mod A to correct to midline. With the use of rolling walker, patient has brief improvement of gait pattern and balance, however still required minimal assistance for rolling walker navigation and cues for safety/gait kinematics. Maximal verbal cues are needed for safety due to visual field impairments, difficulty problem solving and sequencing, and for fall prevention.  Discharge recommendation has been updated today. As patient is not at her baseline level of functional mobility, is requiring increased physical assistance this session with ambulation with and without assistive device, had a loss of balance, and due to safety concerns requiring constant verbal cues, SNF is recommended at discharge.    Recommendations for follow up therapy are one component of a multi-disciplinary discharge planning process, led by the attending physician.  Recommendations may be updated based on patient status, additional functional criteria and insurance authorization.  Follow Up  Recommendations  Skilled nursing-short term rehab (<3 hours/day)     Assistance Recommended at Discharge Frequent or constant Supervision/Assistance  Equipment Recommendations  Rolling walker (2 wheels);3in1 (PT)    Recommendations for Other Services       Precautions / Restrictions Precautions Precautions: Fall Restrictions Weight Bearing Restrictions: No     Mobility  Bed Mobility               General bed mobility comments: not assessed this session    Transfers Overall transfer level: Needs assistance Equipment used: None Transfers: Sit to/from Stand Sit to Stand: Min guard           General transfer comment: cues to walk closer to chair before sitting for safety and fall prevention. Min guard provided for safety    Ambulation/Gait Ambulation/Gait assistance: Mod assist Gait Distance (Feet): 415 Feet Assistive device:  (with and without rolling walker) Gait Pattern/deviations: Decreased stride length;Trunk flexed;Narrow base of support Gait velocity: decreased   General Gait Details: patient ambulated in the room and a lap in hallway without device. patient has mild left lateral lean, decreased step length and very narrow base of support with feet almost touching without device. intermittent carryover of instruction for posture and improved gait kinematics for brief periods, however unable to sustain due to fatigue. she is unsteady without device and needs Min A to Mod A for ambulation without rolling walker (baseline mobility status is independent ambulation without device). with the use of rolling walker, patient continues to need moderate verbal cues to widen base of support,  for erect posture, and to stand closer to walker. patient able to demonstrate improved gait pattern with use of rolling walker however required up to Min A for support for rolling walker negotiation.   Stairs  Wheelchair Mobility    Modified Rankin (Stroke  Patients Only)       Balance           Standing balance support: No upper extremity supported;During functional activity Standing balance-Leahy Scale: Poor Standing balance comment: when challenged with scanning environment, performing standing ADLs, or reaching outside base of support, patient required Min A- Mod A. one loss of balance to the left and posteriorly that required Mod A to correct to midline. high fall risk overall, especially without assistive device                            Cognition Arousal/Alertness: Awake/alert Behavior During Therapy: WFL for tasks assessed/performed Overall Cognitive Status: Impaired/Different from baseline Area of Impairment: Safety/judgement;Memory                     Memory: Decreased recall of precautions;Decreased short-term memory   Safety/Judgement: Decreased awareness of deficits;Decreased awareness of safety   Problem Solving: Requires verbal cues;Difficulty sequencing General Comments: patient with decreased awareness of her visual deficits but is aware she she needs assistance. patient does demonstrate carry over by verbally stating need for improved gait kintematics without prompting        Exercises      General Comments General comments (skin integrity, edema, etc.): patient with visual deficits noted during session and required maximal cues overall for safety to locate objects, navigate around room and hallway, for safety with mobility, and for improved gait kinematics.      Pertinent Vitals/Pain Pain Assessment: No/denies pain    Home Living                          Prior Function            PT Goals (current goals can now be found in the care plan section) Acute Rehab PT Goals Patient Stated Goal: to walk PT Goal Formulation: With patient/family Time For Goal Achievement: 12/10/20 Potential to Achieve Goals: Good Progress towards PT goals: Progressing toward goals     Frequency    7X/week      PT Plan Discharge plan needs to be updated    Co-evaluation              AM-PAC PT "6 Clicks" Mobility   Outcome Measure  Help needed turning from your back to your side while in a flat bed without using bedrails?: None Help needed moving from lying on your back to sitting on the side of a flat bed without using bedrails?: A Little Help needed moving to and from a bed to a chair (including a wheelchair)?: A Little Help needed standing up from a chair using your arms (e.g., wheelchair or bedside chair)?: A Little Help needed to walk in hospital room?: A Lot Help needed climbing 3-5 steps with a railing? : A Little 6 Click Score: 18    End of Session Equipment Utilized During Treatment: Gait belt Activity Tolerance: Patient tolerated treatment well Patient left: in chair;with call bell/phone within reach;with chair alarm set;with family/visitor present Nurse Communication: Mobility status PT Visit Diagnosis: Hemiplegia and hemiparesis;Muscle weakness (generalized) (M62.81) Hemiplegia - Right/Left: Right Hemiplegia - dominant/non-dominant: Dominant Hemiplegia - caused by: Cerebral infarction     Time: 9937-1696 PT Time Calculation (min) (ACUTE ONLY): 43 min  Charges:  $Gait Training: 23-37 mins $Therapeutic Activity: 8-22 mins  Donna Bernard, PT, MPT    Madison Brown 12/03/2020, 12:37 PM

## 2020-12-03 NOTE — Care Management Important Message (Signed)
Important Message  Patient Details  Name: DEANNIE RESETAR MRN: [id number] Date of Birth: 1929-05-21   Medicare Important Message Given:  Yes  I reviewed the Important Message with the patients daughter, Ricci Barker ([phone]) and she understands the content of the form. I sent a copy via secure e-mail to [email or web address] as directed and thanked her for her time.  Olegario Messier A Vastie Douty 12/03/2020, 3:33 PM

## 2020-12-03 NOTE — Progress Notes (Signed)
Occupational Therapy Treatment Patient Details Name: Madison Brown MRN: [id number] DOB: 07/16/1929 Today's Date: 12/03/2020   History of present illness 87 y.o. female who presents with slurred speech and balance difficulties. MRI brain revealed: acute infarction affecting the right posterior parietal lobe, subacute infarction of the left occipital lobe, old infarction of the right occipital lobe, and numerous old small vessel infarctions affecting both cerebellar hemispheres. Pt has past medical history of CAD, HTN, and Hyperlipidemia   OT comments  Chart reviewed, pt greeted in room with daughter in law present. Pt is agreeable to OT tx session. Pt is oriented to self and place. Tx session targeted progressing functional mobility while targeting appropriate scanning/visual techniques for safe ADL completion. Pt and daughter in law educated on scanning techniques to facilitate safe mobility. Fair-poor carry over noted with step by step vcs required throughout all tasks for safety and appropriate AE use.  Pt is left in bedside chair, NAD, all needs met +chair alarm. While pt is progressing towards goal acquisition, she continues to perform below PLOF, affecting safe completion of ADL/IADL tasks due to impaired balance, vision, and cognition. OT recommendations have been updated and at this time OT recommends discharge to SNF. OT will continue to follow while admitted.     Recommendations for follow up therapy are one component of a multi-disciplinary discharge planning process, led by the attending physician.  Recommendations may be updated based on patient status, additional functional criteria and insurance authorization.    Follow Up Recommendations  Skilled nursing-short term rehab (<3 hours/day)    Assistance Recommended at Discharge Frequent or constant Supervision/Assistance  Equipment Recommendations  BSC;Tub/shower seat    Recommendations for Other Services      Precautions /  Restrictions Precautions Precautions: Fall Restrictions Weight Bearing Restrictions: No       Mobility Bed Mobility               General bed mobility comments: not assessed this session    Transfers Overall transfer level: Needs assistance Equipment used: Rolling walker (2 wheels) Transfers: Sit to/from Stand;Stand Pivot Transfers Sit to Stand: Min assist Stand pivot transfers: Min guard         General transfer comment: step by step vcs for walker placement     Balance           Standing balance support: No upper extremity supported;During functional activity Standing balance-Leahy Scale: Poor Standing balance comment: CGA required with RW for all standing ADL tasks.                           ADL either performed or assessed with clinical judgement   ADL Overall ADL's : Needs assistance/impaired                                       General ADL Comments: CGA for functional mobility to bathroom with RW, CGA for grooming in standing at sink, CGA required for ADL clean up; Step by step vcs required for visual scanning through full visual field, safety, RW use     Vision   Additional Comments: peripheral vision loss   Perception     Praxis      Cognition Arousal/Alertness: Awake/alert Behavior During Therapy: WFL for tasks assessed/performed Overall Cognitive Status: Impaired/Different from baseline Area of Impairment: Safety/judgement;Memory;Attention  Current Attention Level: Focused Memory: Decreased recall of precautions;Decreased short-term memory   Safety/Judgement: Decreased awareness of safety;Decreased awareness of deficits Awareness: Intellectual Problem Solving: Requires verbal cues;Difficulty sequencing General Comments: step by step vcs required throughout; minimal carry over of skills noted within session, will continue to assess          Exercises Other Exercises Other  Exercises: visual scanning techniques including safe head turn to facilitate improved safety while completing ADL/with mobility   Shoulder Instructions       General Comments patient with visual deficits noted during session and required maximal cues overall for safety to locate objects, navigate around room and hallway, for safety with mobility, and for improved gait kinematics.    Pertinent Vitals/ Pain       Pain Assessment: No/denies pain  Home Living                                          Prior Functioning/Environment              Frequency  Min 2X/week        Progress Toward Goals  OT Goals(current goals can now be found in the care plan section)  Progress towards OT goals: Progressing toward goals  Acute Rehab OT Goals OT Goal Formulation: With patient  Plan Discharge plan needs to be updated    Co-evaluation                 AM-PAC OT "6 Clicks" Daily Activity     Outcome Measure   Help from another person eating meals?: None Help from another person taking care of personal grooming?: A Little Help from another person toileting, which includes using toliet, bedpan, or urinal?: A Little Help from another person bathing (including washing, rinsing, drying)?: A Little Help from another person to put on and taking off regular upper body clothing?: A Little Help from another person to put on and taking off regular lower body clothing?: A Little 6 Click Score: 19    End of Session Equipment Utilized During Treatment: Gait belt;Rolling walker (2 wheels)  OT Visit Diagnosis: Unsteadiness on feet (R26.81);History of falling (Z91.81)   Activity Tolerance Patient tolerated treatment well   Patient Left in chair;with call bell/phone within reach;with chair alarm set (Daugther in law entering room as OT exited)   Nurse Communication Mobility status        Time: 1233-1300 OT Time Calculation (min): 27 min  Charges: OT General  Charges $OT Visit: 1 Visit OT Treatments $Self Care/Home Management : 8-22 mins $Cognitive Funtion inital: Initial 15 mins  Shanon Payor, OTD OTR/L  12/03/20, 2:39 PM

## 2020-12-03 NOTE — Progress Notes (Signed)
PROGRESS NOTE    Madison Brown   KWI:[id number]  DOB: May 12, 1929  PCP: Enid Baas, MD    DOA: 11/25/2020 LOS: 8    Brief Narrative / Hospital Course to Date:   Madison Brown is a 87 y.o. female seen in ed with complaints of Slurred speech and off balance  today morning. Last night pt went to bed at baseline and about 4 am she was not herself and had slurred speech and off balance. Pt reported to be leaning to the right side. Both daughter at bedside. Pt lives with spouse and at about 4 am when she got up to use restroom she was weak on right side and  knocked things down  by leaning on them  due to lack of balance.   Patient was found to have a subacute left occipital lobe stroke, an old right occipital and acute right posterior parietal stroke.  Neurology consulted.  Patient incidentally tested positive for Covid-19, without any complaints of respiratory symptoms.   Started on antibiotic for Klebsiella UTI.  Assessment & Plan   Principal Problem:   Slurred speech Active Problems:   CAD (coronary artery disease)   Essential hypertension, benign   Acute CVA (cerebrovascular accident) (HCC)   COVID-19 virus infection   Hx of CABG   S/P aortic valve replacement with bioprosthetic valve   Dementia (HCC)   Subacute CVA  Symptoms of slurred speech and difficulty balancing began early AM of 10/17.  MRI showed acute right posterior parietal lobe and subacute left occipital strokes.  CTA head and neck without large vessel occlusions. Passed swallow evaluation.  No events on telemetry -discontinued --Neurology consulted -- Echo findings below --PT/OT - PT recommends home health PT OT with supervised mobility TOC attempting SNF placement at family's request due to safety concerns and lack of supervision at home.   --Plavix loaded 300 mg, continue daily Plavix 75 mg --Full dose ASA given, continue ASA 81 mg daily --Continue high-intensity statin: Lipitor 80 mg  daily  ECHO 11/26/2020  1. Left ventricular ejection fraction, by estimation, is 60 to 65%. The left ventricle has normal function. Left ventricular endocardial border not optimally defined to evaluate regional wall motion. There is moderate left ventricular hypertrophy. Left  ventricular diastolic parameters are indeterminate.   2. Right ventricular systolic function is normal. The right ventricular size is normal.   3. The mitral valve is normal in structure. No evidence of mitral valve regurgitation. There is borderline mild mitral stenosis. The mean mitral valve gradient is 4.0 mmHg.   4. The aortic valve was not well visualized. Aortic valve regurgitation is not visualized. Mild aortic valve sclerosis is present, with no  evidence of aortic valve stenosis.    Hypokalemia -resolved with replacement.  Monitor BMP and replace as needed.  Covid-19 Infection - Incidental positive screen on admission.  Patient asymptomatic.   Elevated Hbg A1c / Prediabetes - Recent Hbg A1c 6.6%.  Repeat A1c this admission is 6.0%. Recommend monitoring as outpatient.   Hypertension - BP mildly elevated. -Home amlodipine initially held for permissive hypertension.  Resumed.   History aortic stenosis s/p bioprosthetic aortic valve replacement - appears compensated Echo findings as above. Monitor.   CAD s/p 2-vessel CABG - stable, no active chest pain. Continue statin, aspirin   Dementia without behavioral disturbances.  Delirium precautions.   Underweight: Body mass index is 15.94 kg/m.   DVT prophylaxis: enoxaparin (LOVENOX) injection 30 mg Start: 11/27/20 2200 SCD's Start: 11/25/20 2106  Diet:  Diet Orders (From admission, onward)     Start     Ordered   11/29/20 1327  Diet regular Room service appropriate? Yes with Assist; Fluid consistency: Thin  Diet effective now       Question Answer Comment  Room service appropriate? Yes with Assist   Fluid consistency: Thin      11/29/20 1327               Code Status: Full Code   Subjective 12/03/20    Patient up in recliner visiting with family on rounds today.  Patient reports feeling well and has no acute complaints.  Continues to states she just wants to go home.  No acute events reported.  Disposition Plan & Communication   Status is: Inpatient  Remains inpatient appropriate because: Unsafe discharge.  SNF placement pending.  Family declined home health and feel patient unsafe to return home due to high falls risk and not having 24-hour supervision for mobility.    Family Communication: Daughter-in-law updated in hallway today 10/25.  Daughter and grandson at bedside on rounds as well.   Consults, Procedures, Significant Events   Consultants:  Neurology  Procedures:  Echo 11/27/2020, findings above  Antimicrobials:  Anti-infectives (From admission, onward)    Start     Dose/Rate Route Frequency Ordered Stop   11/27/20 1000  ceFAZolin (ANCEF) IVPB 1 g/50 mL premix        1 g 100 mL/hr over 30 Minutes Intravenous Every 12 hours 11/27/20 0916 11/29/20 2351   11/26/20 1800  cefTRIAXone (ROCEPHIN) 1 g in sodium chloride 0.9 % 100 mL IVPB  Status:  Discontinued        1 g 200 mL/hr over 30 Minutes Intravenous Every 24 hours 11/25/20 2108 11/27/20 0916   11/25/20 1815  cefTRIAXone (ROCEPHIN) 1 g in sodium chloride 0.9 % 100 mL IVPB        1 g 200 mL/hr over 30 Minutes Intravenous  Once 11/25/20 1807 11/25/20 1958         Micro    Objective   Vitals:   12/03/20 0017 12/03/20 0335 12/03/20 0751 12/03/20 1126  BP: 122/65 121/62 (!) 153/66 (!) 134/55  Pulse: 75 71 85 73  Resp: 16 16 16 16   Temp: 98.2 F (36.8 C) 98.9 F (37.2 C) 97.6 F (36.4 C) 98 F (36.7 C)  TempSrc: Oral     SpO2: 97% 97% 95% 98%  Weight:      Height:       No intake or output data in the 24 hours ending 12/03/20 1423  Filed Weights   11/26/20 0201  Weight: 40.8 kg    Physical Exam:  General exam: Awake sitting in  recliner, no acute distress, frail, underweight Respiratory system: Lungs clear bilaterally, normal respiratory effort, on room air Cardiovascular system: Regular rate and rhythm, no peripheral edema Central nervous system: Normal speech, A&O x3, grossly nonfocal exam Psychiatry: normal mood, congruent affect  Labs   Data Reviewed: I have personally reviewed following labs and imaging studies  CBC: Recent Labs  Lab 11/27/20 0358 11/28/20 0437 11/29/20 0421 11/30/20 0507 12/01/20 0527  WBC 12.2* 10.2 8.4 7.8 7.8  HGB 10.9* 10.5* 10.3* 10.8* 10.3*  HCT 33.4* 31.9* 29.9* 31.5* 31.0*  MCV 88.6 89.9 89.5 89.5 90.6  PLT 153 156 155 166 178   Basic Metabolic Panel: Recent Labs  Lab 11/27/20 0358 11/28/20 0437 11/29/20 0421 11/30/20 0507 12/01/20 0527  NA 135 137 136  138 137  K 2.9* 3.8 3.4* 3.6 3.6  CL 100 105 102 104 102  CO2 29 27 28 28  32  GLUCOSE 123* 107* 102* 116* 109*  BUN 15 21 17 19 19   CREATININE 1.24* 1.14* 1.02* 1.06* 1.08*  CALCIUM 8.2* 8.6* 8.3* 8.4* 8.4*  MG  --  2.1  --   --   --    GFR: Estimated Creatinine Clearance: 22.7 mL/min (A) (by C-G formula based on SCr of 1.08 mg/dL (H)). Liver Function Tests: No results for input(s): AST, ALT, ALKPHOS, BILITOT, PROT, ALBUMIN in the last 168 hours.  No results for input(s): LIPASE, AMYLASE in the last 168 hours. No results for input(s): AMMONIA in the last 168 hours. Coagulation Profile: No results for input(s): INR, PROTIME in the last 168 hours.  Cardiac Enzymes: No results for input(s): CKTOTAL, CKMB, CKMBINDEX, TROPONINI in the last 168 hours. BNP (last 3 results) No results for input(s): PROBNP in the last 8760 hours. HbA1C: No results for input(s): HGBA1C in the last 72 hours.  CBG: No results for input(s): GLUCAP in the last 168 hours. Lipid Profile: No results for input(s): CHOL, HDL, LDLCALC, TRIG, CHOLHDL, LDLDIRECT in the last 72 hours.  Thyroid Function Tests: No results for input(s):  TSH, T4TOTAL, FREET4, T3FREE, THYROIDAB in the last 72 hours. Anemia Panel: No results for input(s): VITAMINB12, FOLATE, FERRITIN, TIBC, IRON, RETICCTPCT in the last 72 hours. Sepsis Labs: No results for input(s): PROCALCITON, LATICACIDVEN in the last 168 hours.  Recent Results (from the past 240 hour(s))  Resp Panel by RT-PCR (Flu A&B, Covid) Urine, Clean Catch     Status: Abnormal   Collection Time: 11/25/20  5:29 PM   Specimen: Urine, Clean Catch; Nasopharyngeal(NP) swabs in vial transport medium  Result Value Ref Range Status   SARS Coronavirus 2 by RT PCR POSITIVE (A) NEGATIVE Final    Comment: RESULT CALLED TO, READ BACK BY AND VERIFIED WITH: MATT BASSETT @1952  ON 11/25/20 SK[facility] (NOTE) SARS-CoV-2 target nucleic acids are DETECTED.  The SARS-CoV-2 RNA is generally detectable in upper respiratory specimens during the acute phase of infection. Positive results are indicative of the presence of the identified virus, but do not rule out bacterial infection or co-infection with other pathogens not detected by the test. Clinical correlation with patient history and other diagnostic information is necessary to determine patient infection status. The expected result is Negative.  Fact Sheet for Patients: 11/27/20  Fact Sheet for Healthcare Providers:  This test is not yet approved or cleared by the 11/27/20 FDA and  has been authorized for detection and/or diagnosis of SARS-CoV-2 by FDA under an Emergency Use Authorization (EUA).  This EUA will remain in effect (meaning this test can b e used) for the duration of  the COVID-19 declaration under Section 564(b)(1) of the Act, 21 U.S.C. section 360bbb-3(b)(1), unless the authorization is terminated or revoked sooner.     Influenza A by PCR NEGATIVE NEGATIVE Final   Influenza B by PCR NEGATIVE NEGATIVE Final    Comment: (NOTE) The Xpert Xpress  SARS-CoV-2/FLU/RSV plus assay is intended as an aid in the diagnosis of influenza from Nasopharyngeal swab specimens and should not be used as a sole basis for treatment. Nasal washings and aspirates are unacceptable for Xpert Xpress SARS-CoV-2/FLU/RSV testing.  Fact Sheet for Patients: BloggerCourse.com  Fact Sheet for Healthcare Providers: SeriousBroker.it  This test is not yet approved or cleared by the [place] FDA and has been authorized for detection and/or  diagnosis of SARS-CoV-2 by FDA under an Emergency Use Authorization (EUA). This EUA will remain in effect (meaning this test can be used) for the duration of the COVID-19 declaration under Section 564(b)(1) of the Act, 21 U.S.C. section 360bbb-3(b)(1), unless the authorization is terminated or revoked.  Performed at [facility], [address] [place], [place] [address]   Urine Culture     Status: Abnormal   Collection Time: 11/25/20  5:29 PM   Specimen: Urine, Random  Result Value Ref Range Status   Specimen Description   Final    URINE, RANDOM Performed at [facility], [address] [place], [place] [address]    Special Requests   Final    NONE Performed at [facility], [address] [place], [place] [address]    Culture >=100,000 COLONIES/mL KLEBSIELLA PNEUMONIAE (A)  Final   Report Status 11/27/2020 FINAL  Final   Organism ID, Bacteria KLEBSIELLA PNEUMONIAE (A)  Final      Susceptibility   Klebsiella pneumoniae - MIC*    AMPICILLIN RESISTANT Resistant     CEFAZOLIN <=4 SENSITIVE Sensitive     CEFEPIME <=0.12 SENSITIVE Sensitive     CEFTRIAXONE <=0.25 SENSITIVE Sensitive     CIPROFLOXACIN <=0.25 SENSITIVE Sensitive     GENTAMICIN <=1 SENSITIVE Sensitive     IMIPENEM <=0.25 SENSITIVE Sensitive     NITROFURANTOIN <=16 SENSITIVE Sensitive     TRIMETH/SULFA <=20 SENSITIVE Sensitive     AMPICILLIN/SULBACTAM <=2 SENSITIVE  Sensitive     PIP/TAZO <=4 SENSITIVE Sensitive     * >=100,000 COLONIES/mL KLEBSIELLA PNEUMONIAE  Blood culture (routine x 2)     Status: None   Collection Time: 11/25/20  7:00 PM   Specimen: BLOOD  Result Value Ref Range Status   Specimen Description BLOOD BLOOD RIGHT FOREARM  Final   Special Requests   Final    BOTTLES DRAWN AEROBIC AND ANAEROBIC Blood Culture results may not be optimal due to an inadequate volume of blood received in culture bottles   Culture   Final    NO GROWTH 7 DAYS Performed at [facility], [address], [place], [place] [address]    Report Status 12/02/2020 FINAL  Final  Culture, blood (Routine X 2) w Reflex to ID Panel     Status: None   Collection Time: 11/26/20 12:06 AM   Specimen: BLOOD  Result Value Ref Range Status   Specimen Description BLOOD LEFT ASSIST CONTROL  Final   Special Requests   Final    BOTTLES DRAWN AEROBIC ONLY Blood Culture adequate volume   Culture   Final    NO GROWTH 5 DAYS Performed at [facility], [address], [place], [place] [address]    Report Status 12/01/2020 FINAL  Final      Imaging Studies   No results found.   Medications   Scheduled Meds:  amLODipine  10 mg Oral Daily   aspirin  81 mg Oral Daily   atorvastatin  40 mg Oral Daily   clopidogrel  75 mg Oral Daily   enoxaparin (LOVENOX) injection  30 mg Subcutaneous Daily   feeding supplement  237 mL Oral BID BM   Continuous Infusions:  sodium chloride Stopped (11/30/20 0056)       LOS: 8 days    Time spent: 25 minutes with > 50% spent at bedside and in coordination of care     Pennie Banter, DO [facility]  12/03/2020, 2:23 PM  If 7PM-7AM, please contact night-coverage. How to contact the [facility] Attending or Consulting provider 7A - 7P or covering provider during after hours 7P -7A, for this patient?    Check the care team in [facility] and look for a) attending/consulting [facility] provider listed and b) the [facility]  team listed Log into [email or web address] and use [place]'s universal password to access. If you do not have the password, please contact the hospital operator. Locate the [facility] provider you are looking for under [facility] and page to a number that you can be directly reached. If you still have difficulty reaching the provider, please page the [facility] (Director on Call) for the Hospitalists listed on amion for assistance.

## 2020-12-04 DIAGNOSIS — E43 Unspecified severe protein-calorie malnutrition: Secondary | ICD-10-CM | POA: Diagnosis not present

## 2020-12-04 DIAGNOSIS — U071 COVID-19: Secondary | ICD-10-CM | POA: Diagnosis not present

## 2020-12-04 DIAGNOSIS — I639 Cerebral infarction, unspecified: Secondary | ICD-10-CM | POA: Diagnosis not present

## 2020-12-04 LAB — BASIC METABOLIC PANEL
Anion gap: 6 (ref 5–15)
BUN: 19 mg/dL (ref 8–23)
CO2: 28 mmol/L (ref 22–32)
Calcium: 8.5 mg/dL — ABNORMAL LOW (ref 8.9–10.3)
Chloride: 103 mmol/L (ref 98–111)
Creatinine, Ser: 1.04 mg/dL — ABNORMAL HIGH (ref 0.44–1.00)
GFR, Estimated: 51 mL/min — ABNORMAL LOW (ref >60)
Glucose, Bld: 98 mg/dL (ref 70–99)
Potassium: 3.7 mmol/L (ref 3.5–5.1)
Sodium: 137 mmol/L (ref 135–145)

## 2020-12-04 NOTE — Assessment & Plan Note (Signed)
MRI showing acute right posterior parietal lobe and subacute left occipital stroke.  Neurology recommends baby aspirin for 90 days, continue Plavix and statin PT/OT recommends SNF.  TOC working on it.  Patient symptom resolved.

## 2020-12-04 NOTE — Assessment & Plan Note (Signed)
Incidental finding on admission.  Patient is asymptomatic.

## 2020-12-04 NOTE — Progress Notes (Signed)
  Progress Note    JARED CAHN   IOE:[id number]  DOB: [birth year]  DOA: 11/25/2020     9 Date of Service: 12/04/2020   Clinical Course  Madison Brown is a 87 y.o. female seen in ed with complaints of Slurred speech and off balance  today morning. Last night pt went to bed at baseline and about 4 am she was not herself and had slurred speech and off balance. Pt reported to be leaning to the right side. Both daughter at bedside. Pt lives with spouse and at about 4 am when she got up to use restroom she was weak on right side and  knocked things down  by leaning on them  due to lack of balance.    Patient was found to have a subacute left occipital lobe stroke, an old right occipital and acute right posterior parietal stroke.  Neurology consulted.   Patient incidentally tested positive for Covid-19, without any complaints of respiratory symptoms.    Started on antibiotic for Klebsiella UTI.  10/26 -PT/OT recommendations updated to SNF.  TOC working on placement   Assessment and Plan * Acute CVA (cerebrovascular accident) (HCC) MRI showing acute right posterior parietal lobe and subacute left occipital stroke.  Neurology recommends baby aspirin for 90 days, continue Plavix and statin PT/OT recommends SNF.  TOC working on it.  Patient symptom resolved.  Protein-calorie malnutrition, severe (HCC) BMI 15.94  Hx of CABG History of CAD status post CABG x2.  Continue aspirin and statin  COVID-19 virus infection Incidental finding on admission.  Patient is asymptomatic.  Underweight BMI 15.94 -she has severe protein calorie malnutrition  Essential hypertension, benign Continue amlodipine -blood pressure well controlled at this time   CAD (coronary artery disease) Continue aspirin, statin    Subjective:  Denies any new complaints.  Sitting in chair.  Daughter-in-law at bedside.  Patient is hoping to get discharge home soon  Objective Vitals:   12/03/20 1555 12/03/20 2251  12/04/20 0851 12/04/20 1224  BP: 124/62 140/63 123/60 130/70  Pulse: 78 77 81 73  Resp: 18 18 16 16   Temp: 97.7 F (36.5 C) 97.7 F (36.5 C) (!) 97.5 F (36.4 C) 99.2 F (37.3 C)  TempSrc:  Oral Oral   SpO2: 100% 96% 96% 97%  Weight:      Height:       40.8 kg  Vital signs were reviewed and unremarkable.   Exam Physical Exam   General exam: Awake sitting in recliner, no acute distress, frail, underweight Respiratory system: Lungs clear bilaterally, normal respiratory effort, on room air Cardiovascular system: Regular rate and rhythm, no peripheral edema Central nervous system: Normal speech, A&O x3, grossly nonfocal exam Psychiatry: normal mood, congruent affect  Labs / Other Information My review of labs, imaging, notes and other tests shows no new significant findings.    Disposition Plan: Status is: Inpatient  Remains inpatient appropriate because: Waiting for SNF placement.     Time spent: 25 minutes [facility] 12/04/2020, 3:44 PM

## 2020-12-04 NOTE — TOC Progression Note (Signed)
Transition of Care [facility]) - Progression Note    Patient Details  Name: Madison Brown MRN: [id number] Date of Birth: May 20, 1929  Transition of Care [facility]) CM/SW Contact  Caryn Section, RN Phone Number: 12/04/2020, 11:33 AM  Clinical Narrative:   Recommendations changed to SNF, as family requesting SNF at this time.  [facility] accepted, patient's family requests [facility].  Inquiry in to [facility], awaiting response.    TOC contact information provided, TOC to follow.    Expected Discharge Plan: IP Rehab Facility (CIR recommended and will evaluate)    Expected Discharge Plan and Services Expected Discharge Plan: IP Rehab Facility (CIR recommended and will evaluate)     Post Acute Care Choice:  (TBD)                                         Social Determinants of Health (SDOH) Interventions    Readmission Risk Interventions No flowsheet data found.

## 2020-12-04 NOTE — Progress Notes (Signed)
Physical Therapy Treatment Patient Details Name: Madison Brown MRN: [id number] DOB: 08/24/1929 Today's Date: 12/04/2020   History of Present Illness 87 y.o. female who presents with slurred speech and balance difficulties. MRI brain revealed: acute infarction affecting the right posterior parietal lobe, subacute infarction of the left occipital lobe, old infarction of the right occipital lobe, and numerous old small vessel infarctions affecting both cerebellar hemispheres. Pt has past medical history of CAD, HTN, and Hyperlipidemia    PT Comments    Pt was awake laying in bed full of urine upon arriving. She is aware of peeing in bed. She is alert however has history of cognition deficits at baseline. Poor R visual field and slight impulsivity makes pt a very high fall risk. She does well ambulating with RW however at baseline does not use assistive deficits. Pt has several occasions of LOB without use of AD. Author feels pt will benefit from SNF at DC to address deficits while maximizing independence + assisting pt with returning to PLOF.   Recommendations for follow up therapy are one component of a multi-disciplinary discharge planning process, led by the attending physician.  Recommendations may be updated based on patient status, additional functional criteria and insurance authorization.  Follow Up Recommendations  Skilled nursing-short term rehab (<3 hours/day)     Assistance Recommended at Discharge Frequent or constant Supervision/Assistance  Equipment Recommendations  Rolling walker (2 wheels);3in1 (PT)       Precautions / Restrictions Precautions Precautions: Fall Precaution Comments: high fall risk Restrictions Weight Bearing Restrictions: No     Mobility  Bed Mobility Overal bed mobility: Needs Assistance Bed Mobility: Supine to Sit     Supine to sit: Min assist;HOB elevated     General bed mobility comments: min assist to safely exit L side of bed. poor vision  and slight impulsivity.    Transfers Overall transfer level: Needs assistance Equipment used: Rolling walker (2 wheels);None Transfers: Sit to/from Stand Sit to Stand: Min guard;Min assist           General transfer comment: CGA for safety to stand to RW however min assist due to balance deficits to stand without AD. pt has overall poor safety awareness and is high fall risk. Vision greatly impacts pts abilities.    Ambulation/Gait Ambulation/Gait assistance: Mod assist;Min guard Gait Distance (Feet): 200 Feet Assistive device: Rolling walker (2 wheels);None Gait Pattern/deviations: Step-through pattern;Trunk flexed;Scissoring;Narrow base of support Gait velocity: decreased   General Gait Details: Pt ambulated with and without AD. CGA + moderate vcs for navigating obsticle sin hallway with use of RW. Mod assist to prevent falling several times without use of AD      Balance Overall balance assessment: Needs assistance Sitting-balance support: Feet supported Sitting balance-Leahy Scale: Good Sitting balance - Comments: no LOB or unsteadiness during seated activity today. had pt perform higher level dexterity exercises   Standing balance support: No upper extremity supported;During functional activity Standing balance-Leahy Scale: Poor Standing balance comment: Extremely high fall risk with vision deficits, more safety awareness, + slight impulsivity      Cognition Arousal/Alertness: Awake/alert Behavior During Therapy: WFL for tasks assessed/performed Overall Cognitive Status: Impaired/Different from baseline Area of Impairment: Safety/judgement;Memory;Attention    Orientation Level: Person;Place Current Attention Level: Focused Memory: Decreased recall of precautions;Decreased short-term memory   Safety/Judgement: Decreased awareness of safety;Decreased awareness of deficits Awareness: Intellectual Problem Solving: Slow processing;Decreased initiation;Difficulty  sequencing;Requires verbal cues;Requires tactile cues General Comments: Pt is alert but still presents with cognition deficits that  impacts session progression               Pertinent Vitals/Pain Pain Assessment: No/denies pain Faces Pain Scale: No hurt Breathing: normal Negative Vocalization: none Facial Expression: smiling or inexpressive Body Language: relaxed     PT Goals (current goals can now be found in the care plan section) Acute Rehab PT Goals Patient Stated Goal: go home Progress towards PT goals: Progressing toward goals    Frequency    7X/week      PT Plan Current plan remains appropriate    Co-evaluation     PT goals addressed during session: Mobility/safety with mobility;Balance;Proper use of DME        AM-PAC PT "6 Clicks" Mobility   Outcome Measure  Help needed turning from your back to your side while in a flat bed without using bedrails?: None Help needed moving from lying on your back to sitting on the side of a flat bed without using bedrails?: A Little Help needed moving to and from a bed to a chair (including a wheelchair)?: A Little Help needed standing up from a chair using your arms (e.g., wheelchair or bedside chair)?: A Little Help needed to walk in hospital room?: A Lot Help needed climbing 3-5 steps with a railing? : A Little 6 Click Score: 18    End of Session Equipment Utilized During Treatment: Gait belt Activity Tolerance: Patient tolerated treatment well Patient left: in chair;with call bell/phone within reach;with chair alarm set;with family/visitor present Nurse Communication: Mobility status PT Visit Diagnosis: Hemiplegia and hemiparesis;Muscle weakness (generalized) (M62.81) Hemiplegia - Right/Left: Right Hemiplegia - dominant/non-dominant: Dominant Hemiplegia - caused by: Cerebral infarction     Time: 0812-0845 PT Time Calculation (min) (ACUTE ONLY): 33 min  Charges:  $Gait Training: 8-22 mins $Neuromuscular  Re-education: 8-22 mins                    Jetta Lout PTA 12/04/20, 10:27 AM

## 2020-12-04 NOTE — Assessment & Plan Note (Signed)
History of CAD status post CABG x2.  Continue aspirin and statin

## 2020-12-04 NOTE — Assessment & Plan Note (Signed)
BMI 15.94

## 2020-12-04 NOTE — Hospital Course (Addendum)
Madison Brown is a 87 y.o. female seen in ed with complaints of Slurred speech and off balance  today morning. Last night pt went to bed at baseline and about 4 am she was not herself and had slurred speech and off balance. Pt reported to be leaning to the right side. Both daughter at bedside. Pt lives with spouse and at about 4 am when she got up to use restroom she was weak on right side and  knocked things down  by leaning on them  due to lack of balance.    Patient was found to have a subacute left occipital lobe stroke, an old right occipital and acute right posterior parietal stroke.  Neurology consulted.   Patient incidentally tested positive for Covid-19, without any complaints of respiratory symptoms.    Started on antibiotic for Klebsiella UTI.  10/26 -PT/OT recommendations updated to SNF.  TOC working on placement 10/27: Bed accepted at [facility].  Waiting for insurance Auth

## 2020-12-04 NOTE — Assessment & Plan Note (Signed)
Continue aspirin, statin.  

## 2020-12-04 NOTE — Assessment & Plan Note (Signed)
BMI 15.94 -she has severe protein calorie malnutrition

## 2020-12-04 NOTE — Assessment & Plan Note (Signed)
Continue amlodipine -blood pressure well controlled at this time.  

## 2020-12-05 DIAGNOSIS — Z951 Presence of aortocoronary bypass graft: Secondary | ICD-10-CM

## 2020-12-05 NOTE — Progress Notes (Addendum)
Physical Therapy Treatment Patient Details Name: Madison Brown MRN: [id number] DOB: 1930/01/05 Today's Date: 12/05/2020   History of Present Illness 87 y.o. female who presents with slurred speech and balance difficulties. MRI brain revealed: acute infarction affecting the right posterior parietal lobe, subacute infarction of the left occipital lobe, old infarction of the right occipital lobe, and numerous old small vessel infarctions affecting both cerebellar hemispheres. Pt has past medical history of CAD, HTN, and Hyperlipidemia    PT Comments    Patient is cooperative and eager to walk. Daughter present for most of treatment today. Patient continues to require assistance for ambulation without assistive device and has loss of balance with higher level balance activity including reaching outside base of support, sudden stops, and turning to the right. Patient also continues to require cues for safety with visual field deficits noted with functional activity. Patient is not at her baseline level of functional mobility. SNF is recommended at discharge for ongoing PT efforts in order to maximize independence and facilitate return to prior level of function.    Recommendations for follow up therapy are one component of a multi-disciplinary discharge planning process, led by the attending physician.  Recommendations may be updated based on patient status, additional functional criteria and insurance authorization.  Follow Up Recommendations  Skilled nursing-short term rehab (<3 hours/day)     Assistance Recommended at Discharge Frequent or constant Supervision/Assistance  Equipment Recommendations  Rolling walker (2 wheels);3in1 (PT)    Recommendations for Other Services       Precautions / Restrictions Precautions Precautions: Fall Restrictions Weight Bearing Restrictions: No     Mobility  Bed Mobility               General bed mobility comments: not addressed as patient  sitting up on arrival and post session    Transfers Overall transfer level: Needs assistance   Transfers: Sit to/from Stand Sit to Stand: Min assist           General transfer comment: lifting assistance for standing. verbal cues for technique    Ambulation/Gait Ambulation/Gait assistance: Min assist;Mod assist Gait Distance (Feet): 220 Feet Assistive device: None Gait Pattern/deviations: Step-through pattern Gait velocity: decreased   General Gait Details: patient has loss of balance to the left with turns and sudden stops. patient has difficulty turning to the right and needs constant verbal cues for safety due to gait deficits and visual field impariments. flexed posture, narrow base of support, and decreased step length improved for brief bouts with verbal and tactile cues.   Stairs             Wheelchair Mobility    Modified Rankin (Stroke Patients Only)       Balance Overall balance assessment: Needs assistance Sitting-balance support: Feet supported Sitting balance-Leahy Scale: Good     Standing balance support: No upper extremity supported Standing balance-Leahy Scale: Poor Standing balance comment: without UE support, patient required Min A- Mod A with functional reaching (opening/closing door, reaching for soap while washing hands) to maintain midline balance             High level balance activites: Direction changes;Turns;Sudden stops;Head turns High Level Balance Comments: up to Mod A required, loss of balance with sudden stop and turning to the right            Cognition Arousal/Alertness: Awake/alert Behavior During Therapy: WFL for tasks assessed/performed Overall Cognitive Status: Impaired/Different from baseline Area of Impairment: Safety/judgement;Memory;Attention  Memory: Decreased recall of precautions;Decreased short-term memory   Safety/Judgement: Decreased awareness of safety;Decreased awareness  of deficits   Problem Solving: Slow processing;Decreased initiation;Difficulty sequencing;Requires verbal cues;Requires tactile cues General Comments: patient is able to follow single step commands with extra time        Exercises      General Comments        Pertinent Vitals/Pain Pain Assessment: No/denies pain    Home Living                          Prior Function            PT Goals (current goals can now be found in the care plan section) Acute Rehab PT Goals Patient Stated Goal: to go home PT Goal Formulation: With patient Time For Goal Achievement: 12/10/20 Potential to Achieve Goals: Good Progress towards PT goals: Progressing toward goals    Frequency    7X/week      PT Plan Current plan remains appropriate    Co-evaluation              AM-PAC PT "6 Clicks" Mobility   Outcome Measure  Help needed turning from your back to your side while in a flat bed without using bedrails?: None Help needed moving from lying on your back to sitting on the side of a flat bed without using bedrails?: A Little Help needed moving to and from a bed to a chair (including a wheelchair)?: A Little Help needed standing up from a chair using your arms (e.g., wheelchair or bedside chair)?: A Little Help needed to walk in hospital room?: A Lot Help needed climbing 3-5 steps with a railing? : A Little 6 Click Score: 18    End of Session Equipment Utilized During Treatment: Gait belt Activity Tolerance: Patient tolerated treatment well Patient left: in chair;with call bell/phone within reach;with chair alarm set Nurse Communication: Mobility status PT Visit Diagnosis: Hemiplegia and hemiparesis;Muscle weakness (generalized) (M62.81) Hemiplegia - Right/Left: Right Hemiplegia - dominant/non-dominant: Dominant Hemiplegia - caused by: Cerebral infarction     Time: 3267-1245 PT Time Calculation (min) (ACUTE ONLY): 32 min  Charges:  $Gait Training: 8-22  mins $Neuromuscular Re-education: 8-22 mins                    Donna Bernard, PT, MPT    Ina Homes 12/05/2020, 12:37 PM

## 2020-12-05 NOTE — TOC Progression Note (Signed)
Transition of Care [facility]) - Progression Note    Patient Details  Name: Madison Brown MRN: [id number] Date of Birth: [birth year]  Transition of Care [facility]) CM/SW Contact  Caryn Section, RN Phone Number: 12/05/2020, 11:09 AM  Clinical Narrative:   Delayed entry from 10/26:  Patient's family accepted [facility].  Verlon Au at [facility] will start insurance auth with [employer].  Updated 10/27 at 11:10am:  As per Verlon Au, waiting to obtain auth, no response from [facility] at this time.  TOC to follow.     Expected Discharge Plan: IP Rehab Facility (CIR recommended and will evaluate)    Expected Discharge Plan and Services Expected Discharge Plan: IP Rehab Facility (CIR recommended and will evaluate)     Post Acute Care Choice:  (TBD)                                         Social Determinants of Health (SDOH) Interventions    Readmission Risk Interventions No flowsheet data found.

## 2020-12-05 NOTE — Progress Notes (Signed)
Occupational Therapy Treatment Patient Details Name: Madison Brown MRN: [id number] DOB: October 29, 1929 Today's Date: 12/05/2020   History of present illness 87 y.o. female who presents with slurred speech and balance difficulties. MRI brain revealed: acute infarction affecting the right posterior parietal lobe, subacute infarction of the left occipital lobe, old infarction of the right occipital lobe, and numerous old small vessel infarctions affecting both cerebellar hemispheres. Pt has past medical history of CAD, HTN, and Hyperlipidemia   OT comments  Ms. Madison Brown seen for OT treatment on this date. Upon arrival to room pt awake/alert. Pt seated upright in chair. Pt agreeable to tx. Pt instructed in visual scanning techniques but unable to incorporate in session. Pt requiring MIN A for functional t/f, ambulating ~10 ft w/ multiple LOB which she was unable to correct. CGA for UB grooming standing sinkside ~12 minutes for hair brushing, tooth brushing, VCs for visual scanning, safety awareness, sequencing. Pt making good progress toward goals. Pt continues to benefit from skilled OT services to maximize return to PLOF and minimize risk of future falls, injury, caregiver burden, and readmission. Will continue to follow POC. Discharge recommendation remains appropriate.     Recommendations for follow up therapy are one component of a multi-disciplinary discharge planning process, led by the attending physician.  Recommendations may be updated based on patient status, additional functional criteria and insurance authorization.    Follow Up Recommendations  Skilled nursing-short term rehab (<3 hours/day)    Assistance Recommended at Discharge Frequent or constant Supervision/Assistance  Equipment Recommendations  BSC;Tub/shower Brown    Recommendations for Other Services      Precautions / Restrictions Precautions Precautions: Fall Precaution Comments: high fall risk Restrictions Weight Bearing  Restrictions: No       Mobility Bed Mobility               General bed mobility comments: bed received/left seated in chair    Transfers Overall transfer level: Needs assistance   Transfers: Sit to/from Stand Sit to Stand: Min assist                 Balance Overall balance assessment: Needs assistance Sitting-balance support: Feet supported Sitting balance-Leahy Scale: Good     Standing balance support: No upper extremity supported Standing balance-Leahy Scale: Fair                             ADL either performed or assessed with clinical judgement   ADL Overall ADL's : Needs assistance/impaired                                       General ADL Comments: Pt requiring MIN A for functional t/f, ambulating ~10 ft w/ multiple LOB which she was unable to correct. CGA for UB grooming standing sinkside ~12 minutes for hair brushing, tooth brushing, VCs for visual scanning, safety awareness, sequencing. Pt is MIN A for physical assist, but MAX Vcs for scanning, safety, memory, and sequencing.       Cognition Arousal/Alertness: Awake/alert Behavior During Therapy: WFL for tasks assessed/performed Overall Cognitive Status: Impaired/Different from baseline Area of Impairment: Safety/judgement;Memory;Attention                     Memory: Decreased recall of precautions;Decreased short-term memory   Safety/Judgement: Decreased awareness of safety;Decreased awareness of deficits   Problem Solving: Slow processing;Requires  verbal cues;Decreased initiation            Exercises Exercises: Other exercises Other Exercises Other Exercises: Pt educ re: OT role, visual scanning techniques- lighthouse analogy, falls pcns Other Exercises: Pt sit<>stand, sinkside hair brushing, toothbrushing for visual scan. Standing task with visual scan "find the ...". Ambulatory task with visual scan "find the ...".   Shoulder Instructions        General Comments      Pertinent Vitals/ Pain       Pain Assessment: No/denies pain  Home Living                                          Prior Functioning/Environment              Frequency  Min 2X/week        Progress Toward Goals  OT Goals(current goals can now be found in the care plan section)  Progress towards OT goals: Progressing toward goals  Acute Rehab OT Goals OT Goal Formulation: With patient Time For Goal Achievement: 12/10/20 ADL Goals Pt Will Perform Upper Body Bathing: with supervision;with set-up;sitting Pt Will Perform Lower Body Dressing: with min assist;sit to/from stand Pt Will Transfer to Toilet: with min assist;stand pivot transfer;bedside commode  Plan Discharge plan remains appropriate    Co-evaluation                 AM-PAC OT "6 Clicks" Daily Activity     Outcome Measure   Help from another person eating meals?: None Help from another person taking care of personal grooming?: A Little Help from another person toileting, which includes using toliet, bedpan, or urinal?: A Little Help from another person bathing (including washing, rinsing, drying)?: A Little Help from another person to put on and taking off regular upper body clothing?: A Little Help from another person to put on and taking off regular lower body clothing?: A Little 6 Click Score: 19    End of Session Equipment Utilized During Treatment: Gait belt  OT Visit Diagnosis: Unsteadiness on feet (R26.81);History of falling (Z91.81)   Activity Tolerance Patient tolerated treatment well   Patient Left in chair;with call bell/phone within reach;with chair alarm set   Nurse Communication          Time: 1510-1535 OT Time Calculation (min): 25 min  Charges: OT General Charges $OT Visit: 1 Visit OT Treatments $Self Care/Home Management : 23-37 mins  [place] [facility], Adine Madura 12/05/2020, 4:22 PM

## 2020-12-05 NOTE — Assessment & Plan Note (Signed)
-   now resolved

## 2020-12-05 NOTE — Assessment & Plan Note (Signed)
Continue amlodipine -blood pressure well controlled at this time.

## 2020-12-05 NOTE — Assessment & Plan Note (Signed)
At baseline 

## 2020-12-05 NOTE — Assessment & Plan Note (Signed)
MRI showing acute right posterior parietal lobe and subacute left occipital stroke.  Neurology recommends baby aspirin for 90 days, continue Plavix and statin. PT/OT recommends SNF.  TOC working on it.  Patient symptoms resolved.

## 2020-12-05 NOTE — Assessment & Plan Note (Signed)
BMI 15.94.  Encouraged oral nutrition

## 2020-12-05 NOTE — Progress Notes (Signed)
  Progress Note    Madison Brown   PJA:[id number]  DOB: 1930/02/01  DOA: 11/25/2020     10 Date of Service: 12/05/2020   Clinical Course  Madison Brown is a 87 y.o. female seen in ed with complaints of Slurred speech and off balance  today morning. Last night pt went to bed at baseline and about 4 am she was not herself and had slurred speech and off balance. Pt reported to be leaning to the right side. Both daughter at bedside. Pt lives with spouse and at about 4 am when she got up to use restroom she was weak on right side and  knocked things down  by leaning on them  due to lack of balance.    Patient was found to have a subacute left occipital lobe stroke, an old right occipital and acute right posterior parietal stroke.  Neurology consulted.   Patient incidentally tested positive for Covid-19, without any complaints of respiratory symptoms.    Started on antibiotic for Klebsiella UTI.  10/26 -PT/OT recommendations updated to SNF.  TOC working on placement 10/27: Bed accepted at [facility].  Waiting for insurance Auth   Assessment and Plan * Acute CVA (cerebrovascular accident) [facility]) MRI showing acute right posterior parietal lobe and subacute left occipital stroke.  Neurology recommends baby aspirin for 90 days, continue Plavix and statin. PT/OT recommends SNF.  TOC working on it.  Patient symptoms resolved.  Protein-calorie malnutrition, severe (HCC) BMI 15.94.  Encouraged oral nutrition  Dementia (HCC) At baseline  Slurred speech  now resolved  COVID-19 virus infection Incidental finding on admission.  Patient is asymptomatic.  No treatment needed  Essential hypertension, benign Continue amlodipine -blood pressure well controlled at this time.   CAD (coronary artery disease) Continue aspirin, statin.     Subjective:  Patient requesting to go home.  Daughter-in-law and son at bedside  Objective Vitals:   12/04/20 2300 12/05/20 0434 12/05/20 0810  12/05/20 1159  BP: 124/64 128/64 (!) 152/86 108/61  Pulse: 75 75 81 80  Resp: 16 16 17 17   Temp: 98.1 F (36.7 C) 98.2 F (36.8 C) 98.3 F (36.8 C) 98.3 F (36.8 C)  TempSrc: Oral Oral    SpO2: 98% 97% 99% 97%  Weight:      Height:       40.8 kg  Vital signs were reviewed and unremarkable.   Exam Physical Exam   General exam:Awake sitting in recliner, no acute distress, frail, underweight Respiratory system:Lungs clear bilaterally, normal respiratory effort, on room air Cardiovascular system:Regular rate and rhythm, no peripheral edema Central nervous system:Normal speech, A&O x3, grossly nonfocal exam Psychiatry:normal mood, congruent affect  Labs / Other Information My review of labs, imaging, notes and other tests shows no new significant findings.    Disposition Plan: Status is: Inpatient  Remains inpatient appropriate because: Waiting for SNF placement    Time spent: 25 minutes [facility] 12/05/2020, 1:40 PM

## 2020-12-05 NOTE — Assessment & Plan Note (Signed)
Continue aspirin, statin.  

## 2020-12-05 NOTE — Assessment & Plan Note (Signed)
Incidental finding on admission.  Patient is asymptomatic.  No treatment needed 

## 2020-12-06 DIAGNOSIS — R4781 Slurred speech: Secondary | ICD-10-CM | POA: Diagnosis not present

## 2020-12-06 DIAGNOSIS — Z8673 Personal history of transient ischemic attack (TIA), and cerebral infarction without residual deficits: Secondary | ICD-10-CM | POA: Diagnosis not present

## 2020-12-06 DIAGNOSIS — N390 Urinary tract infection, site not specified: Secondary | ICD-10-CM | POA: Diagnosis not present

## 2020-12-06 DIAGNOSIS — I779 Disorder of arteries and arterioles, unspecified: Secondary | ICD-10-CM | POA: Diagnosis not present

## 2020-12-06 DIAGNOSIS — I69398 Other sequelae of cerebral infarction: Secondary | ICD-10-CM | POA: Diagnosis not present

## 2020-12-06 DIAGNOSIS — I69312 Visuospatial deficit and spatial neglect following cerebral infarction: Secondary | ICD-10-CM | POA: Diagnosis not present

## 2020-12-06 DIAGNOSIS — F0390 Unspecified dementia without behavioral disturbance: Secondary | ICD-10-CM | POA: Diagnosis not present

## 2020-12-06 DIAGNOSIS — B961 Klebsiella pneumoniae [K. pneumoniae] as the cause of diseases classified elsewhere: Secondary | ICD-10-CM | POA: Diagnosis not present

## 2020-12-06 DIAGNOSIS — Z8616 Personal history of COVID-19: Secondary | ICD-10-CM | POA: Diagnosis not present

## 2020-12-06 DIAGNOSIS — I2510 Atherosclerotic heart disease of native coronary artery without angina pectoris: Secondary | ICD-10-CM | POA: Diagnosis not present

## 2020-12-06 DIAGNOSIS — Z951 Presence of aortocoronary bypass graft: Secondary | ICD-10-CM | POA: Diagnosis not present

## 2020-12-06 DIAGNOSIS — R1312 Dysphagia, oropharyngeal phase: Secondary | ICD-10-CM | POA: Diagnosis not present

## 2020-12-06 DIAGNOSIS — I10 Essential (primary) hypertension: Secondary | ICD-10-CM | POA: Diagnosis not present

## 2020-12-06 DIAGNOSIS — R410 Disorientation, unspecified: Secondary | ICD-10-CM | POA: Diagnosis not present

## 2020-12-06 DIAGNOSIS — E7800 Pure hypercholesterolemia, unspecified: Secondary | ICD-10-CM | POA: Diagnosis not present

## 2020-12-06 DIAGNOSIS — E43 Unspecified severe protein-calorie malnutrition: Secondary | ICD-10-CM | POA: Diagnosis not present

## 2020-12-06 DIAGNOSIS — Z743 Need for continuous supervision: Secondary | ICD-10-CM | POA: Diagnosis not present

## 2020-12-06 DIAGNOSIS — Z953 Presence of xenogenic heart valve: Secondary | ICD-10-CM | POA: Diagnosis not present

## 2020-12-06 DIAGNOSIS — I69391 Dysphagia following cerebral infarction: Secondary | ICD-10-CM | POA: Diagnosis not present

## 2020-12-06 DIAGNOSIS — Z7982 Long term (current) use of aspirin: Secondary | ICD-10-CM | POA: Diagnosis not present

## 2020-12-06 DIAGNOSIS — I639 Cerebral infarction, unspecified: Secondary | ICD-10-CM | POA: Diagnosis not present

## 2020-12-06 MED ORDER — ENSURE ENLIVE PO LIQD: 237.0000 mL | Freq: Two times a day (BID) | ORAL | 12 refills | Status: AC

## 2020-12-06 MED ORDER — ASPIRIN 81 MG PO CHEW: 81.0000 mg | CHEWABLE_TABLET | Freq: Every day | ORAL | 0 refills | Status: AC

## 2020-12-06 NOTE — Progress Notes (Signed)
Called report over to [facility] to [place]. Patient is resting in bedside chair with chair alarm in place. Call bell in reach. Took patient off COVID precautions due to isolation period being over. Removed IV to left forearm/AC area.

## 2020-12-06 NOTE — Care Management Important Message (Signed)
Important Message  Patient Details  Name: Madison Brown MRN: [id number] Date of Birth: 07/19/1929   Medicare Important Message Given:  Yes     Olegario Messier A Drucilla Cumber 12/06/2020, 11:12 AM

## 2020-12-06 NOTE — Assessment & Plan Note (Signed)
treated

## 2020-12-06 NOTE — Assessment & Plan Note (Signed)
Continue aspirin, statin.  

## 2020-12-06 NOTE — Assessment & Plan Note (Signed)
At baseline, outpt Palliative care eval

## 2020-12-06 NOTE — TOC Transition Note (Signed)
Transition of Care [facility]) - CM/SW Discharge Note   Patient Details  Name: Madison Brown MRN: [id number] Date of Birth: 1929-10-04  Transition of Care [facility]) CM/SW Contact:  Caryn Section, RN Phone Number: 12/06/2020, 10:28 AM   Clinical Narrative:   Patient can transfer to [facility] room 507 as per Verlon Au today via EMS Patient and family aware.            Patient Goals and CMS Choice        Discharge Placement                       Discharge Plan and Services     Post Acute Care Choice:  (TBD)                               Social Determinants of Health (SDOH) Interventions     Readmission Risk Interventions No flowsheet data found.

## 2020-12-06 NOTE — Assessment & Plan Note (Signed)
Continue amlodipine -blood pressure well controlled at this time.  

## 2020-12-06 NOTE — Discharge Summary (Signed)
Physician Discharge Summary   Patient name: Madison Brown  Admit date:     11/25/2020  Discharge date: 12/06/2020  Discharge Physician: Delfino Lovett   PCP: Enid Baas, MD   Recommendations at discharge: f/up with PCP, neuro and cardio as requested  Discharge Diagnoses Principal Problem:   Acute CVA (cerebrovascular accident) [facility]) Active Problems:   CAD (coronary artery disease)   Essential hypertension, benign   COVID-19 virus infection   Slurred speech   Hx of CABG   S/P aortic valve replacement with bioprosthetic valve   Dementia (HCC)   Protein-calorie malnutrition, severe (HCC)   Urinary tract infection without hematuria   Hospital Course   Madison Brown is a 87 y.o. female seen in ed with complaints of Slurred speech and off balance  today morning. Last night pt went to bed at baseline and about 4 am she was not herself and had slurred speech and off balance. Pt reported to be leaning to the right side. Both daughter at bedside. Pt lives with spouse and at about 4 am when she got up to use restroom she was weak on right side and  knocked things down  by leaning on them  due to lack of balance.    Patient was found to have a subacute left occipital lobe stroke, an old right occipital and acute right posterior parietal stroke.  Neurology consulted.   Patient incidentally tested positive for Covid-19, without any complaints of respiratory symptoms.    Started on antibiotic for Klebsiella UTI.  10/26 -PT/OT recommendations updated to SNF.  TOC working on placement 10/27: Bed accepted at [facility].  Waiting for insurance Auth   * Acute CVA (cerebrovascular accident) [facility]) MRI showing acute right posterior parietal lobe and subacute left occipital stroke.  Neurology recommends baby aspirin for 90 days, continue Plavix and statin. PT/OT recommends SNF and patient has bed at [facility]   Urinary tract infection without  hematuria treated  Protein-calorie malnutrition, severe (HCC) BMI 15.94.  Encouraged oral nutrition, recommend palliative care eval as an outpt  Dementia (HCC) At baseline, outpt Palliative care eval  COVID-19 virus infection Incidental finding on admission.  Patient is asymptomatic.  No treatment needed  Essential hypertension, benign Continue amlodipine -blood pressure well controlled at this time.   CAD (coronary artery disease) Continue aspirin, statin.     Procedures performed: none   Condition at discharge: good  Exam Physical Exam   General exam: Awake sitting in recliner, no acute distress, frail, underweight Respiratory system: Lungs clear bilaterally, normal respiratory effort, on room air Cardiovascular system: Regular rate and rhythm, no peripheral edema Central nervous system: Normal speech, A&O x3, grossly nonfocal exam Psychiatry: normal mood, congruent affect  Disposition: Skilled nursing facility  Discharge time: greater than 30 minutes.  Contact information for follow-up providers     Enid Baas, MD. Schedule an appointment as soon as possible for a visit in 1 week(s).   Specialty: Internal Medicine Why: [facility] Discharge F/UP Contact information: [address] [place] [address] [phone]              Contact information for after-discharge care     Destination     [facility] Preferred SNF .   Service: Skilled Paramedic information: [address] [place] [address] [phone]  Allergies as of 12/06/2020   No Known Allergies      Medication List     STOP taking these medications    pravastatin 20 MG tablet Commonly known as: PRAVACHOL       TAKE these medications    amLODipine 10 MG tablet Commonly known as: NORVASC Take 10 mg by mouth daily.   aspirin 81  MG chewable tablet Chew 1 tablet (81 mg total) by mouth daily. Stop aspirine after 90 days and stay on plavix only after that Start taking on: December 07, 2020   atorvastatin 40 MG tablet Commonly known as: LIPITOR Take 40 mg by mouth daily.   clopidogrel 75 MG tablet Commonly known as: PLAVIX Take 75 mg by mouth daily.   feeding supplement Liqd Take 237 mLs by mouth 2 (two) times daily between meals.        CT ANGIO HEAD NECK W WO CM  Result Date: 11/25/2020 CLINICAL DATA:  Neuro deficit, acute, stroke suspected; right sided deficits EXAM: CT ANGIOGRAPHY HEAD AND NECK TECHNIQUE: Multidetector CT imaging of the head and neck was performed using the standard protocol during bolus administration of intravenous contrast. Multiplanar CT image reconstructions and MIPs were obtained to evaluate the vascular anatomy. Carotid stenosis measurements (when applicable) are obtained utilizing NASCET criteria, using the distal internal carotid diameter as the denominator. CONTRAST:  60mL OMNIPAQUE IOHEXOL 350 MG/ML SOLN COMPARISON:  None. FINDINGS: CTA NECK Aortic arch: Calcified and irregular noncalcified plaque along the arch. Patent great vessel origins. Plaque without high-grade proximal subclavian stenosis. Right carotid system: Patent. Calcified plaque along the proximal internal carotid with less than 50% stenosis. Left carotid system: Patent. Mixed but primarily calcified plaque along the proximal internal carotid with less than 50% stenosis. Vertebral arteries: Patent.  Right vertebral artery is dominant. Skeleton: Degenerative changes of the cervical spine. Degenerative changes of the temporomandibular joints. Other neck: Unremarkable. Upper chest: Included upper lungs are clear. Review of the MIP images confirms the above findings CTA HEAD Anterior circulation: Intracranial internal carotid arteries are patent with calcified plaque but no significant stenosis. Anterior and middle cerebral arteries  are patent. There is mild to moderate stenoses of the proximal right M1 MCA and distal left M1 MCA. Moderate stenosis of the distal right M1 MCA. Additional mild M2 MCA stenoses. Posterior circulation: Intracranial vertebral arteries are patent. Basilar artery is patent. Moderate to marked stenosis of the mid basilar. Major cerebellar artery origins are patent. Bilateral posterior communicating arteries are present. Posterior cerebral arteries are patent. There is moderate stenosis of the left P2 PCA. Diminished visualization of distal PCA branches related to infarcts. Venous sinuses: Patent as allowed by contrast bolus timing. Review of the MIP images confirms the above findings IMPRESSION: No large vessel occlusion. Plaque at the proximal internal carotids causes less than 50% stenosis. Multifocal intracranial atherosclerosis. Electronically Signed   By: Guadlupe Spanish M.D.   On: 11/25/2020 16:39   DG Chest 2 View  Result Date: 11/25/2020 CLINICAL DATA:  Fever, weakness EXAM: CHEST - 2 VIEW COMPARISON:  09/22/2010 FINDINGS: There is hyperinflation of the lungs compatible with COPD. Prior CABG. Heart and mediastinal contours are within normal limits. No focal opacities or effusions. No acute bony abnormality. IMPRESSION: COPD.  No active disease. Electronically Signed   By: Charlett Nose M.D.   On: 11/25/2020 17:32   CT HEAD WO CONTRAST  Result Date: 11/25/2020 CLINICAL DATA:  Possible stroke EXAM: CT HEAD WITHOUT CONTRAST TECHNIQUE: Contiguous axial images were obtained  from the base of the skull through the vertex without intravenous contrast. COMPARISON:  Brain MRI 09/23/2010, CT head 09/04/2020 FINDINGS: Brain: There is hypodensity in the left PCA territory consistent with evolving early chronic infarct, as seen on CT head of 09/04/2020. Small foci of hyperdensity within the infarct territory may reflect cortical laminar necrosis. An additional remote infarct in the right parietal lobe is unchanged.  There is no evidence of new acute infarct. There is no acute intracranial hemorrhage or extra-axial fluid collection. There is unchanged global parenchymal volume loss. Hypodensity throughout the remainder of the subcortical and periventricular white matter likely reflects sequela of chronic white matter microangiopathy. There is no mass lesion.  There is no midline shift. Vascular: There is calcification of the bilateral cavernous ICAs. Skull: Normal. Negative for fracture or focal lesion. Sinuses/Orbits: Bilateral lens implants are in place. The globes and orbits are otherwise unremarkable. Other: A mildly hyperdense lesion in the left parotid gland is incompletely imaged but was present in 2012 and may reflect a lymph node. IMPRESSION: 1. No acute intracranial hemorrhage or infarct. 2. Expected interval evolution of the left occipital lobe infarct since 09/04/2020. 3. Unchanged remote right occipital infarct, global parenchymal volume loss, and chronic white matter microangiopathy. Electronically Signed   By: Lesia Hausen M.D.   On: 11/25/2020 12:48   MR BRAIN WO CONTRAST  Result Date: 11/25/2020 CLINICAL DATA:  Neuro deficit, acute, stroke suspected. Right-sided deficits. EXAM: MRI HEAD WITHOUT CONTRAST TECHNIQUE: Multiplanar, multiecho pulse sequences of the brain and surrounding structures were obtained without intravenous contrast. COMPARISON:  CT studies earlier same day.  CT 09/04/2020 FINDINGS: Brain: Mild chronic small-vessel ischemic change affects the pons. There are numerous old small vessel cerebellar infarctions affecting both hemispheres. Late subacute infarction of the left occipital lobe with evolutionary changes. No evidence of extension of the insult. There is been old infarction in the right occipital lobe. Punctate acute infarction in the right parietal cortical and subcortical brain without mass effect or hemorrhage. No evidence of acute hemorrhage, hydrocephalus or extra-axial fluid  collection. Minimal petechial blood products in the old and subacute occipital infarctions. Chronic small-vessel ischemic change of the white matter, some of the insults being associated with petechial blood products. Vascular: Major vessels at the base of the brain show flow. Skull and upper cervical spine: Negative Sinuses/Orbits: Clear/normal Other: None IMPRESSION: Late subacute infarction of the left occipital lobe. Old infarction of the right occipital lobe. Acute subcentimeter infarction affecting the right posterior parietal lobe. Numerous old small vessel infarctions affecting both cerebellar hemispheres. Chronic small-vessel ischemic changes of the pons and cerebral hemispheric white matter. Electronically Signed   By: Paulina Fusi M.D.   On: 11/25/2020 18:12   ECHOCARDIOGRAM COMPLETE  Result Date: 11/26/2020    ECHOCARDIOGRAM REPORT   Patient Name:   JUDYE LORINO Date of Exam: 11/26/2020 Medical Rec #:  [id number]      Height:       63.0 in Accession #:    [id number]     Weight:       90.0 lb Date of Birth:  1929-06-11      BSA:          1.377 m Patient Age:    87 years       BP:           143/74 mmHg Patient Gender: F              HR:  87 bpm. Exam Location:  [facility] Procedure: Cardiac Doppler, Color Doppler and 2D Echo Indications:     Stroke I63.9  History:         Patient has no prior history of Echocardiogram examinations.                  CAD.  Sonographer:     Cristela Blue Referring Phys:  [id number] Eliezer Mccoy PATEL Diagnosing Phys: Yvonne Kendall MD  Sonographer Comments: Suboptimal apical window. IMPRESSIONS  1. Left ventricular ejection fraction, by estimation, is 60 to 65%. The left ventricle has normal function. Left ventricular endocardial border not optimally defined to evaluate regional wall motion. There is moderate left ventricular hypertrophy. Left ventricular diastolic parameters are indeterminate.  2. Right ventricular systolic function is normal. The right ventricular size is  normal.  3. The mitral valve is normal in structure. No evidence of mitral valve regurgitation. There is borderline mild mitral stenosis. The mean mitral valve gradient is 4.0 mmHg.  4. The aortic valve was not well visualized. Aortic valve regurgitation is not visualized. Mild aortic valve sclerosis is present, with no evidence of aortic valve stenosis. FINDINGS  Left Ventricle: Left ventricular ejection fraction, by estimation, is 60 to 65%. The left ventricle has normal function. Left ventricular endocardial border not optimally defined to evaluate regional wall motion. The left ventricular internal cavity size was normal in size. There is moderate left ventricular hypertrophy. Left ventricular diastolic parameters are indeterminate. Right Ventricle: The right ventricular size is normal. No increase in right ventricular wall thickness. Right ventricular systolic function is normal. Left Atrium: Left atrial size was normal in size. Right Atrium: Right atrial size was normal in size. Pericardium: The pericardium was not well visualized. Mitral Valve: The mitral valve is normal in structure. No evidence of mitral valve regurgitation. There is borderline mild mitral valve stenosis. MV peak gradient, 12.8 mmHg. The mean mitral valve gradient is 4.0 mmHg. Tricuspid Valve: The tricuspid valve is normal in structure. Tricuspid valve regurgitation is mild. Aortic Valve: The aortic valve was not well visualized. Aortic valve regurgitation is not visualized. Mild aortic valve sclerosis is present, with no evidence of aortic valve stenosis. Aortic valve mean gradient measures 6.3 mmHg. Aortic valve peak gradient measures 11.7 mmHg. Aortic valve area, by VTI measures 2.18 cm. Pulmonic Valve: The pulmonic valve was not well visualized. Pulmonic valve regurgitation is not visualized. No evidence of pulmonic stenosis. Aorta: The aortic root is normal in size and structure. Pulmonary Artery: The pulmonary artery is not well  seen. Venous: The inferior vena cava was not well visualized. IAS/Shunts: The interatrial septum was not well visualized.  LEFT VENTRICLE PLAX 2D LVIDd:         3.10 cm   Diastology LVIDs:         1.90 cm   LV e' medial:    12.10 cm/s LV PW:         1.30 cm   LV E/e' medial:  11.4 LV IVS:        1.50 cm   LV e' lateral:   8.92 cm/s LVOT diam:     2.00 cm   LV E/e' lateral: 15.5 LV SV:         58 LV SV Index:   42 LVOT Area:     3.14 cm  RIGHT VENTRICLE RV Basal diam:  2.70 cm RV S prime:     13.20 cm/s TAPSE (M-mode): 2.8 cm LEFT ATRIUM  Index        RIGHT ATRIUM          Index LA diam:        3.90 cm 2.83 cm/m   RA Area:     9.99 cm LA Vol (A2C):   39.1 ml 28.39 ml/m  RA Volume:   19.80 ml 14.38 ml/m LA Vol (A4C):   29.9 ml 21.71 ml/m LA Biplane Vol: 33.7 ml 24.47 ml/m  AORTIC VALVE                     PULMONIC VALVE AV Area (Vmax):    1.74 cm      PV Vmax:        1.09 m/s AV Area (Vmean):   1.68 cm      PV Peak grad:   4.8 mmHg AV Area (VTI):     2.18 cm      RVOT Peak grad: 8 mmHg AV Vmax:           171.00 cm/s AV Vmean:          117.667 cm/s AV VTI:            0.265 m AV Peak Grad:      11.7 mmHg AV Mean Grad:      6.3 mmHg LVOT Vmax:         94.60 cm/s LVOT Vmean:        62.800 cm/s LVOT VTI:          0.184 m LVOT/AV VTI ratio: 0.70  AORTA Ao Root diam: 2.30 cm MITRAL VALVE                TRICUSPID VALVE MV Area (PHT): 6.83 cm     TR Peak grad:   22.8 mmHg MV Area VTI:   2.66 cm     TR Vmax:        239.00 cm/s MV Peak grad:  12.8 mmHg MV Mean grad:  4.0 mmHg     SHUNTS MV Vmax:       1.79 m/s     Systemic VTI:  0.18 m MV Vmean:      84.7 cm/s    Systemic Diam: 2.00 cm MV Decel Time: 111 msec MV E velocity: 138.00 cm/s MV A velocity: 55.30 cm/s MV E/A ratio:  2.50 Cristal Deer End MD Electronically signed by Yvonne Kendall MD Signature Date/Time: 11/26/2020/1:58:30 PM    Final    Results for orders placed or performed during the hospital encounter of 11/25/20  Resp Panel by RT-PCR (Flu  A&B, Covid) Urine, Clean Catch     Status: Abnormal   Collection Time: 11/25/20  5:29 PM   Specimen: Urine, Clean Catch; Nasopharyngeal(NP) swabs in vial transport medium  Result Value Ref Range Status   SARS Coronavirus 2 by RT PCR POSITIVE (A) NEGATIVE Final    Comment: RESULT CALLED TO, READ BACK BY AND VERIFIED WITH: MATT BASSETT  ON 11/25/20 SKL (NOTE) SARS-CoV-2 target nucleic acids are DETECTED.  The SARS-CoV-2 RNA is generally detectable in upper respiratory specimens during the acute phase of infection. Positive results are indicative of the presence of the identified virus, but do not rule out bacterial infection or co-infection with other pathogens not detected by the test. Clinical correlation with patient history and other diagnostic information is necessary to determine patient infection status. The expected result is Negative.  Fact Sheet for Patients: BloggerCourse.com  Fact Sheet for Healthcare Providers: SeriousBroker.it  This test is not  yet approved or cleared by the [place] and  has been authorized for detection and/or diagnosis of SARS-CoV-2 by FDA under an Emergency Use Authorization (EUA).  This EUA will remain in effect (meaning this test can b e used) for the duration of  the COVID-19 declaration under Section 564(b)(1) of the Act, 21 U.S.C. section 360bbb-3(b)(1), unless the authorization is terminated or revoked sooner.     Influenza A by PCR NEGATIVE NEGATIVE Final   Influenza B by PCR NEGATIVE NEGATIVE Final    Comment: (NOTE) The Xpert Xpress SARS-CoV-2/FLU/RSV plus assay is intended as an aid in the diagnosis of influenza from Nasopharyngeal swab specimens and should not be used as a sole basis for treatment. Nasal washings and aspirates are unacceptable for Xpert Xpress SARS-CoV-2/FLU/RSV testing.  Fact Sheet for Patients: BloggerCourse.com  Fact Sheet  for Healthcare Providers: SeriousBroker.it  This test is not yet approved or cleared by the [place] FDA and has been authorized for detection and/or diagnosis of SARS-CoV-2 by FDA under an Emergency Use Authorization (EUA). This EUA will remain in effect (meaning this test can be used) for the duration of the COVID-19 declaration under Section 564(b)(1) of the Act, 21 U.S.C. section 360bbb-3(b)(1), unless the authorization is terminated or revoked.  Performed at [facility], [address] [place], [place] [address]   Urine Culture     Status: Abnormal   Collection Time: 11/25/20  5:29 PM   Specimen: Urine, Random  Result Value Ref Range Status   Specimen Description   Final    URINE, RANDOM Performed at [facility], [address] [place], [place] [address]    Special Requests   Final    NONE Performed at [facility], [address] [place], [place] [address]    Culture >=100,000 COLONIES/mL KLEBSIELLA PNEUMONIAE (A)  Final   Report Status 11/27/2020 FINAL  Final   Organism ID, Bacteria KLEBSIELLA PNEUMONIAE (A)  Final      Susceptibility   Klebsiella pneumoniae - MIC*    AMPICILLIN RESISTANT Resistant     CEFAZOLIN <=4 SENSITIVE Sensitive     CEFEPIME <=0.12 SENSITIVE Sensitive     CEFTRIAXONE <=0.25 SENSITIVE Sensitive     CIPROFLOXACIN <=0.25 SENSITIVE Sensitive     GENTAMICIN <=1 SENSITIVE Sensitive     IMIPENEM <=0.25 SENSITIVE Sensitive     NITROFURANTOIN <=16 SENSITIVE Sensitive     TRIMETH/SULFA <=20 SENSITIVE Sensitive     AMPICILLIN/SULBACTAM <=2 SENSITIVE Sensitive     PIP/TAZO <=4 SENSITIVE Sensitive     * >=100,000 COLONIES/mL KLEBSIELLA PNEUMONIAE  Blood culture (routine x 2)     Status: None   Collection Time: 11/25/20  7:00 PM   Specimen: BLOOD  Result Value Ref Range Status   Specimen Description BLOOD BLOOD RIGHT FOREARM  Final   Special Requests   Final    BOTTLES DRAWN AEROBIC AND  ANAEROBIC Blood Culture results may not be optimal due to an inadequate volume of blood received in culture bottles   Culture   Final    NO GROWTH 7 DAYS Performed at [facility], [address] [place], [place] [address]    Report Status 12/02/2020 FINAL  Final  Culture, blood (Routine X 2) w Reflex to ID Panel     Status: None   Collection Time: 11/26/20 12:06 AM   Specimen: BLOOD  Result Value Ref Range Status   Specimen Description BLOOD LEFT ASSIST CONTROL  Final   Special Requests  Final    BOTTLES DRAWN AEROBIC ONLY Blood Culture adequate volume   Culture   Final    NO GROWTH 5 DAYS Performed at [facility], [address] [place] [place], [place] [address]    Report Status 12/01/2020 FINAL  Final    Signed:  Delfino Lovett MD.  [facility] 12/06/2020, 10:52 AM

## 2020-12-06 NOTE — Assessment & Plan Note (Signed)
MRI showing acute right posterior parietal lobe and subacute left occipital stroke.  Neurology recommends baby aspirin for 90 days, continue Plavix and statin. PT/OT recommends SNF and patient has bed at [facility].

## 2020-12-06 NOTE — Assessment & Plan Note (Signed)
BMI 15.94.  Encouraged oral nutrition, recommend palliative care eval as an outpt

## 2020-12-06 NOTE — Assessment & Plan Note (Signed)
Incidental finding on admission.  Patient is asymptomatic.  No treatment needed

## 2020-12-09 DIAGNOSIS — Z8673 Personal history of transient ischemic attack (TIA), and cerebral infarction without residual deficits: Secondary | ICD-10-CM | POA: Diagnosis not present

## 2020-12-09 DIAGNOSIS — I779 Disorder of arteries and arterioles, unspecified: Secondary | ICD-10-CM | POA: Diagnosis not present

## 2020-12-09 DIAGNOSIS — E43 Unspecified severe protein-calorie malnutrition: Secondary | ICD-10-CM | POA: Diagnosis not present

## 2020-12-27 DIAGNOSIS — I119 Hypertensive heart disease without heart failure: Secondary | ICD-10-CM | POA: Diagnosis not present

## 2020-12-27 DIAGNOSIS — I69398 Other sequelae of cerebral infarction: Secondary | ICD-10-CM | POA: Diagnosis not present

## 2020-12-27 DIAGNOSIS — H5462 Unqualified visual loss, left eye, normal vision right eye: Secondary | ICD-10-CM | POA: Diagnosis not present

## 2020-12-27 DIAGNOSIS — I69391 Dysphagia following cerebral infarction: Secondary | ICD-10-CM | POA: Diagnosis not present

## 2021-01-04 DIAGNOSIS — I119 Hypertensive heart disease without heart failure: Secondary | ICD-10-CM | POA: Diagnosis not present

## 2021-01-04 DIAGNOSIS — E782 Mixed hyperlipidemia: Secondary | ICD-10-CM | POA: Diagnosis not present

## 2021-01-04 DIAGNOSIS — Z9181 History of falling: Secondary | ICD-10-CM | POA: Diagnosis not present

## 2021-01-04 DIAGNOSIS — E43 Unspecified severe protein-calorie malnutrition: Secondary | ICD-10-CM | POA: Diagnosis not present

## 2021-01-04 DIAGNOSIS — H5462 Unqualified visual loss, left eye, normal vision right eye: Secondary | ICD-10-CM | POA: Diagnosis not present

## 2021-01-04 DIAGNOSIS — F0390 Unspecified dementia without behavioral disturbance: Secondary | ICD-10-CM | POA: Diagnosis not present

## 2021-01-04 DIAGNOSIS — I2510 Atherosclerotic heart disease of native coronary artery without angina pectoris: Secondary | ICD-10-CM | POA: Diagnosis not present

## 2021-01-04 DIAGNOSIS — I69391 Dysphagia following cerebral infarction: Secondary | ICD-10-CM | POA: Diagnosis not present

## 2021-01-04 DIAGNOSIS — Z7982 Long term (current) use of aspirin: Secondary | ICD-10-CM | POA: Diagnosis not present

## 2021-01-04 DIAGNOSIS — I083 Combined rheumatic disorders of mitral, aortic and tricuspid valves: Secondary | ICD-10-CM | POA: Diagnosis not present

## 2021-01-04 DIAGNOSIS — Z87440 Personal history of urinary (tract) infections: Secondary | ICD-10-CM | POA: Diagnosis not present

## 2021-01-04 DIAGNOSIS — Z7902 Long term (current) use of antithrombotics/antiplatelets: Secondary | ICD-10-CM | POA: Diagnosis not present

## 2021-01-04 DIAGNOSIS — I69398 Other sequelae of cerebral infarction: Secondary | ICD-10-CM | POA: Diagnosis not present

## 2021-01-04 DIAGNOSIS — Z8616 Personal history of COVID-19: Secondary | ICD-10-CM | POA: Diagnosis not present

## 2021-01-04 DIAGNOSIS — Z951 Presence of aortocoronary bypass graft: Secondary | ICD-10-CM | POA: Diagnosis not present

## 2021-01-04 DIAGNOSIS — Z953 Presence of xenogenic heart valve: Secondary | ICD-10-CM | POA: Diagnosis not present

## 2021-02-07 DIAGNOSIS — H5462 Unqualified visual loss, left eye, normal vision right eye: Secondary | ICD-10-CM | POA: Diagnosis not present

## 2021-02-07 DIAGNOSIS — I69391 Dysphagia following cerebral infarction: Secondary | ICD-10-CM | POA: Diagnosis not present

## 2021-02-07 DIAGNOSIS — I69398 Other sequelae of cerebral infarction: Secondary | ICD-10-CM | POA: Diagnosis not present

## 2021-02-07 DIAGNOSIS — I119 Hypertensive heart disease without heart failure: Secondary | ICD-10-CM | POA: Diagnosis not present

## 2021-02-28 DIAGNOSIS — Z8673 Personal history of transient ischemic attack (TIA), and cerebral infarction without residual deficits: Secondary | ICD-10-CM | POA: Diagnosis not present

## 2021-02-28 DIAGNOSIS — I10 Essential (primary) hypertension: Secondary | ICD-10-CM | POA: Diagnosis not present

## 2021-02-28 DIAGNOSIS — Z0000 Encounter for general adult medical examination without abnormal findings: Secondary | ICD-10-CM | POA: Diagnosis not present

## 2021-04-10 DIAGNOSIS — I69398 Other sequelae of cerebral infarction: Secondary | ICD-10-CM | POA: Diagnosis not present

## 2021-04-18 DIAGNOSIS — H53461 Homonymous bilateral field defects, right side: Secondary | ICD-10-CM | POA: Diagnosis not present

## 2021-09-02 DIAGNOSIS — W19XXXA Unspecified fall, initial encounter: Secondary | ICD-10-CM | POA: Diagnosis not present

## 2021-09-02 DIAGNOSIS — Z780 Asymptomatic menopausal state: Secondary | ICD-10-CM | POA: Diagnosis not present

## 2021-09-02 DIAGNOSIS — Y92009 Unspecified place in unspecified non-institutional (private) residence as the place of occurrence of the external cause: Secondary | ICD-10-CM | POA: Diagnosis not present

## 2021-09-02 DIAGNOSIS — I10 Essential (primary) hypertension: Secondary | ICD-10-CM | POA: Diagnosis not present

## 2021-09-02 DIAGNOSIS — Z8673 Personal history of transient ischemic attack (TIA), and cerebral infarction without residual deficits: Secondary | ICD-10-CM | POA: Diagnosis not present

## 2021-09-02 DIAGNOSIS — R739 Hyperglycemia, unspecified: Secondary | ICD-10-CM | POA: Diagnosis not present

## 2021-10-17 DIAGNOSIS — H26492 Other secondary cataract, left eye: Secondary | ICD-10-CM | POA: Diagnosis not present

## 2021-10-17 DIAGNOSIS — I69398 Other sequelae of cerebral infarction: Secondary | ICD-10-CM | POA: Diagnosis not present

## 2021-11-21 DIAGNOSIS — M545 Low back pain, unspecified: Secondary | ICD-10-CM | POA: Diagnosis not present

## 2021-11-21 DIAGNOSIS — R10814 Left lower quadrant abdominal tenderness: Secondary | ICD-10-CM | POA: Diagnosis not present

## 2021-11-21 DIAGNOSIS — M4316 Spondylolisthesis, lumbar region: Secondary | ICD-10-CM | POA: Diagnosis not present

## 2021-11-21 DIAGNOSIS — M5136 Other intervertebral disc degeneration, lumbar region: Secondary | ICD-10-CM | POA: Diagnosis not present

## 2021-11-24 ENCOUNTER — Other Ambulatory Visit: Payer: Self-pay | Admitting: Physician Assistant

## 2021-11-24 ENCOUNTER — Ambulatory Visit
Admission: RE | Admit: 2021-11-24 | Discharge: 2021-11-24 | Disposition: A | Discharge status: Home / Self Care | Payer: Medicare Other | Source: Ambulatory Visit | Attending: Physician Assistant | Admitting: Physician Assistant

## 2021-11-24 DIAGNOSIS — M545 Low back pain, unspecified: Secondary | ICD-10-CM

## 2021-11-24 DIAGNOSIS — R1032 Left lower quadrant pain: Secondary | ICD-10-CM | POA: Insufficient documentation

## 2021-11-24 DIAGNOSIS — R10814 Left lower quadrant abdominal tenderness: Secondary | ICD-10-CM | POA: Diagnosis not present

## 2021-11-24 DIAGNOSIS — K7689 Other specified diseases of liver: Secondary | ICD-10-CM | POA: Diagnosis not present

## 2021-11-24 DIAGNOSIS — N281 Cyst of kidney, acquired: Secondary | ICD-10-CM | POA: Diagnosis not present

## 2021-11-24 MED ORDER — IOHEXOL 300 MG/ML  SOLN
80.0000 mL | Freq: Once | INTRAMUSCULAR | Status: AC | PRN
  Administered 2021-11-24: 80 mL via INTRAVENOUS

## 2021-11-30 ENCOUNTER — Emergency Department
Admission: EM | Admit: 2021-11-30 | Discharge: 2021-12-01 | Disposition: A | Discharge status: Home / Self Care | Payer: Medicare Other | Attending: Emergency Medicine | Admitting: Emergency Medicine

## 2021-11-30 DIAGNOSIS — D72829 Elevated white blood cell count, unspecified: Secondary | ICD-10-CM | POA: Diagnosis not present

## 2021-11-30 DIAGNOSIS — S32010A Wedge compression fracture of first lumbar vertebra, initial encounter for closed fracture: Secondary | ICD-10-CM | POA: Diagnosis not present

## 2021-11-30 DIAGNOSIS — R109 Unspecified abdominal pain: Secondary | ICD-10-CM | POA: Insufficient documentation

## 2021-11-30 DIAGNOSIS — M4316 Spondylolisthesis, lumbar region: Secondary | ICD-10-CM | POA: Diagnosis not present

## 2021-11-30 DIAGNOSIS — F0390 Unspecified dementia without behavioral disturbance: Secondary | ICD-10-CM | POA: Diagnosis not present

## 2021-11-30 DIAGNOSIS — S3992XA Unspecified injury of lower back, initial encounter: Secondary | ICD-10-CM | POA: Diagnosis present

## 2021-11-30 DIAGNOSIS — K5730 Diverticulosis of large intestine without perforation or abscess without bleeding: Secondary | ICD-10-CM | POA: Diagnosis not present

## 2021-11-30 DIAGNOSIS — K8020 Calculus of gallbladder without cholecystitis without obstruction: Secondary | ICD-10-CM | POA: Diagnosis not present

## 2021-11-30 DIAGNOSIS — W19XXXA Unspecified fall, initial encounter: Secondary | ICD-10-CM | POA: Diagnosis not present

## 2021-11-30 DIAGNOSIS — M549 Dorsalgia, unspecified: Secondary | ICD-10-CM | POA: Diagnosis not present

## 2021-11-30 DIAGNOSIS — I10 Essential (primary) hypertension: Secondary | ICD-10-CM | POA: Diagnosis not present

## 2021-11-30 MED ORDER — ACETAMINOPHEN 325 MG PO TABS
650.0000 mg | ORAL_TABLET | Freq: Once | ORAL | Status: AC
  Administered 2021-11-30: 650 mg via ORAL
  Filled 2021-11-30: qty 2

## 2021-11-30 MED ORDER — TRAMADOL HCL 50 MG PO TABS
50.0000 mg | ORAL_TABLET | Freq: Once | ORAL | Status: AC
  Administered 2021-11-30: 50 mg via ORAL
  Filled 2021-11-30: qty 1

## 2021-11-30 NOTE — ED Provider Notes (Signed)
[facility] Provider Note    Event Date/Time   First MD Initiated Contact with Patient 11/30/21 2315     (approximate)  History   Chief Complaint: Back Pain (C/o back pain from known compression fracture. Has had a fall within the last week and has not been seen for it yet.  States pain has become worse and intense. Coming from home)  HPI  Madison Brown is a 88 y.o. female with a past medical history of dementia presents to the emergency department for abdominal pain and back pain.  According to the daughter patient was seen by her PCP recently had a CT scan of the abdomen/pelvis approximately 1 week ago at that time found what appeared to be an L1 compression fracture which is thought to be the cause of the patient's abdominal/back pain.  She was given a short course of tramadol which has since run out.  Daughter states the patient has been complaining of increased abdominal and back pain.  Patient has dementia at baseline but seems somewhat worse recently.  Daughter states they are having trouble caring for her at home.  States she was complaining so much of back pain tonight and requested to come back to the hospital so the daughter called EMS to bring her back here for evaluation.  I spoke to the daughter regarding goals of tonight's work-up such as rehab/nursing home versus medical diagnosis.  Daughter is not sure of the neck step she wishes to take is not sure that they want to place her in a rehab or nursing facility and the patient is adamantly against this.  Patient does have reported dementia however daughter states they have not filed for a power of attorney or guardianship.  Physical Exam   Triage Vital Signs: ED Triage Vitals  Enc Vitals Group     BP 11/30/21 2319 (!) 144/63     Pulse Rate 11/30/21 2320 79     Resp 11/30/21 2320 18     Temp 11/30/21 2319 98.3 F (36.8 C)     Temp Source 11/30/21 2319 Oral     SpO2 11/30/21 2319 97 %     Weight --       Height --      Head Circumference --      Peak Flow --      Pain Score 11/30/21 2319 10     Pain Loc --      Pain Edu? --      Excl. in [place]? --     Most recent vital signs: Vitals:   11/30/21 2319 11/30/21 2320  BP: (!) 144/63   Pulse:  79  Resp:  18  Temp: 98.3 F (36.8 C)   SpO2: 97%     General: Awake, no distress.  CV:  Good peripheral perfusion.  Regular rate and rhythm  Resp:  Normal effort.  Equal breath sounds bilaterally.  Abd:  No distention.  Soft, mild diffuse tenderness throughout the abdomen. Other:  No tenderness to CT or L-spine palpation.  No obvious step-offs or deformities.   ED Results / Procedures / Treatments    RADIOLOGY  I have reviewed and interpreted the CT images I do not see any large bowel obstruction or other significant abnormality on my evaluation. Radiology has read an acute L1 compression fracture approximately 30 to 40% loss.  Also shows dilation of the main pancreatic duct could possibly suggest underlying stricture or neoplasm.   MEDICATIONS ORDERED IN ED:  Medications  traMADol (ULTRAM) tablet 50 mg (50 mg Oral Given 11/30/21 2330)  acetaminophen (TYLENOL) tablet 650 mg (650 mg Oral Given 11/30/21 2330)     IMPRESSION / MDM / ASSESSMENT AND PLAN / ED COURSE  I reviewed the triage vital signs and the nursing notes.  Patient's presentation is most consistent with acute presentation with potential threat to life or bodily function.  Patient presents emergency department for abdominal pain and back pain.  Overall the patient appears well, no distress.  Patient is unable to give a good history, states she fell but cannot tell me what day.  Daughter is a much better historian.  We will obtain imaging of the abdomen/pelvis with a CT lumbar spine as well.  We will check labs and continue to closely monitor.  Daughter agreeable to plan of care.  We will discuss disposition further with the daughter once the patient's medical work-up is been  completed.  Patient's lab work has resulted showing a moderate leukocytosis, this could be pain related although could be infectious as well.  Patient's chemistry is reassuring with normal LFTs, normal total bilirubin and a normal lipase.  Urinalysis did not show any obvious infection.  I discussed with the daughter the CT findings pain is likely related to the L1 compression fracture.  We will treat with tramadol.  Patient received tramadol in the ED and is denying any pain currently.  She appears well.  Daughter states the patient really wishes to go home.  We will refer to Dr. Rosita Kea for consideration of possible kyphoplasty.  I will prescribe tramadol going home.  I discussed with the daughter the CT findings regarding the pancreas as well the possibility of neoplasm.  Given the patient's age I doubt much would be done however she will follow-up with her PCP.  I also discussed with the daughter talking to her siblings to discuss POA for the patient as well.  Daughter agreeable.  Discussed and provided return precautions.  FINAL CLINICAL IMPRESSION(S) / ED DIAGNOSES   Abdominal pain Back pain     Minna Antis, MD 12/01/21 [phone]

## 2021-12-01 ENCOUNTER — Other Ambulatory Visit: Payer: Self-pay

## 2021-12-01 ENCOUNTER — Emergency Department: Payer: Medicare Other

## 2021-12-01 DIAGNOSIS — M4316 Spondylolisthesis, lumbar region: Secondary | ICD-10-CM | POA: Diagnosis not present

## 2021-12-01 DIAGNOSIS — K8020 Calculus of gallbladder without cholecystitis without obstruction: Secondary | ICD-10-CM | POA: Diagnosis not present

## 2021-12-01 DIAGNOSIS — K5730 Diverticulosis of large intestine without perforation or abscess without bleeding: Secondary | ICD-10-CM | POA: Diagnosis not present

## 2021-12-01 LAB — COMPREHENSIVE METABOLIC PANEL
ALT: 17 U/L (ref 0–44)
AST: 20 U/L (ref 15–41)
Albumin: 3.4 g/dL — ABNORMAL LOW (ref 3.5–5.0)
Alkaline Phosphatase: 86 U/L (ref 38–126)
Anion gap: 8 (ref 5–15)
BUN: 25 mg/dL — ABNORMAL HIGH (ref 8–23)
CO2: 28 mmol/L (ref 22–32)
Calcium: 8.9 mg/dL (ref 8.9–10.3)
Chloride: 99 mmol/L (ref 98–111)
Creatinine, Ser: 1.02 mg/dL — ABNORMAL HIGH (ref 0.44–1.00)
GFR, Estimated: 52 mL/min — ABNORMAL LOW (ref >60)
Glucose, Bld: 147 mg/dL — ABNORMAL HIGH (ref 70–99)
Potassium: 3.5 mmol/L (ref 3.5–5.1)
Sodium: 135 mmol/L (ref 135–145)
Total Bilirubin: 0.7 mg/dL (ref 0.3–1.2)
Total Protein: 6.9 g/dL (ref 6.5–8.1)

## 2021-12-01 LAB — URINALYSIS, COMPLETE (UACMP) WITH MICROSCOPIC
Bacteria, UA: NONE SEEN
Bilirubin Urine: NEGATIVE
Glucose, UA: NEGATIVE mg/dL
Hgb urine dipstick: NEGATIVE
Ketones, ur: NEGATIVE mg/dL
Leukocytes,Ua: NEGATIVE
Nitrite: NEGATIVE
Protein, ur: NEGATIVE mg/dL
Specific Gravity, Urine: 1.026 (ref 1.005–1.030)
pH: 7 (ref 5.0–8.0)

## 2021-12-01 LAB — CBC
HCT: 33.4 % — ABNORMAL LOW (ref 36.0–46.0)
Hemoglobin: 11 g/dL — ABNORMAL LOW (ref 12.0–15.0)
MCH: 29.3 pg (ref 26.0–34.0)
MCHC: 32.9 g/dL (ref 30.0–36.0)
MCV: 89.1 fL (ref 80.0–100.0)
Platelets: 203 10*3/uL (ref 150–400)
RBC: 3.75 MIL/uL — ABNORMAL LOW (ref 3.87–5.11)
RDW: 12.1 % (ref 11.5–15.5)
WBC: 18.7 10*3/uL — ABNORMAL HIGH (ref 4.0–10.5)
nRBC: 0 % (ref 0.0–0.2)

## 2021-12-01 LAB — LIPASE, BLOOD: Lipase: 24 U/L (ref 11–51)

## 2021-12-01 MED ORDER — IOHEXOL 300 MG/ML  SOLN
80.0000 mL | Freq: Once | INTRAMUSCULAR | Status: AC | PRN
  Administered 2021-12-01: 80 mL via INTRAVENOUS

## 2021-12-01 MED ORDER — TRAMADOL HCL 50 MG PO TABS: 50.0000 mg | ORAL_TABLET | Freq: Four times a day (QID) | ORAL | 0 refills | Status: DC | PRN

## 2021-12-01 NOTE — Discharge Instructions (Addendum)
Please call the number provided for orthopedics to arrange a follow-up appointment regarding your L1 compression fracture.  Please take your prescribed medication as needed for pain.  You may take this in addition to Tylenol if needed.  We recommend dosing Tylenol 650 mg every 8 hours for discomfort and using tramadol if needed for significant discomfort as written on the box.  Please follow-up with her primary care doctor.  As we discussed the CT findings today did show thickening of the pancreatic duct which could be due to mucus in the pancreatic duct it could also be due to to possible neoplasm/cancerous cause.  Lab work is reassuring.

## 2021-12-02 ENCOUNTER — Inpatient Hospital Stay
Admission: RE | Admit: 2021-12-02 | Discharge: 2021-12-02 | Disposition: A | Discharge status: Home / Self Care | Payer: Medicare Other | Source: Ambulatory Visit | Attending: Internal Medicine | Admitting: Internal Medicine

## 2021-12-02 ENCOUNTER — Other Ambulatory Visit: Payer: Self-pay | Admitting: Internal Medicine

## 2021-12-02 ENCOUNTER — Inpatient Hospital Stay
Admission: RE | Admit: 2021-12-02 | Discharge: 2021-12-02 | Disposition: A | Discharge status: Home / Self Care | Payer: Medicare Other | Source: Ambulatory Visit

## 2021-12-02 DIAGNOSIS — S32010A Wedge compression fracture of first lumbar vertebra, initial encounter for closed fracture: Secondary | ICD-10-CM

## 2021-12-02 HISTORY — PX: IR RADIOLOGIST EVAL & MGMT: IMG5224

## 2021-12-02 NOTE — H&P (Signed)
Interventional Radiology - Clinic Visit, Initial H&P    Referring Provider: Enid Baas, MD  Reason for Visit: L1 compression fracture    History of Present Illness  Madison Brown is a 88 y.o. female with a relevant past medical history of osteoporosis and stroke in 2022 on Plavix seen today in Interventional Radiology clinic for L1 compression fracture.  Patient reports the onset of vague abdominal/lower back pain approximately 1.5 weeks ago.  She was treated empirically with antibiotics for presumed infection, without significant improvement.  Ultimately, a CT of the abdomen and pelvis on December 01, 2021 demonstrated progressive height loss of the L1 vertebral body, compatible with an acute compression fracture.    The patient reports no original fall or trauma to the lower back.  She rates the pain in her lower back as severe 10/10, with moderate improvement with pain medication assuming she is not moving.  The pain has resulted in significant mobility issues, now requiring a walker and is unable to leave the house.  She has been prescribed prescription pain medication (tramadol), which she is unable to tolerate due to confusion and worsening underlying constipation.    Family with the patient today states that before her more recent symptoms over the past couple of weeks, she was able to walk in the mall and grocery store, and vacuum her house weekly.  Since the onset of her lower back pain, she is unable to perform any of these activities.    Of note, the patient had a DEXA scan in 2014 and carries a diagnosis of osteoporosis.  She is no previous history of a compression fracture.  The patient also has a history of a stroke approximately 1 year ago in fall 2022, and is currently on Plavix.    Additional Past Medical History Past Medical History:  Diagnosis Date   Coronary artery disease    Dr. Gwen Pounds     Surgical History  Past Surgical History:  Procedure Laterality Date    CORONARY ARTERY BYPASS GRAFT  2009   Dr. Zebedee Iba at [facility]   VAGINAL DELIVERY     3     Medications  I have reviewed the current medication list. Refer to chart for details. Current Outpatient Medications  Medication Instructions   amLODipine (NORVASC) 10 mg, Oral, Daily   atorvastatin (LIPITOR) 40 mg, Oral, Daily   clopidogrel (PLAVIX) 75 mg, Daily   feeding supplement (ENSURE ENLIVE / ENSURE PLUS) LIQD 237 mLs, Oral, 2 times daily between meals   traMADol (ULTRAM) 50 mg, Oral, Every 6 hours PRN      Allergies No Known Allergies Does patient have contrast allergy: No     Physical Exam Current Vitals Temp: 98 F (36.7 C) ( )  Pulse Rate: 79  Resp: 18  BP: 139/68  SpO2: 97 %        There is no height or weight on file to calculate BMI.  General: Alert and answers questions appropriately.  Cardiac: Regular rate. No dependent edema. Pulmonary: Normal work of breathing. On room air. Back: Lower back pain overlying expected location of L1.    Pertinent Lab Results    Latest Ref Rng & Units 12/01/2021   12:02 AM 12/01/2020    5:27 AM 11/30/2020    5:07 AM  CBC  WBC 4.0 - 10.5 K/uL 18.7  7.8  7.8   Hemoglobin 12.0 - 15.0 g/dL 75.1  02.5  85.2   Hematocrit 36.0 - 46.0 % 33.4  31.0  31.5   Platelets 150 - 400 K/uL 203  178  166       Latest Ref Rng & Units 12/01/2021   12:02 AM 12/04/2020    3:15 AM 12/01/2020    5:27 AM  CMP  Glucose 70 - 99 mg/dL 147  98  109   BUN 8 - 23 mg/dL 25  19  19    Creatinine 0.44 - 1.00 mg/dL 1.02  1.04  1.08   Sodium 135 - 145 mmol/L 135  137  137   Potassium 3.5 - 5.1 mmol/L 3.5  3.7  3.6   Chloride 98 - 111 mmol/L 99  103  102   CO2 22 - 32 mmol/L 28  28  32   Calcium 8.9 - 10.3 mg/dL 8.9  8.5  8.4   Total Protein 6.5 - 8.1 g/dL 6.9     Total Bilirubin 0.3 - 1.2 mg/dL 0.7     Alkaline Phos 38 - 126 U/L 86     AST 15 - 41 U/L 20     ALT 0 - 44 U/L 17         Relevant and/or Recent Imaging: CT L spine Dec 01 2021   Personally reviewed: Progressive height loss of L1 compare to Nov 24 2021, consistent with acute compression fracture with ~40-50% height loss     Assessment & Plan:   Patient has suffered acute osteoporotic fracture of the L1 vertebra.   History and exam have demonstrated the following:  Acute/Subacute fracture by imaging dated 12/01/2021, Pain on exam concordant with level of fracture, Failure of conservative therapy and pain refractory to narcotic pain mediation, Inability to tolerate narcotic pain medication due to side effects, and Significant disability on the [place] with 18/24 positive symptoms, reflecting significant impact/impairment of (ADLs)   ICD-10-CM Codes that Support Medical Necessity (BamBlog.de.[email or web address]?articleId=[email or web address])  M80.08XA    Age-related osteoporosis with current pathological fracture, vertebra(e), initial encounter for fracture   Plan:  L1 vertebral body augmentation with balloon kyphoplasty  Post-procedure disposition: outpatient DRI-A  Medication holds: Plavix (to be cleared)   The patient has suffered a fracture of the L1 vertebral body. It is recommended that patients aged 30 years or older be evaluated for possible testing or treatment of osteoporosis. A copy of this consult report is sent to the patient's referring physician.  Advanced Care Plan: The patient did not want to provide an [place] at the time of this visit     Total time spent on today's visit was over 60 Minutes, including both face-to-face time and non face-to-face time, personally spent on review of chart (including labs and relevant imaging), discussing further workup and treatment options, referral to specialist if needed, reviewing outside records if pertinent, answering patient questions, and coordinating care regarding L1 fracture as well as management strategy.        Albin Felling, MD   Vascular and Interventional Radiology 12/02/2021 11:56 AM

## 2021-12-07 ENCOUNTER — Emergency Department: Payer: Medicare Other

## 2021-12-07 ENCOUNTER — Encounter: Payer: Self-pay | Admitting: Emergency Medicine

## 2021-12-07 ENCOUNTER — Inpatient Hospital Stay
Admission: EM | Admit: 2021-12-07 | Discharge: 2021-12-11 | DRG: 481 | Disposition: A | Discharge status: Skilled Nursing Facility | Payer: Medicare Other | Attending: Osteopathic Medicine | Admitting: Osteopathic Medicine

## 2021-12-07 ENCOUNTER — Other Ambulatory Visit: Payer: Self-pay

## 2021-12-07 DIAGNOSIS — R4182 Altered mental status, unspecified: Secondary | ICD-10-CM | POA: Diagnosis not present

## 2021-12-07 DIAGNOSIS — Z7902 Long term (current) use of antithrombotics/antiplatelets: Secondary | ICD-10-CM | POA: Diagnosis not present

## 2021-12-07 DIAGNOSIS — F0390 Unspecified dementia without behavioral disturbance: Secondary | ICD-10-CM | POA: Diagnosis present

## 2021-12-07 DIAGNOSIS — M62838 Other muscle spasm: Secondary | ICD-10-CM | POA: Diagnosis not present

## 2021-12-07 DIAGNOSIS — S72141D Displaced intertrochanteric fracture of right femur, subsequent encounter for closed fracture with routine healing: Secondary | ICD-10-CM | POA: Diagnosis not present

## 2021-12-07 DIAGNOSIS — Z515 Encounter for palliative care: Secondary | ICD-10-CM | POA: Diagnosis not present

## 2021-12-07 DIAGNOSIS — S32019A Unspecified fracture of first lumbar vertebra, initial encounter for closed fracture: Secondary | ICD-10-CM | POA: Diagnosis not present

## 2021-12-07 DIAGNOSIS — I252 Old myocardial infarction: Secondary | ICD-10-CM | POA: Diagnosis not present

## 2021-12-07 DIAGNOSIS — W06XXXA Fall from bed, initial encounter: Secondary | ICD-10-CM | POA: Diagnosis present

## 2021-12-07 DIAGNOSIS — Z951 Presence of aortocoronary bypass graft: Secondary | ICD-10-CM

## 2021-12-07 DIAGNOSIS — M4856XA Collapsed vertebra, not elsewhere classified, lumbar region, initial encounter for fracture: Secondary | ICD-10-CM | POA: Diagnosis not present

## 2021-12-07 DIAGNOSIS — Y92009 Unspecified place in unspecified non-institutional (private) residence as the place of occurrence of the external cause: Secondary | ICD-10-CM

## 2021-12-07 DIAGNOSIS — W19XXXD Unspecified fall, subsequent encounter: Secondary | ICD-10-CM | POA: Diagnosis not present

## 2021-12-07 DIAGNOSIS — D72829 Elevated white blood cell count, unspecified: Secondary | ICD-10-CM

## 2021-12-07 DIAGNOSIS — S72001A Fracture of unspecified part of neck of right femur, initial encounter for closed fracture: Secondary | ICD-10-CM | POA: Diagnosis not present

## 2021-12-07 DIAGNOSIS — I10 Essential (primary) hypertension: Secondary | ICD-10-CM | POA: Diagnosis present

## 2021-12-07 DIAGNOSIS — Z8673 Personal history of transient ischemic attack (TIA), and cerebral infarction without residual deficits: Secondary | ICD-10-CM | POA: Diagnosis not present

## 2021-12-07 DIAGNOSIS — W19XXXA Unspecified fall, initial encounter: Secondary | ICD-10-CM

## 2021-12-07 DIAGNOSIS — M810 Age-related osteoporosis without current pathological fracture: Secondary | ICD-10-CM | POA: Diagnosis present

## 2021-12-07 DIAGNOSIS — R410 Disorientation, unspecified: Secondary | ICD-10-CM

## 2021-12-07 DIAGNOSIS — S72141A Displaced intertrochanteric fracture of right femur, initial encounter for closed fracture: Secondary | ICD-10-CM | POA: Diagnosis not present

## 2021-12-07 DIAGNOSIS — Z79899 Other long term (current) drug therapy: Secondary | ICD-10-CM

## 2021-12-07 DIAGNOSIS — Z8781 Personal history of (healed) traumatic fracture: Secondary | ICD-10-CM

## 2021-12-07 DIAGNOSIS — M79605 Pain in left leg: Secondary | ICD-10-CM | POA: Diagnosis not present

## 2021-12-07 DIAGNOSIS — Z953 Presence of xenogenic heart valve: Secondary | ICD-10-CM | POA: Diagnosis not present

## 2021-12-07 DIAGNOSIS — M549 Dorsalgia, unspecified: Secondary | ICD-10-CM | POA: Diagnosis not present

## 2021-12-07 DIAGNOSIS — Z7982 Long term (current) use of aspirin: Secondary | ICD-10-CM | POA: Diagnosis not present

## 2021-12-07 DIAGNOSIS — E785 Hyperlipidemia, unspecified: Secondary | ICD-10-CM | POA: Diagnosis not present

## 2021-12-07 DIAGNOSIS — I2510 Atherosclerotic heart disease of native coronary artery without angina pectoris: Secondary | ICD-10-CM | POA: Diagnosis present

## 2021-12-07 DIAGNOSIS — Z043 Encounter for examination and observation following other accident: Secondary | ICD-10-CM | POA: Diagnosis not present

## 2021-12-07 DIAGNOSIS — E43 Unspecified severe protein-calorie malnutrition: Secondary | ICD-10-CM | POA: Diagnosis not present

## 2021-12-07 DIAGNOSIS — E7800 Pure hypercholesterolemia, unspecified: Secondary | ICD-10-CM | POA: Diagnosis not present

## 2021-12-07 DIAGNOSIS — K5900 Constipation, unspecified: Secondary | ICD-10-CM | POA: Diagnosis not present

## 2021-12-07 DIAGNOSIS — M25552 Pain in left hip: Secondary | ICD-10-CM | POA: Diagnosis not present

## 2021-12-07 DIAGNOSIS — Z7189 Other specified counseling: Secondary | ICD-10-CM | POA: Diagnosis not present

## 2021-12-07 DIAGNOSIS — S7291XA Unspecified fracture of right femur, initial encounter for closed fracture: Secondary | ICD-10-CM | POA: Diagnosis present

## 2021-12-07 DIAGNOSIS — S8992XA Unspecified injury of left lower leg, initial encounter: Secondary | ICD-10-CM | POA: Diagnosis not present

## 2021-12-07 DIAGNOSIS — S72101A Unspecified trochanteric fracture of right femur, initial encounter for closed fracture: Secondary | ICD-10-CM | POA: Diagnosis not present

## 2021-12-07 DIAGNOSIS — S32010D Wedge compression fracture of first lumbar vertebra, subsequent encounter for fracture with routine healing: Secondary | ICD-10-CM | POA: Diagnosis not present

## 2021-12-07 LAB — CBC WITH DIFFERENTIAL/PLATELET
Abs Immature Granulocytes: 0.16 10*3/uL — ABNORMAL HIGH (ref 0.00–0.07)
Basophils Absolute: 0.1 10*3/uL (ref 0.0–0.1)
Basophils Relative: 0 %
Eosinophils Absolute: 0 10*3/uL (ref 0.0–0.5)
Eosinophils Relative: 0 %
HCT: 30.4 % — ABNORMAL LOW (ref 36.0–46.0)
Hemoglobin: 10.3 g/dL — ABNORMAL LOW (ref 12.0–15.0)
Immature Granulocytes: 1 %
Lymphocytes Relative: 5 %
Lymphs Abs: 0.9 10*3/uL (ref 0.7–4.0)
MCH: 29.9 pg (ref 26.0–34.0)
MCHC: 33.9 g/dL (ref 30.0–36.0)
MCV: 88.1 fL (ref 80.0–100.0)
Monocytes Absolute: 1.4 10*3/uL — ABNORMAL HIGH (ref 0.1–1.0)
Monocytes Relative: 8 %
Neutro Abs: 15.6 10*3/uL — ABNORMAL HIGH (ref 1.7–7.7)
Neutrophils Relative %: 86 %
Platelets: 156 10*3/uL (ref 150–400)
RBC: 3.45 MIL/uL — ABNORMAL LOW (ref 3.87–5.11)
RDW: 12.5 % (ref 11.5–15.5)
WBC: 18.1 10*3/uL — ABNORMAL HIGH (ref 4.0–10.5)
nRBC: 0 % (ref 0.0–0.2)

## 2021-12-07 LAB — COMPREHENSIVE METABOLIC PANEL
ALT: 27 U/L (ref 0–44)
AST: 30 U/L (ref 15–41)
Albumin: 3.2 g/dL — ABNORMAL LOW (ref 3.5–5.0)
Alkaline Phosphatase: 122 U/L (ref 38–126)
Anion gap: 9 (ref 5–15)
BUN: 18 mg/dL (ref 8–23)
CO2: 28 mmol/L (ref 22–32)
Calcium: 8.3 mg/dL — ABNORMAL LOW (ref 8.9–10.3)
Chloride: 97 mmol/L — ABNORMAL LOW (ref 98–111)
Creatinine, Ser: 1.22 mg/dL — ABNORMAL HIGH (ref 0.44–1.00)
GFR, Estimated: 42 mL/min — ABNORMAL LOW (ref >60)
Glucose, Bld: 138 mg/dL — ABNORMAL HIGH (ref 70–99)
Potassium: 3.4 mmol/L — ABNORMAL LOW (ref 3.5–5.1)
Sodium: 134 mmol/L — ABNORMAL LOW (ref 135–145)
Total Bilirubin: 1 mg/dL (ref 0.3–1.2)
Total Protein: 6.4 g/dL — ABNORMAL LOW (ref 6.5–8.1)

## 2021-12-07 LAB — TYPE AND SCREEN
ABO/RH(D): O POS
Antibody Screen: NEGATIVE

## 2021-12-07 LAB — PROTIME-INR
INR: 1.2 (ref 0.8–1.2)
Prothrombin Time: 15.1 seconds (ref 11.4–15.2)

## 2021-12-07 LAB — APTT: aPTT: 30 seconds (ref 24–36)

## 2021-12-07 MED ORDER — SODIUM CHLORIDE 0.9 % IV SOLN: Freq: Once | INTRAVENOUS | Status: AC

## 2021-12-07 MED ORDER — MORPHINE SULFATE (PF) 4 MG/ML IV SOLN
INTRAVENOUS | Status: AC
  Administered 2021-12-07: 2 mg via INTRAVENOUS
  Filled 2021-12-07: qty 1

## 2021-12-07 MED ORDER — HYDROCODONE-ACETAMINOPHEN 5-325 MG PO TABS: 1.0000 | ORAL_TABLET | Freq: Four times a day (QID) | ORAL | Status: DC | PRN

## 2021-12-07 MED ORDER — POLYETHYLENE GLYCOL 3350 17 G PO PACK: 17.0000 g | PACK | Freq: Every day | ORAL | Status: DC | PRN

## 2021-12-07 MED ORDER — HYDROMORPHONE HCL 1 MG/ML IJ SOLN
0.5000 mg | INTRAMUSCULAR | Status: DC | PRN
  Administered 2021-12-07 – 2021-12-08 (×2): 0.5 mg via INTRAVENOUS
  Filled 2021-12-07 (×2): qty 1

## 2021-12-07 MED ORDER — ATORVASTATIN CALCIUM 20 MG PO TABS: 40.0000 mg | ORAL_TABLET | Freq: Every day | ORAL | Status: DC

## 2021-12-07 MED ORDER — AMLODIPINE BESYLATE 10 MG PO TABS: 10.0000 mg | ORAL_TABLET | Freq: Every day | ORAL | Status: DC

## 2021-12-07 MED ORDER — CEFAZOLIN SODIUM-DEXTROSE 2-4 GM/100ML-% IV SOLN
2.0000 g | Freq: Once | INTRAVENOUS | Status: AC
  Administered 2021-12-07: 2 g via INTRAVENOUS
  Filled 2021-12-07: qty 100

## 2021-12-07 MED ORDER — TRANEXAMIC ACID-NACL 1000-0.7 MG/100ML-% IV SOLN
1000.0000 mg | INTRAVENOUS | Status: AC
  Administered 2021-12-08: 1000 mg via INTRAVENOUS

## 2021-12-07 MED ORDER — ACETAMINOPHEN 325 MG PO TABS
650.0000 mg | ORAL_TABLET | Freq: Once | ORAL | Status: AC
  Administered 2021-12-07: 325 mg via ORAL
  Filled 2021-12-07: qty 2

## 2021-12-07 MED ORDER — ONDANSETRON HCL 4 MG/2ML IJ SOLN
INTRAMUSCULAR | Status: AC
  Filled 2021-12-07: qty 2

## 2021-12-07 MED ORDER — MORPHINE SULFATE (PF) 4 MG/ML IV SOLN: 4.0000 mg | Freq: Once | INTRAVENOUS | Status: AC

## 2021-12-07 MED ORDER — HEPARIN SODIUM (PORCINE) 5000 UNIT/ML IJ SOLN
5000.0000 [IU] | Freq: Three times a day (TID) | INTRAMUSCULAR | Status: DC
  Administered 2021-12-07: 5000 [IU] via SUBCUTANEOUS
  Filled 2021-12-07: qty 1

## 2021-12-07 MED ORDER — LACTATED RINGERS IV BOLUS
1000.0000 mL | Freq: Once | INTRAVENOUS | Status: AC
  Administered 2021-12-07: 1000 mL via INTRAVENOUS

## 2021-12-07 MED ORDER — ACETAMINOPHEN 325 MG PO TABS
ORAL_TABLET | ORAL | Status: AC
  Filled 2021-12-07: qty 1

## 2021-12-07 MED ORDER — ONDANSETRON HCL 4 MG/2ML IJ SOLN
4.0000 mg | Freq: Once | INTRAMUSCULAR | Status: AC
  Administered 2021-12-07: 2 mg via INTRAVENOUS

## 2021-12-07 NOTE — ED Triage Notes (Signed)
Pt with unwitnessed fall, family found on floor.  Pt has dementia.  No blood thinners. No obvious injury or specific complaint.  Pt is to have spinal fusion surgery this week.

## 2021-12-07 NOTE — Assessment & Plan Note (Signed)
-   Restart home amlodipine

## 2021-12-07 NOTE — Assessment & Plan Note (Signed)
WBC elevated 18.1, previously 18.7 approximately 6 days ago.  Likely partially explained by acute fracture, however patient will need continued outpatient follow-up to ensure resolution.  If remains elevated, would benefit from referral to hematology for further follow-up  - Repeat CBC in the a.m.

## 2021-12-07 NOTE — H&P (Signed)
History and Physical    Patient: Madison Brown DPO:[id number] DOB: 06-21-1929 DOA: 12/07/2021 DOS: the patient was seen and examined on 12/07/2021 PCP: Gladstone Lighter, MD  Patient coming from: Home  Chief Complaint:  Chief Complaint  Patient presents with   Fall   HPI: Madison Brown is a 88 y.o. female with medical history significant of CAD s/p CABG, CVA, TAVR, hypertension, hyperlipidemia who presents to the ED after ground-level fall.  History obtained from patient's daughter and daughter-in-law due to underlying dementia and altered mental status.  Patient's daughter Butch Penny states that patient currently lives with her husband who helps take care of her.  7 days ago, patient went to the ED and was found to have an L1 compression fracture for which she was started on tramadol.  Since that time, patient has been a bit confused and family has suspected it may be due to the opioid.  On the morning of 10/29, patient's husband found patient on the ground due to likely fall out of bed.  At that time, EMS was called.  Otherwise, no symptoms of fever, chills, nausea, vomiting, diarrhea, shortness of breath or chest pain noted by patient to her family.  On my conversation with Madison Brown, she denies any pain at this time.  ED course: On arrival to the ED, patient was normotensive at 122/69 with heart rate of 101.  She was saturating at 93% on room air.  After receiving morphine, patient's oxygen levels decreased to 87% and she was placed on 2 L nasal cannula. Work-up notable for Bassi cervical right femoral neck fracture with substantial varus angulation.  Orthopedic surgery was consulted.  TRH consulted for admission.  Review of Systems: unable to review all systems due to the inability of the patient to answer questions.  Due to underlying dementia and altered mental status.  Past Medical History:  Diagnosis Date   Coronary artery disease    Dr. Nehemiah Massed   Past Surgical History:   Procedure Laterality Date   CORONARY ARTERY BYPASS GRAFT  2009   Dr. Cheree Ditto at [facility]  12/02/2021   VAGINAL DELIVERY     3   Social History:  reports that she has never smoked. She has never used smokeless tobacco. She reports that she does not drink alcohol and does not use drugs.  No Known Allergies  No family history on file.  Prior to Admission medications   Medication Sig Start Date End Date Taking? Authorizing Provider  amLODipine (NORVASC) 10 MG tablet Take 10 mg by mouth daily. 11/18/20   [provider]  atorvastatin (LIPITOR) 40 MG tablet Take 40 mg by mouth daily. 09/04/20   [provider]  clopidogrel (PLAVIX) 75 MG tablet Take 75 mg by mouth daily.    [provider]  feeding supplement (ENSURE ENLIVE / ENSURE PLUS) LIQD Take 237 mLs by mouth 2 (two) times daily between meals. 12/06/20   Max Sane, MD  traMADol (ULTRAM) 50 MG tablet Take 1 tablet (50 mg total) by mouth every 6 (six) hours as needed. 12/01/21   Harvest Dark, MD    Physical Exam: Vitals:   12/07/21 1600 12/07/21 1615 12/07/21 1645 12/07/21 1802  BP: 130/86   (!) 139/53  Pulse: (!) 102 90 (!) 105 (!) 108  Resp:    17  Temp:    98.5 F (36.9 C)  TempSrc:      SpO2: 97% 100% 100% (!) 89%  Physical Exam Vitals and nursing note reviewed.  Constitutional:      General: She is not in acute distress.    Appearance: She is not toxic-appearing.  HENT:     Head: Normocephalic and atraumatic.  Eyes:     Conjunctiva/sclera: Conjunctivae normal.     Pupils: Pupils are equal, round, and reactive to light.  Cardiovascular:     Rate and Rhythm: Regular rhythm. Tachycardia present.     Heart sounds: No murmur heard.    No gallop.  Pulmonary:     Effort: Pulmonary effort is normal. No respiratory distress.     Breath sounds: Normal breath sounds. No wheezing, rhonchi or rales.  Abdominal:     General: Bowel sounds are normal. There is no  distension.     Palpations: Abdomen is soft.     Tenderness: There is no abdominal tenderness. There is no guarding.  Musculoskeletal:        General: Signs of injury (Right lower extremity externally rotated and shortened consistent with known femur fracture) present.     Right lower leg: No edema.     Left lower leg: No edema.  Skin:    General: Skin is warm and dry.     Findings: No bruising, erythema, lesion or rash.  Neurological:     Mental Status: She is alert. She is disoriented.     Comments: Oriented to person only Will to move all extremities independently however movement of right lower extremity limited by pain    Data Reviewed: CBC notable for elevated white blood count at 18.1, previously 18.76 days ago.  Hemoglobin low at 10.3 consistent with patient's baseline. DG Chest 1 View  Result Date: 12/07/2021 CLINICAL DATA:  Unwitnessed fall.  Dementia. EXAM: CHEST  1 VIEW COMPARISON:  11/25/2020 FINDINGS: Prior CABG. Atherosclerotic calcification of the aortic arch. Upper normal heart size. The lungs appear clear.  Bony demineralization is present. IMPRESSION: 1. No active cardiopulmonary disease is radiographically apparent. 2. Prior CABG. 3. Bony demineralization. Electronically Signed   By: Gaylyn Rong M.D.   On: 12/07/2021 15:12   DG Hip Unilat With Pelvis 2-3 Views Left  Result Date: 12/07/2021 CLINICAL DATA:  Unwitnessed fall, hip pain, dementia. EXAM: DG HIP (WITH OR WITHOUT PELVIS) 2-3V LEFT COMPARISON:  CT pelvis 12/01/2021 FINDINGS: Bony demineralization. No left hip fracture is identified. Atherosclerosis noted. Patient has a known right basicervical femoral assessed with dedicated right hip imaging. IMPRESSION: 1. No LEFT hip fracture identified, the patient has a known right basicervical femoral neck fracture. 2. Bony demineralization. 3. Atherosclerosis. Electronically Signed   By: Gaylyn Rong M.D.   On: 12/07/2021 15:10   DG Lumbar Spine  Complete  Result Date: 12/07/2021 CLINICAL DATA:  Fall, right hip pain. Known L1 compression fracture. EXAM: LUMBAR SPINE - COMPLETE 4+ VIEW COMPARISON:  Lumbar CT 12/01/2021 FINDINGS: Bony demineralization. Bilateral degenerative facet arthropathy at L4-5 at L5-S1. Stable 60% compression fracture at L1 with some middle column involvement based on the mild posterior bony retropulsion which, like love before, amounts to about 0.4 cm. Stable grade 1 degenerative anterolisthesis L4-5. Stable loss of intervertebral disc height at L4-5 and L5-S1. No new fracture is readily apparent. Atherosclerosis is present, including aortoiliac atherosclerotic disease. IMPRESSION: 1. Stable 60% compression fracture at L1 with some middle column involvement. 2. Stable grade 1 degenerative anterolisthesis at L4-5. 3. Bony demineralization. 4. Aortoiliac atherosclerotic disease. Electronically Signed   By: Gaylyn Rong M.D.   On: 12/07/2021 15:08  DG Hip Unilat  With Pelvis 2-3 Views Right  Result Date: 12/07/2021 CLINICAL DATA:  Unwitnessed fall.  Dementia.  Right hip pain. EXAM: DG HIP (WITH OR WITHOUT PELVIS) 2-3V RIGHT COMPARISON:  CT pelvis 12/01/2021 FINDINGS: New basicervical right femoral neck fracture with substantial varus angulation. Questionable extension into the greater trochanter. Bony demineralization. Atherosclerotic vascular calcifications. IMPRESSION: 1. New basicervical right femoral neck fracture with substantial varus angulation. Questionable extension into the greater trochanter. 2. Bony demineralization. Electronically Signed   By: Gaylyn Rong M.D.   On: 12/07/2021 15:06   CT Head Wo Contrast  Result Date: 12/07/2021 CLINICAL DATA:  Altered mental status.  Trauma.  Fall. EXAM: CT HEAD WITHOUT CONTRAST CT CERVICAL SPINE WITHOUT CONTRAST TECHNIQUE: Multidetector CT imaging of the head and cervical spine was performed following the standard protocol without intravenous contrast. Multiplanar  CT image reconstructions of the cervical spine were also generated. RADIATION DOSE REDUCTION: This exam was performed according to the departmental dose-optimization program which includes automated exposure control, adjustment of the mA and/or kV according to patient size and/or use of iterative reconstruction technique. COMPARISON:  CT scan of the brain November 25, 2020. FINDINGS: CT HEAD FINDINGS Brain: No subdural, epidural, or subarachnoid hemorrhage. Multiple small cerebellar infarcts, unchanged. Cerebellum, brainstem, and basal cisterns otherwise normal. Remote bilateral occipital infarcts. White matter changes. Prominence of ventricles and sulci, stable. No acute cortical ischemia or infarct identified on today's study. No mass effect or midline shift. Vascular: No hyperdense vessel or unexpected calcification. Skull: Normal. Negative for fracture or focal lesion. Sinuses/Orbits: No acute finding. Other: None. CT CERVICAL SPINE FINDINGS Alignment: Trace retrolisthesis of C3 versus C2 and C4. Trace anterolisthesis of C7 versus T1. Trace anterolisthesis of T1 versus T2. No other malalignment. Skull base and vertebrae: No acute fracture. No primary bone lesion or focal pathologic process. Soft tissues and spinal canal: No prevertebral fluid or swelling. No visible canal hematoma. Disc levels: Multilevel degenerative disc disease and facet degenerative changes. Upper chest: Negative. Other: No other abnormalities. IMPRESSION: 1. No acute intracranial abnormalities. 2. No fracture or traumatic malalignment in the cervical spine. 3. Multilevel degenerative changes. Electronically Signed   By: Gerome Sam III M.D.   On: 12/07/2021 14:41   CT Cervical Spine Wo Contrast  Result Date: 12/07/2021 CLINICAL DATA:  Altered mental status.  Trauma.  Fall. EXAM: CT HEAD WITHOUT CONTRAST CT CERVICAL SPINE WITHOUT CONTRAST TECHNIQUE: Multidetector CT imaging of the head and cervical spine was performed following the  standard protocol without intravenous contrast. Multiplanar CT image reconstructions of the cervical spine were also generated. RADIATION DOSE REDUCTION: This exam was performed according to the departmental dose-optimization program which includes automated exposure control, adjustment of the mA and/or kV according to patient size and/or use of iterative reconstruction technique. COMPARISON:  CT scan of the brain November 25, 2020. FINDINGS: CT HEAD FINDINGS Brain: No subdural, epidural, or subarachnoid hemorrhage. Multiple small cerebellar infarcts, unchanged. Cerebellum, brainstem, and basal cisterns otherwise normal. Remote bilateral occipital infarcts. White matter changes. Prominence of ventricles and sulci, stable. No acute cortical ischemia or infarct identified on today's study. No mass effect or midline shift. Vascular: No hyperdense vessel or unexpected calcification. Skull: Normal. Negative for fracture or focal lesion. Sinuses/Orbits: No acute finding. Other: None. CT CERVICAL SPINE FINDINGS Alignment: Trace retrolisthesis of C3 versus C2 and C4. Trace anterolisthesis of C7 versus T1. Trace anterolisthesis of T1 versus T2. No other malalignment. Skull base and vertebrae: No acute fracture. No primary bone lesion  or focal pathologic process. Soft tissues and spinal canal: No prevertebral fluid or swelling. No visible canal hematoma. Disc levels: Multilevel degenerative disc disease and facet degenerative changes. Upper chest: Negative. Other: No other abnormalities. IMPRESSION: 1. No acute intracranial abnormalities. 2. No fracture or traumatic malalignment in the cervical spine. 3. Multilevel degenerative changes. Electronically Signed   By: Gerome Sam III M.D.   On: 12/07/2021 14:41     Results are pending, will review when available.  Assessment and Plan: * Femur fracture, right (HCC) Patient with history of osteoporosis presenting with a right femur fracture after falling out of bed.  -  Orthopedic surgery consulted; appreciate their recommendations. Per note, plan for OR tomorrow 10/30 - N.p.o. after midnight - Heparin for DVT prophylaxis - Norco and Dilaudid for pain control - PT/OT postoperatively  Delirium Patient presenting with 1 week of increased altered mental status in the setting of underlying dementia.  I suspect this is due to acute fracture and opioid use.  -Delirium precautions  Leukocytosis WBC elevated 18.1, previously 18.7 approximately 6 days ago.  Likely partially explained by acute fracture, however patient will need continued outpatient follow-up to ensure resolution.  If remains elevated, would benefit from referral to hematology for further follow-up  - Repeat CBC in the a.m.  CAD (coronary artery disease) - Continue holding home Plavix - Restart home atorvastatin  Essential hypertension, benign - Restart home amlodipine   Advance Care Planning:   Code Status: Partial Code discussed with patient's daughter Madison Brown.  She states that she worries that CPR would be extremely traumatic both physically for Ms. Green and she would have a very hard time recovering.  Due to this, she would not like CPR or defibrillation to be used.  She is in agreement for intubation however.  Consults: Orthopedic surgery  Family Communication: Patient's daughter and daughter-in-law updated at bedside  Severity of Illness: The appropriate patient status for this patient is INPATIENT. Inpatient status is judged to be reasonable and necessary in order to provide the required intensity of service to ensure the patient's safety. The patient's presenting symptoms, physical exam findings, and initial radiographic and laboratory data in the context of their chronic comorbidities is felt to place them at high risk for further clinical deterioration. Furthermore, it is not anticipated that the patient will be medically stable for discharge from the hospital within 2 midnights of  admission.   * I certify that at the point of admission it is my clinical judgment that the patient will require inpatient hospital care spanning beyond 2 midnights from the point of admission due to high intensity of service, high risk for further deterioration and high frequency of surveillance required.*  Author: Verdene Lennert, MD 12/07/2021 7:06 PM  For on call review [email or web address].

## 2021-12-07 NOTE — Progress Notes (Signed)
Full consult note to follow tomorrow.  Called by ED staff. Imaging reviewed.  - Plan for surgery tomorrow for R hip IM nail, likely afternoon.  - NPO after midnight - Hold anticoagulation - Admit to Hospitalist team.

## 2021-12-07 NOTE — ED Notes (Signed)
Pt's O2 sat dropped to 83% after morphine, placed on 2L [place], O2 sat returned to 100%. Dr. Cinda Quest notified. Will continue to monitor pt.

## 2021-12-07 NOTE — ED Notes (Signed)
Advised nurse that patient has ready bed 

## 2021-12-07 NOTE — ED Notes (Signed)
Second 2mg  of 4mg  morphine dose given. 4mg  morphine given total.

## 2021-12-07 NOTE — ED Provider Notes (Signed)
[facility] Provider Note    Event Date/Time   First MD Initiated Contact with Patient 12/07/21 1526     (approximate)   History   Fall   HPI  SHAREA GUINTHER is a 88 y.o. female with a history of dementia and compression fracture found in the ER on 11/30/2021.  She was given a short course of tramadol.  This has since been used up.  Patient was found on the floor today.  When I go to see her she is lying in the bed saying I am not hurting anywhere and not hurting anywhere do not touch me and rubbing her right groin and hip area.  She had previously been to x-ray.  X-rays of her right hip and pelvis are negative.   ----------------------------------------- 4:10 PM on 12/07/2021 ----------------------------------------- Family has come just now.  They tell me that the patient had a CAL valve installed they think to replace her mitral valve 17 years ago it does not need any anticoagulation.  She has bad hypertension and has had 2 strokes with visual field cut.  The review of the old records show she has had a CABG as well. Further investigation shows she had a CABG x2 in 2009 and the aortic valve was replaced with a #23 [employer] bioprosthetic valve in 2009 sees Dr. Nehemiah Massed for those problems.  Physical Exam   Triage Vital Signs: ED Triage Vitals [12/07/21 1336]  Enc Vitals Group     BP 122/69     Pulse Rate (!) 101     Resp 19     Temp 98.3 F (36.8 C)     Temp Source Oral     SpO2 93 %     Weight      Height      Head Circumference      Peak Flow      Pain Score      Pain Loc      Pain Edu?      Excl. in [place]?     Most recent vital signs: Vitals:   12/07/21 2119 12/07/21 2219  BP: (!) 144/58 (!) 142/60  Pulse: (!) 108 (!) 106  Resp: 20 16  Temp: (!) 101.2 F (38.4 C) 100.1 F (37.8 C)  SpO2: 95% 97%    General: Awake, alert and in a lot of pain CV:  Good peripheral perfusion.  Heart regular rate and rhythm no audible murmurs although  it is very hard to tell as patient is trying to push me away and talking Resp:  Normal effort.  Lungs sound clear but see CV above. Abd:  No distention.  Really unable to assess because patient will not let me do so Patient lying in bed not moving her right leg.   ED Results / Procedures / Treatments   Labs (all labs ordered are listed, but only abnormal results are displayed) Labs Reviewed  COMPREHENSIVE METABOLIC PANEL - Abnormal; Notable for the following components:      Result Value   Sodium 134 (*)    Potassium 3.4 (*)    Chloride 97 (*)    Glucose, Bld 138 (*)    Creatinine, Ser 1.22 (*)    Calcium 8.3 (*)    Total Protein 6.4 (*)    Albumin 3.2 (*)    GFR, Estimated 42 (*)    All other components within normal limits  CBC WITH DIFFERENTIAL/PLATELET - Abnormal; Notable for the following components:   WBC  18.1 (*)    RBC 3.45 (*)    Hemoglobin 10.3 (*)    HCT 30.4 (*)    Neutro Abs 15.6 (*)    Monocytes Absolute 1.4 (*)    Abs Immature Granulocytes 0.16 (*)    All other components within normal limits  PROTIME-INR  APTT  URINALYSIS, ROUTINE W REFLEX MICROSCOPIC  CBC WITH DIFFERENTIAL/PLATELET  BASIC METABOLIC PANEL  TYPE AND SCREEN     EKG     RADIOLOGY CT head and neck are negative.  Etiology reads the films I reviewed them and interpreted them and agree. X-ray of the hip and pelvis and lumbar spine show the stable L1 compression fracture.  Patient with right hip fracture femoral neck.  Critical Care performed:   Procedures   MEDICATIONS ORDERED IN ED: Medications  HYDROcodone-acetaminophen (NORCO/VICODIN) 5-325 MG per tablet 1 tablet (has no administration in time range)  heparin injection 5,000 Units (5,000 Units Subcutaneous Given 12/07/21 2213)  HYDROmorphone (DILAUDID) injection 0.5 mg (has no administration in time range)  polyethylene glycol (MIRALAX / GLYCOLAX) packet 17 g (has no administration in time range)  tranexamic acid (CYKLOKAPRON)  IVPB 1,000 mg (1,000 mg Intravenous Not Given 12/07/21 2047)  amLODipine (NORVASC) tablet 10 mg (has no administration in time range)  atorvastatin (LIPITOR) tablet 40 mg (has no administration in time range)  morphine (PF) 4 MG/ML injection 4 mg (2 mg Intravenous Given 12/07/21 1540)  ondansetron (ZOFRAN) injection 4 mg ( Intravenous Canceled Entry 12/07/21 1545)  0.9 %  sodium chloride infusion (0 mLs Intravenous Paused 12/07/21 1713)  lactated ringers bolus 1,000 mL (1,000 mLs Intravenous New Bag/Given 12/07/21 1705)  ceFAZolin (ANCEF) IVPB 2g/100 mL premix (2 g Intravenous New Bag/Given 12/07/21 2036)  acetaminophen (TYLENOL) tablet 650 mg (325 mg Oral Given 12/07/21 2227)  acetaminophen (TYLENOL) 325 MG tablet (  Given 12/07/21 2227)     IMPRESSION / MDM / ASSESSMENT AND PLAN / ED COURSE  I reviewed the triage vital signs and the nursing notes. Gust patient with Dr. Posey Pronto orthopedics.  Patient has a right hip fracture.  We will get the patient admitted to medicine.  We will investigate to see if she has stopped the Plavix if not we will stop it now.  Doristine Bosworth is here he knows her very well.  He is calling the husband.  Patient's presentation is most consistent with acute presentation with potential threat to life or bodily function.  The patient is on the cardiac monitor to evaluate for evidence of arrhythmia and/or significant heart rate changes.  None have been seen    ----------------------------------------- 4:14 PM on 12/07/2021 ----------------------------------------- Patient earlier had O2 sats in the 99 range because she was yelling and screaming now after the morphine she is quiet and talking but her sats are very low.  We have put her on oxygen.  Sats are now 100% on 6 L FINAL CLINICAL IMPRESSION(S) / ED DIAGNOSES   Final diagnoses:  Fall, initial encounter  Closed right hip fracture, initial encounter [facility])     Rx / DC Orders   ED Discharge Orders     None         Note:  This document was prepared using Dragon voice recognition software and may include unintentional dictation errors.   Nena Polio, MD 12/07/21 [phone]

## 2021-12-07 NOTE — Assessment & Plan Note (Addendum)
Patient with history of osteoporosis presenting with a right femur fracture after falling out of bed.  - Orthopedic surgery consulted; appreciate their recommendations. Per note, plan for OR tomorrow 10/30 - N.p.o. after midnight - Heparin for DVT prophylaxis - Norco and Dilaudid for pain control - PT/OT postoperatively

## 2021-12-07 NOTE — Assessment & Plan Note (Signed)
Patient presenting with 1 week of increased altered mental status in the setting of underlying dementia.  I suspect this is due to acute fracture and opioid use.  -Delirium precautions

## 2021-12-07 NOTE — Assessment & Plan Note (Signed)
-   Continue holding home Plavix - Restart home atorvastatin

## 2021-12-07 NOTE — Progress Notes (Signed)
       CROSS COVER NOTE  NAME: Madison Brown MRN: [id number] DOB : 12-08-1929 ATTENDING PHYSICIAN: Jose Persia, MD    Date of Service   12/07/2021   HPI/Events of Note   Temp 101.70F, HR 108 Leukocytosis  Interventions   Assessment/Plan: Acetaminophen Lactic, Procalcitonin, Blood Cultures Cefepime, Vancomycin MRSA nasal swab      This document was prepared using Dragon voice recognition software and may include unintentional dictation errors.  Neomia Glass DNP, MBA, FNP-BC Nurse Practitioner [facility] Pager [phone]

## 2021-12-08 ENCOUNTER — Inpatient Hospital Stay: Payer: Medicare Other | Admitting: Anesthesiology

## 2021-12-08 ENCOUNTER — Other Ambulatory Visit: Payer: Self-pay

## 2021-12-08 ENCOUNTER — Encounter: Payer: Self-pay | Admitting: Internal Medicine

## 2021-12-08 ENCOUNTER — Encounter
Admission: EM | Disposition: A | Discharge status: Skilled Nursing Facility | Payer: Self-pay | Source: Home / Self Care | Attending: Student in an Organized Health Care Education/Training Program

## 2021-12-08 ENCOUNTER — Inpatient Hospital Stay: Payer: Medicare Other

## 2021-12-08 DIAGNOSIS — S72001A Fracture of unspecified part of neck of right femur, initial encounter for closed fracture: Secondary | ICD-10-CM | POA: Diagnosis not present

## 2021-12-08 DIAGNOSIS — I10 Essential (primary) hypertension: Secondary | ICD-10-CM

## 2021-12-08 DIAGNOSIS — Z8781 Personal history of (healed) traumatic fracture: Secondary | ICD-10-CM

## 2021-12-08 DIAGNOSIS — I2510 Atherosclerotic heart disease of native coronary artery without angina pectoris: Secondary | ICD-10-CM

## 2021-12-08 DIAGNOSIS — R410 Disorientation, unspecified: Secondary | ICD-10-CM | POA: Diagnosis not present

## 2021-12-08 DIAGNOSIS — S72101A Unspecified trochanteric fracture of right femur, initial encounter for closed fracture: Secondary | ICD-10-CM | POA: Diagnosis not present

## 2021-12-08 DIAGNOSIS — W19XXXA Unspecified fall, initial encounter: Secondary | ICD-10-CM

## 2021-12-08 HISTORY — PX: INTRAMEDULLARY (IM) NAIL INTERTROCHANTERIC: SHX5875

## 2021-12-08 LAB — URINALYSIS, ROUTINE W REFLEX MICROSCOPIC
Bilirubin Urine: NEGATIVE
Glucose, UA: NEGATIVE mg/dL
Hgb urine dipstick: NEGATIVE
Ketones, ur: NEGATIVE mg/dL
Leukocytes,Ua: NEGATIVE
Nitrite: NEGATIVE
Protein, ur: 30 mg/dL — AB
Specific Gravity, Urine: 1.019 (ref 1.005–1.030)
pH: 6 (ref 5.0–8.0)

## 2021-12-08 LAB — CBC WITH DIFFERENTIAL/PLATELET
Abs Immature Granulocytes: 0.11 10*3/uL — ABNORMAL HIGH (ref 0.00–0.07)
Basophils Absolute: 0.1 10*3/uL (ref 0.0–0.1)
Basophils Relative: 0 %
Eosinophils Absolute: 0.1 10*3/uL (ref 0.0–0.5)
Eosinophils Relative: 1 %
HCT: 27.3 % — ABNORMAL LOW (ref 36.0–46.0)
Hemoglobin: 9 g/dL — ABNORMAL LOW (ref 12.0–15.0)
Immature Granulocytes: 1 %
Lymphocytes Relative: 9 %
Lymphs Abs: 1.3 10*3/uL (ref 0.7–4.0)
MCH: 30.3 pg (ref 26.0–34.0)
MCHC: 33 g/dL (ref 30.0–36.0)
MCV: 91.9 fL (ref 80.0–100.0)
Monocytes Absolute: 1.3 10*3/uL — ABNORMAL HIGH (ref 0.1–1.0)
Monocytes Relative: 9 %
Neutro Abs: 12.3 10*3/uL — ABNORMAL HIGH (ref 1.7–7.7)
Neutrophils Relative %: 80 %
Platelets: 138 10*3/uL — ABNORMAL LOW (ref 150–400)
RBC: 2.97 MIL/uL — ABNORMAL LOW (ref 3.87–5.11)
RDW: 12.6 % (ref 11.5–15.5)
WBC: 15.1 10*3/uL — ABNORMAL HIGH (ref 4.0–10.5)
nRBC: 0 % (ref 0.0–0.2)

## 2021-12-08 LAB — BASIC METABOLIC PANEL
Anion gap: 7 (ref 5–15)
BUN: 16 mg/dL (ref 8–23)
CO2: 29 mmol/L (ref 22–32)
Calcium: 7.9 mg/dL — ABNORMAL LOW (ref 8.9–10.3)
Chloride: 99 mmol/L (ref 98–111)
Creatinine, Ser: 1.21 mg/dL — ABNORMAL HIGH (ref 0.44–1.00)
GFR, Estimated: 43 mL/min — ABNORMAL LOW (ref >60)
Glucose, Bld: 105 mg/dL — ABNORMAL HIGH (ref 70–99)
Potassium: 3.4 mmol/L — ABNORMAL LOW (ref 3.5–5.1)
Sodium: 135 mmol/L (ref 135–145)

## 2021-12-08 LAB — MRSA NEXT GEN BY PCR, NASAL: MRSA by PCR Next Gen: NOT DETECTED

## 2021-12-08 LAB — PROCALCITONIN
Procalcitonin: 0.88 ng/mL
Procalcitonin: 0.99 ng/mL

## 2021-12-08 LAB — LACTIC ACID, PLASMA
Lactic Acid, Venous: 1.1 mmol/L (ref 0.5–1.9)
Lactic Acid, Venous: 1.8 mmol/L (ref 0.5–1.9)

## 2021-12-08 SURGERY — FIXATION, FRACTURE, INTERTROCHANTERIC, WITH INTRAMEDULLARY ROD
Anesthesia: General | Site: Hip | Laterality: Right

## 2021-12-08 MED ORDER — METOCLOPRAMIDE HCL 5 MG/ML IJ SOLN: 5.0000 mg | Freq: Three times a day (TID) | INTRAMUSCULAR | Status: DC | PRN

## 2021-12-08 MED ORDER — PROPOFOL 10 MG/ML IV BOLUS
INTRAVENOUS | Status: DC | PRN
  Administered 2021-12-08: 20 mg via INTRAVENOUS
  Administered 2021-12-08: 40 mg via INTRAVENOUS
  Administered 2021-12-08: 20 mg via INTRAVENOUS

## 2021-12-08 MED ORDER — TRANEXAMIC ACID-NACL 1000-0.7 MG/100ML-% IV SOLN
INTRAVENOUS | Status: AC
  Administered 2021-12-08: 1000 mg via INTRAVENOUS
  Filled 2021-12-08: qty 100

## 2021-12-08 MED ORDER — BISACODYL 10 MG RE SUPP: 10.0000 mg | Freq: Every day | RECTAL | Status: DC | PRN

## 2021-12-08 MED ORDER — METHOCARBAMOL 500 MG PO TABS
500.0000 mg | ORAL_TABLET | Freq: Four times a day (QID) | ORAL | Status: DC | PRN
  Administered 2021-12-11: 500 mg via ORAL
  Filled 2021-12-08: qty 1

## 2021-12-08 MED ORDER — ACETAMINOPHEN 10 MG/ML IV SOLN: 500.0000 mg | Freq: Four times a day (QID) | INTRAVENOUS | Status: DC

## 2021-12-08 MED ORDER — TRANEXAMIC ACID-NACL 1000-0.7 MG/100ML-% IV SOLN: 1000.0000 mg | Freq: Once | INTRAVENOUS | Status: AC

## 2021-12-08 MED ORDER — DOCUSATE SODIUM 100 MG PO CAPS: 100.0000 mg | ORAL_CAPSULE | Freq: Two times a day (BID) | ORAL | Status: DC

## 2021-12-08 MED ORDER — LORAZEPAM BOLUS VIA INFUSION: 0.5000 mg | Freq: Once | INTRAVENOUS | Status: DC

## 2021-12-08 MED ORDER — KETOROLAC TROMETHAMINE 15 MG/ML IJ SOLN
7.5000 mg | Freq: Four times a day (QID) | INTRAMUSCULAR | Status: AC
  Administered 2021-12-08 – 2021-12-09 (×4): 7.5 mg via INTRAVENOUS
  Filled 2021-12-08 (×4): qty 1

## 2021-12-08 MED ORDER — PHENYLEPHRINE HCL-NACL 20-0.9 MG/250ML-% IV SOLN
INTRAVENOUS | Status: DC | PRN
  Administered 2021-12-08: 40 ug/min via INTRAVENOUS

## 2021-12-08 MED ORDER — LORAZEPAM 2 MG/ML IJ SOLN
0.5000 mg | Freq: Once | INTRAMUSCULAR | Status: AC
  Administered 2021-12-08: 0.5 mg via INTRAVENOUS
  Filled 2021-12-08: qty 1

## 2021-12-08 MED ORDER — FLEET ENEMA 7-19 GM/118ML RE ENEM: 1.0000 | ENEMA | Freq: Once | RECTAL | Status: DC | PRN

## 2021-12-08 MED ORDER — METRONIDAZOLE 500 MG/100ML IV SOLN
500.0000 mg | Freq: Two times a day (BID) | INTRAVENOUS | Status: DC
  Administered 2021-12-08: 500 mg via INTRAVENOUS
  Filled 2021-12-08 (×2): qty 100

## 2021-12-08 MED ORDER — METOCLOPRAMIDE HCL 5 MG PO TABS: 5.0000 mg | ORAL_TABLET | Freq: Three times a day (TID) | ORAL | Status: DC | PRN

## 2021-12-08 MED ORDER — ONDANSETRON HCL 4 MG/2ML IJ SOLN
INTRAMUSCULAR | Status: AC
  Filled 2021-12-08: qty 2

## 2021-12-08 MED ORDER — 0.9 % SODIUM CHLORIDE (POUR BTL) OPTIME
TOPICAL | Status: DC | PRN
  Administered 2021-12-08: 500 mL

## 2021-12-08 MED ORDER — VANCOMYCIN HCL IN DEXTROSE 1-5 GM/200ML-% IV SOLN
1000.0000 mg | Freq: Once | INTRAVENOUS | Status: AC
  Administered 2021-12-08: 1000 mg via INTRAVENOUS
  Filled 2021-12-08: qty 200

## 2021-12-08 MED ORDER — ACETAMINOPHEN 325 MG PO TABS
325.0000 mg | ORAL_TABLET | Freq: Four times a day (QID) | ORAL | Status: DC | PRN
  Administered 2021-12-10: 650 mg via ORAL
  Filled 2021-12-08: qty 2

## 2021-12-08 MED ORDER — PROPOFOL 10 MG/ML IV BOLUS
INTRAVENOUS | Status: AC
  Filled 2021-12-08: qty 20

## 2021-12-08 MED ORDER — PHENYLEPHRINE HCL (PRESSORS) 10 MG/ML IV SOLN
INTRAVENOUS | Status: DC | PRN
  Administered 2021-12-08 (×3): 80 ug via INTRAVENOUS

## 2021-12-08 MED ORDER — SODIUM CHLORIDE 0.9 % IV SOLN: INTRAVENOUS | Status: DC

## 2021-12-08 MED ORDER — LACTATED RINGERS IV SOLN: INTRAVENOUS | Status: DC

## 2021-12-08 MED ORDER — SODIUM CHLORIDE 0.9 % IV SOLN
2.0000 g | INTRAVENOUS | Status: DC
  Administered 2021-12-08: 2 g via INTRAVENOUS
  Filled 2021-12-08: qty 12.5

## 2021-12-08 MED ORDER — METHOCARBAMOL 1000 MG/10ML IJ SOLN: 500.0000 mg | Freq: Four times a day (QID) | INTRAVENOUS | Status: DC | PRN

## 2021-12-08 MED ORDER — ONDANSETRON HCL 4 MG/2ML IJ SOLN: 4.0000 mg | Freq: Four times a day (QID) | INTRAMUSCULAR | Status: DC | PRN

## 2021-12-08 MED ORDER — BUPIVACAINE LIPOSOME 1.3 % IJ SUSP
INTRAMUSCULAR | Status: DC | PRN
  Administered 2021-12-08: 50 mL via INTRAMUSCULAR

## 2021-12-08 MED ORDER — ACETAMINOPHEN 10 MG/ML IV SOLN
INTRAVENOUS | Status: AC
  Filled 2021-12-08: qty 100

## 2021-12-08 MED ORDER — ENSURE ENLIVE PO LIQD: 237.0000 mL | Freq: Two times a day (BID) | ORAL | Status: DC

## 2021-12-08 MED ORDER — MULTI-VITAMIN/MINERALS PO TABS: 1.0000 | ORAL_TABLET | Freq: Every day | ORAL | Status: DC

## 2021-12-08 MED ORDER — SENNOSIDES-DOCUSATE SODIUM 8.6-50 MG PO TABS: 1.0000 | ORAL_TABLET | Freq: Every evening | ORAL | Status: DC | PRN

## 2021-12-08 MED ORDER — ROCURONIUM BROMIDE 100 MG/10ML IV SOLN
INTRAVENOUS | Status: DC | PRN
  Administered 2021-12-08: 40 mg via INTRAVENOUS

## 2021-12-08 MED ORDER — ACETAMINOPHEN 10 MG/ML IV SOLN
INTRAVENOUS | Status: DC | PRN
  Administered 2021-12-08: 1000 mg via INTRAVENOUS

## 2021-12-08 MED ORDER — SUGAMMADEX SODIUM 200 MG/2ML IV SOLN
INTRAVENOUS | Status: DC | PRN
  Administered 2021-12-08: 200 mg via INTRAVENOUS

## 2021-12-08 MED ORDER — HYDROMORPHONE HCL 1 MG/ML IJ SOLN: 0.2000 mg | INTRAMUSCULAR | Status: DC | PRN

## 2021-12-08 MED ORDER — CLOPIDOGREL BISULFATE 75 MG PO TABS
75.0000 mg | ORAL_TABLET | Freq: Every day | ORAL | Status: DC
  Administered 2021-12-09 – 2021-12-11 (×3): 75 mg via ORAL
  Filled 2021-12-08 (×3): qty 1

## 2021-12-08 MED ORDER — LIDOCAINE HCL (CARDIAC) PF 100 MG/5ML IV SOSY
PREFILLED_SYRINGE | INTRAVENOUS | Status: DC | PRN
  Administered 2021-12-08: 40 mg via INTRAVENOUS

## 2021-12-08 MED ORDER — VANCOMYCIN HCL 750 MG/150ML IV SOLN: 750.0000 mg | INTRAVENOUS | Status: DC

## 2021-12-08 MED ORDER — OXYCODONE HCL 5 MG PO TABS
2.5000 mg | ORAL_TABLET | ORAL | Status: DC | PRN
  Administered 2021-12-09 – 2021-12-10 (×2): 5 mg via ORAL
  Filled 2021-12-08 (×2): qty 1

## 2021-12-08 MED ORDER — ENSURE ENLIVE PO LIQD
237.0000 mL | Freq: Three times a day (TID) | ORAL | Status: DC
  Administered 2021-12-09 – 2021-12-11 (×6): 237 mL via ORAL
  Filled 2021-12-08: qty 237

## 2021-12-08 MED ORDER — TRAMADOL HCL 50 MG PO TABS
50.0000 mg | ORAL_TABLET | Freq: Four times a day (QID) | ORAL | Status: DC | PRN
  Administered 2021-12-10: 50 mg via ORAL
  Filled 2021-12-08: qty 1

## 2021-12-08 MED ORDER — CEFAZOLIN SODIUM-DEXTROSE 2-4 GM/100ML-% IV SOLN
2.0000 g | Freq: Four times a day (QID) | INTRAVENOUS | Status: AC
  Administered 2021-12-08 – 2021-12-09 (×3): 2 g via INTRAVENOUS
  Filled 2021-12-08 (×3): qty 100

## 2021-12-08 MED ORDER — DEXAMETHASONE SODIUM PHOSPHATE 10 MG/ML IJ SOLN
INTRAMUSCULAR | Status: DC | PRN
  Administered 2021-12-08: 5 mg via INTRAVENOUS

## 2021-12-08 MED ORDER — CEFAZOLIN SODIUM-DEXTROSE 2-3 GM-%(50ML) IV SOLR
INTRAVENOUS | Status: DC | PRN
  Administered 2021-12-08: 2 g via INTRAVENOUS

## 2021-12-08 MED ORDER — ACETAMINOPHEN 500 MG PO TABS
1000.0000 mg | ORAL_TABLET | Freq: Three times a day (TID) | ORAL | Status: DC
  Administered 2021-12-09 – 2021-12-11 (×5): 1000 mg via ORAL
  Filled 2021-12-08 (×7): qty 2

## 2021-12-08 MED ORDER — ONDANSETRON HCL 4 MG/2ML IJ SOLN
INTRAMUSCULAR | Status: DC | PRN
  Administered 2021-12-08: 4 mg via INTRAVENOUS

## 2021-12-08 MED ORDER — ADULT MULTIVITAMIN W/MINERALS CH
1.0000 | ORAL_TABLET | Freq: Every day | ORAL | Status: DC
  Administered 2021-12-09 – 2021-12-11 (×3): 1 via ORAL
  Filled 2021-12-08 (×3): qty 1

## 2021-12-08 MED ORDER — FENTANYL CITRATE (PF) 100 MCG/2ML IJ SOLN
INTRAMUSCULAR | Status: DC | PRN
  Administered 2021-12-08 (×2): 25 ug via INTRAVENOUS

## 2021-12-08 MED ORDER — LIDOCAINE HCL (PF) 2 % IJ SOLN
INTRAMUSCULAR | Status: AC
  Filled 2021-12-08: qty 5

## 2021-12-08 MED ORDER — FENTANYL CITRATE (PF) 100 MCG/2ML IJ SOLN
INTRAMUSCULAR | Status: AC
  Filled 2021-12-08: qty 2

## 2021-12-08 MED ORDER — ONDANSETRON HCL 4 MG PO TABS: 4.0000 mg | ORAL_TABLET | Freq: Four times a day (QID) | ORAL | Status: DC | PRN

## 2021-12-08 MED ORDER — ROCURONIUM BROMIDE 10 MG/ML (PF) SYRINGE
PREFILLED_SYRINGE | INTRAVENOUS | Status: AC
  Filled 2021-12-08: qty 10

## 2021-12-08 MED ORDER — ASPIRIN 325 MG PO TBEC
325.0000 mg | DELAYED_RELEASE_TABLET | Freq: Every day | ORAL | Status: DC
  Administered 2021-12-09 – 2021-12-11 (×3): 325 mg via ORAL
  Filled 2021-12-08 (×3): qty 1

## 2021-12-08 MED ORDER — FENTANYL CITRATE (PF) 100 MCG/2ML IJ SOLN: 25.0000 ug | INTRAMUSCULAR | Status: DC | PRN

## 2021-12-08 SURGICAL SUPPLY — 43 items
BIT DRILL INTERTAN LAG SCREW (BIT) IMPLANT
BIT DRILL LONG 4.0 (BIT) IMPLANT
BLADE SURG 15 STRL LF DISP TIS (BLADE) ×1 IMPLANT
BLADE SURG 15 STRL SS (BLADE) ×1
CHLORAPREP W/TINT 26 (MISCELLANEOUS) ×1 IMPLANT
DRAPE 3/4 80X56 (DRAPES) ×1 IMPLANT
DRAPE SURG 17X11 SM STRL (DRAPES) ×2 IMPLANT
DRAPE U-SHAPE 47X51 STRL (DRAPES) ×2 IMPLANT
DRILL BIT LONG 4.0 (BIT) ×1
DRSG OPSITE POSTOP 3X4 (GAUZE/BANDAGES/DRESSINGS) ×3 IMPLANT
ELECT REM PT RETURN 9FT ADLT (ELECTROSURGICAL) ×1
ELECTRODE REM PT RTRN 9FT ADLT (ELECTROSURGICAL) ×1 IMPLANT
GLOVE BIOGEL PI IND STRL 8 (GLOVE) ×1 IMPLANT
GLOVE SURG SYN 7.5  E (GLOVE) ×1
GLOVE SURG SYN 7.5 E (GLOVE) ×1 IMPLANT
GLOVE SURG SYN 7.5 PF PI (GLOVE) ×1 IMPLANT
GOWN STRL REUS W/ TWL LRG LVL3 (GOWN DISPOSABLE) ×1 IMPLANT
GOWN STRL REUS W/ TWL XL LVL3 (GOWN DISPOSABLE) ×1 IMPLANT
GOWN STRL REUS W/TWL LRG LVL3 (GOWN DISPOSABLE) ×1
GOWN STRL REUS W/TWL XL LVL3 (GOWN DISPOSABLE) ×1
GUIDE PIN 3.2X343 (PIN) ×2
GUIDE PIN 3.2X343MM (PIN) ×2
KIT PATIENT CARE HANA TABLE (KITS) ×1 IMPLANT
KIT TURNOVER KIT A (KITS) ×1 IMPLANT
MANIFOLD NEPTUNE II (INSTRUMENTS) ×1 IMPLANT
MAT ABSORB  FLUID 56X50 GRAY (MISCELLANEOUS) ×2
MAT ABSORB FLUID 56X50 GRAY (MISCELLANEOUS) ×2 IMPLANT
NAIL TRIGEN INTERTAN 10X18CM (Nail) IMPLANT
NDL FILTER BLUNT 18X1 1/2 (NEEDLE) ×1 IMPLANT
NEEDLE FILTER BLUNT 18X1 1/2 (NEEDLE) ×1 IMPLANT
NEEDLE HYPO 22GX1.5 SAFETY (NEEDLE) ×1 IMPLANT
NS IRRIG 1000ML POUR BTL (IV SOLUTION) ×1 IMPLANT
PACK HIP COMPR (MISCELLANEOUS) ×1 IMPLANT
PIN GUIDE 3.2X343MM (PIN) IMPLANT
SCREW LAG COMPR KIT 90/85 (Screw) IMPLANT
SCREW TRIGEN LOW PROF 5.0X32.5 (Screw) IMPLANT
STAPLER SKIN PROX 35W (STAPLE) ×1 IMPLANT
SUT VIC AB 2-0 CT2 27 (SUTURE) ×1 IMPLANT
SYR 10ML LL (SYRINGE) ×1 IMPLANT
SYR 30ML LL (SYRINGE) ×1 IMPLANT
TAPE CLOTH 3X10 WHT NS LF (GAUZE/BANDAGES/DRESSINGS) ×2 IMPLANT
TRAP FLUID SMOKE EVACUATOR (MISCELLANEOUS) ×1 IMPLANT
WATER STERILE IRR 500ML POUR (IV SOLUTION) ×1 IMPLANT

## 2021-12-08 NOTE — Op Note (Signed)
DATE OF SURGERY: 12/08/2021  PREOPERATIVE DIAGNOSIS: Right intertrochanteric hip fracture  POSTOPERATIVE DIAGNOSIS: Right intertrochanteric hip fracture  PROCEDURE: Intramedullary nailing of Right femur with cephalomedullary device  SURGEON: Cato Mulligan, MD  ANESTHESIA: Gen  EBL: 50 cc  IVF: per anesthesia record  COMPONENTS:  [employer] Trigen Intertan Short Nail: 10x155mm; 24mm lag screw with 4mm compression screw; 5x 32.5 mm distal cortical interlocking screw  INDICATIONS: Madison Brown is a 88 y.o. female who sustained an intertrochanteric fracture after a fall. Risks and benefits of intramedullary nailing were explained to the patient and/or family . Risks include but are not limited to bleeding, infection, injury to tissues, nerves, vessels, nonunion/malunion, hardware failure, limb length discrepancy/hip rotation mismatch and risks of anesthesia. The patient and/or family understand these risks, have completed an informed consent, and wish to proceed.   PROCEDURE:  The patient was brought into the operating room. After administering anesthesia, the patient was placed in the supine position on the [place] table. The uninjured leg was placed in an extended position while the injured lower extremity was placed in longitudinal traction. The fracture was reduced using longitudinal traction and internal rotation. The adequacy of reduction was verified fluoroscopically in AP and lateral projections and found to be acceptable. The lateral aspect of the hip and thigh were prepped with ChloraPrep solution before being draped sterilely. Preoperative IV antibiotics were administered. A timeout was performed to verify the appropriate surgical site, patient, and procedure.    The greater trochanter was identified and an approximately 6 cm incision was made about 3 fingerbreadths above the tip of the greater trochanter. The incision was carried down through the subcutaneous tissues to expose  the gluteal fascia. This was split the length of the incision, providing access to the tip of the trochanter. Under fluoroscopic guidance, a guidewire was drilled through the tip of the trochanter into the proximal metaphysis to the level of the lesser trochanter. After verifying its position fluoroscopically in AP and lateral projections, it was overreamed with the opening reamer to the level of the lesser trochanter. The nail was selected and advanced to the appropriate depth as verified fluoroscopically.    The guide system for the lag screw was positioned and advanced through an approximately 5cm incision over the lateral aspect of the proximal femur. The guidewire was drilled up through the femoral nail and into the femoral neck to rest within 5 mm of subchondral bone. After verifying its position in the femoral neck and head in both AP and lateral projections, the guidewire was measured and appropriate sized lag screw was selected.  The channel for the compression screw was drilled and antirotation bar was placed.  Lag screw was drilled and placed in appropriate position.  Compression screw was then placed.  Appropriate compression was achieved.  The set screw was locked in place. Again, the adequacy of hardware position and fracture reduction was verified fluoroscopically in AP and lateral projections.   Attention was then turned to the distal interlocking screw in the diaphysis. Using a targeted assembly, a stab incision was made and hole was drilled through the nail. An interlocking screw was placed with excellent purchase.  Appropriate screw position was verified fluoroscopically in AP and lateral projections.   The wounds were irrigated thoroughly with sterile saline solution. Local anesthetic was injected into the wounds. The subcutaneous tissues were closed using 2-0 Vicryl interrupted sutures. The skin was closed using staples. Sterile occlusive dressings were applied to all wounds. The  patient  was then transferred to the recovery room in satisfactory condition.   POSTOPERATIVE PLAN: The patient will be WBAT on the operative extremity. Can start DVT ppx on POD#1 if no further procedures planned. Perioperative IV antibiotics x 24 hours. PT/OT on POD#1.

## 2021-12-08 NOTE — H&P (Signed)
H&P reviewed. No significant changes noted.  

## 2021-12-08 NOTE — Anesthesia Preprocedure Evaluation (Addendum)
Anesthesia Evaluation  Patient identified by MRN, date of birth, ID band Patient awake and Patient confused    Reviewed: Allergy & Precautions, NPO status , Patient's Chart, lab work & pertinent test results  History of Anesthesia Complications Negative for: history of anesthetic complications  Airway Mallampati: III  TM Distance: <3 FB Neck ROM: full    Dental  (+) Chipped, Missing, Edentulous Upper   Pulmonary neg pulmonary ROS,    Pulmonary exam normal        Cardiovascular hypertension, + CAD and + Past MI  Normal cardiovascular exam+ Valvular Problems/Murmurs      Neuro/Psych PSYCHIATRIC DISORDERS Dementia TIACVA    GI/Hepatic negative GI ROS, Neg liver ROS,   Endo/Other  negative endocrine ROS  Renal/GU      Musculoskeletal   Abdominal   Peds  Hematology negative hematology ROS (+)   Anesthesia Other Findings Past Medical History: No date: Coronary artery disease     Comment:  Dr. Nehemiah Massed  Past Surgical History: 2009: CORONARY ARTERY BYPASS GRAFT     Comment:  Dr. Cheree Ditto at [facility] 12/02/2021: IR RADIOLOGIST EVAL & MGMT No date: VAGINAL DELIVERY     Comment:  3  BMI    Body Mass Index: 16.25 kg/m      Reproductive/Obstetrics negative OB ROS                            Anesthesia Physical Anesthesia Plan  ASA: 3  Anesthesia Plan: General ETT   Post-op Pain Management:    Induction: Intravenous  PONV Risk Score and Plan: Ondansetron, Dexamethasone, Midazolam and Treatment may vary due to age or medical condition  Airway Management Planned: Oral ETT  Additional Equipment:   Intra-op Plan:   Post-operative Plan: Extubation in OR  Informed Consent: I have reviewed the patients History and Physical, chart, labs and discussed the procedure including the risks, benefits and alternatives for the proposed anesthesia with the patient or authorized representative who has  indicated his/her understanding and acceptance.   Patient has DNR.  Discussed DNR with power of attorney and Suspend DNR.   Dental Advisory Given  Plan Discussed with: Anesthesiologist, CRNA and Surgeon  Anesthesia Plan Comments: ( History and phone consent from the patients daughter Mellody Life at [phone] who says that she has been signing the patients consents  Daughter consented for risks of anesthesia including but not limited to:  - adverse reactions to medications - damage to eyes, teeth, lips or other oral mucosa - nerve damage due to positioning  - sore throat or hoarseness - Damage to heart, brain, nerves, lungs, other parts of body or loss of life  She voiced understanding.)       Anesthesia Quick Evaluation

## 2021-12-08 NOTE — TOC Initial Note (Signed)
Transition of Care [facility]) - Initial/Assessment Note    Patient Details  Name: Madison Brown MRN: [id number] Date of Birth: 08/30/1929  [facility] [place]) CM/SW Contact:    Laurena Slimmer, RN Phone Number: 12/08/2021, 11:25 PM  Clinical Narrative:                 Planned vertebroplasty 10/31. PT/ OT to evaluate. Will reassess for disposition.         Patient Goals and CMS Choice        Expected Discharge Plan and Services                                                Prior Living Arrangements/Services                       Activities of Daily Living      Permission Sought/Granted                  Emotional Assessment              Admission diagnosis:  Closed right hip fracture ([place]) [S72.001A] Fall, initial encounter [W19.XXXA] Closed right hip fracture, initial encounter ([place]) [S72.001A] Femur fracture, right ([place]) [S72.91XA] Patient Active Problem List   Diagnosis Date Noted   Closed right hip fracture, initial encounter [facility])    Fall    History of vertebral fracture    Femur fracture, right ([place]) 12/07/2021   Delirium 12/07/2021   Leukocytosis 12/07/2021   Urinary tract infection without hematuria    Protein-calorie malnutrition, severe ([place]) 12/04/2020   Hx of CABG 11/26/2020   S/P aortic valve replacement with bioprosthetic valve 11/26/2020   Dementia ([place]) 11/26/2020   TIA (transient ischemic attack) 11/25/2020   Acute CVA (cerebrovascular accident) ([place]) 11/25/2020   COVID-19 virus infection 11/25/2020   Slurred speech 11/25/2020   Underweight 02/23/2017   Essential hypertension, benign 03/30/2013   Other and unspecified hyperlipidemia 03/30/2013   Encounter for preventive health examination 10/06/2012   CAD (coronary artery disease) 06/22/2011   PCP:  Gladstone Lighter, MD Pharmacy:   [facility] [address] [place] [address] Phone: [phone]  Fax: [place], [place] - [address] [place] [address] Phone: [phone] Fax: [place], [place] - [place] [address] Phone: [phone] Fax: [phone]     Social Determinants of Health (SDOH) Interventions    Readmission Risk Interventions     No data to display

## 2021-12-08 NOTE — Progress Notes (Signed)
Request received for previously approved outpatient kyphoplasty procedure to be done inpatient while patient is admitted for right intertrochanteric hip fracture surgery.  Patient's daughter Silva Bandy was contacted and advised that previously approved outpatient kyphoplasty procedure would have to be resubmitted for insurance authorization to be performed as inpatient.  Also discussed concern for patient tolerance of lying prone and motionless on IR table for kyphoplasty procedure so soon after hip fracture surgery.  Patient's daughter was in agreement to hold inpatient request for kyphoplasty at this time to see how her mother progresses.  Once patient has recovery and can tolerate lying prone, they will contact [facility] to schedule previously approved outpatient kyphoplasty procedure.  Dr. Doristine Mango made aware of conversation and decision.    Narda Rutherford, AGNP-BC 12/08/2021, 3:42 PM

## 2021-12-08 NOTE — Progress Notes (Addendum)
Pharmacy Antibiotic Note  Madison Brown is a 88 y.o. female admitted on 12/07/2021 with fever and possible infection. Experience fall at home.  Unclear source. Pharmacy has been consulted for vancomycin and cefepime dosing.  Plan: Vancomycin 1,000 mg loading dose  Vancomycin 750 mg every 48 hours Goal AUC 400-600 Estimated AUC 593, Cmin 14.6 Used TBW, Vd 0.72, Scr 1.22  Cefepime 2 grams every 24 hours  Follow up renal function, cultures, and identification of source     Temp (24hrs), Avg:99.2 F (37.3 C), Min:98.1 F (36.7 C), Max:101.2 F (38.4 C)  Recent Labs  Lab 12/07/21 1838 12/08/21 0015  WBC 18.1*  --   CREATININE 1.22*  --   LATICACIDVEN  --  1.8    CrCl 20.1 mL/min  No Known Allergies  Antimicrobials this admission: vancomycin 10/30 >>  cefepime 10/30 >>   Dose adjustments this admission: N/a  Microbiology results: 10/31 BCx: in process 10/31 MRSA PCR: ordered  Thank you for allowing pharmacy to be a part of this patient's care.  Wynelle Cleveland 12/08/2021 1:25 AM

## 2021-12-08 NOTE — Plan of Care (Signed)
  Problem: Nutrition: Goal: Adequate nutrition will be maintained Outcome: Progressing   Problem: Coping: Goal: Level of anxiety will decrease Outcome: Progressing   Problem: Pain Managment: Goal: General experience of comfort will improve Outcome: Progressing   Problem: Skin Integrity: Goal: Risk for impaired skin integrity will decrease Outcome: Progressing   Problem: Pain Managment: Goal: General experience of comfort will improve Outcome: Progressing

## 2021-12-08 NOTE — Consult Note (Signed)
ORTHOPAEDIC CONSULTATION  REQUESTING PHYSICIAN: Leeroy Bock, MD  Chief Complaint:   R hip pain  History of Present Illness: History obtained from patient family, review of chart, and discussion with other medical providers due to underlying dementia.  SMRITI BARKOW is a 88 y.o. female with significant dementia who was found down at home yesterday.  The patient noted immediate hip pain and inability to ambulate.  The patient ambulates with a walker at baseline.  The patient lives at home with her husband.  Pain is worse with any sort of movement.  X-rays in the emergency department show a right intertrochanteric hip fracture.  Of note, she was evaluated in the ED ~1 week ago after sustaining an L1 compression fracture. She was schedule for kyphoplasty tomorrow. Plavix has been held in advance of this expected procedure.   She also has a history of prior CABG >15 years ago and recent strokes for which she is on Plavix. Sh  Past Medical History:  Diagnosis Date   Coronary artery disease    Dr. Gwen Pounds   Past Surgical History:  Procedure Laterality Date   CORONARY ARTERY BYPASS GRAFT  2009   Dr. Zebedee Iba at [facility]   IR RADIOLOGIST EVAL & MGMT  12/02/2021   VAGINAL DELIVERY     3   Social History   Socioeconomic History   Marital status: Married    Spouse name: Not on file   Number of children: Not on file   Years of education: Not on file   Highest education level: Not on file  Occupational History   Not on file  Tobacco Use   Smoking status: Never   Smokeless tobacco: Never  Substance and Sexual Activity   Alcohol use: No   Drug use: No   Sexual activity: Not on file  Other Topics Concern   Not on file  Social History Narrative   Lives with husband in [place]. No pets.   Work - retired Clinical research associate      Diet - healthy diet   Exercise - walks 2 miles per day   Social Determinants of Manufacturing engineer Strain: Not on file  Food Insecurity: Not on file  Transportation Needs: Not on file  Physical Activity: Not on file  Stress: Not on file  Social Connections: Not on file   History reviewed. No pertinent family history. No Known Allergies Prior to Admission medications   Medication Sig Start Date End Date Taking? Authorizing Provider  amLODipine (NORVASC) 10 MG tablet Take 10 mg by mouth daily.   Yes [provider]  atorvastatin (LIPITOR) 40 MG tablet Take 40 mg by mouth daily.   Yes [provider]  Multiple Vitamins-Minerals (MULTIVITAMIN WITH MINERALS) tablet Take 1 tablet by mouth daily.   Yes [provider]  traMADol (ULTRAM) 50 MG tablet Take 1 tablet (50 mg total) by mouth every 6 (six) hours as needed. 12/01/21  Yes Minna Antis, MD  clopidogrel (PLAVIX) 75 MG tablet Take 75 mg by mouth daily.    [provider]  feeding supplement (ENSURE ENLIVE / ENSURE PLUS) LIQD Take 237 mLs by mouth 2 (two) times daily between meals. 12/06/20   Delfino Lovett, MD   Recent Labs    12/07/21 1838 12/08/21 0349  WBC 18.1* 15.1*  HGB 10.3* 9.0*  HCT 30.4* 27.3*  PLT 156 138*  K 3.4* 3.4*  CL 97* 99  CO2 28 29  BUN 18 16  CREATININE 1.22* 1.21*  GLUCOSE 138* 105*  CALCIUM 8.3* 7.9*  INR 1.2  --    DG Chest 1 View  Result Date: 12/07/2021 CLINICAL DATA:  Unwitnessed fall.  Dementia. EXAM: CHEST  1 VIEW COMPARISON:  11/25/2020 FINDINGS: Prior CABG. Atherosclerotic calcification of the aortic arch. Upper normal heart size. The lungs appear clear.  Bony demineralization is present. IMPRESSION: 1. No active cardiopulmonary disease is radiographically apparent. 2. Prior CABG. 3. Bony demineralization. Electronically Signed   By: Gaylyn Rong M.D.   On: 12/07/2021 15:12   DG Hip Unilat With Pelvis 2-3 Views Left  Result Date: 12/07/2021 CLINICAL DATA:  Unwitnessed fall, hip pain, dementia. EXAM: DG HIP (WITH OR WITHOUT  PELVIS) 2-3V LEFT COMPARISON:  CT pelvis 12/01/2021 FINDINGS: Bony demineralization. No left hip fracture is identified. Atherosclerosis noted. Patient has a known right basicervical femoral assessed with dedicated right hip imaging. IMPRESSION: 1. No LEFT hip fracture identified, the patient has a known right basicervical femoral neck fracture. 2. Bony demineralization. 3. Atherosclerosis. Electronically Signed   By: Gaylyn Rong M.D.   On: 12/07/2021 15:10   DG Lumbar Spine Complete  Result Date: 12/07/2021 CLINICAL DATA:  Fall, right hip pain. Known L1 compression fracture. EXAM: LUMBAR SPINE - COMPLETE 4+ VIEW COMPARISON:  Lumbar CT 12/01/2021 FINDINGS: Bony demineralization. Bilateral degenerative facet arthropathy at L4-5 at L5-S1. Stable 60% compression fracture at L1 with some middle column involvement based on the mild posterior bony retropulsion which, like love before, amounts to about 0.4 cm. Stable grade 1 degenerative anterolisthesis L4-5. Stable loss of intervertebral disc height at L4-5 and L5-S1. No new fracture is readily apparent. Atherosclerosis is present, including aortoiliac atherosclerotic disease. IMPRESSION: 1. Stable 60% compression fracture at L1 with some middle column involvement. 2. Stable grade 1 degenerative anterolisthesis at L4-5. 3. Bony demineralization. 4. Aortoiliac atherosclerotic disease. Electronically Signed   By: Gaylyn Rong M.D.   On: 12/07/2021 15:08   DG Hip Unilat  With Pelvis 2-3 Views Right  Result Date: 12/07/2021 CLINICAL DATA:  Unwitnessed fall.  Dementia.  Right hip pain. EXAM: DG HIP (WITH OR WITHOUT PELVIS) 2-3V RIGHT COMPARISON:  CT pelvis 12/01/2021 FINDINGS: New basicervical right femoral neck fracture with substantial varus angulation. Questionable extension into the greater trochanter. Bony demineralization. Atherosclerotic vascular calcifications. IMPRESSION: 1. New basicervical right femoral neck fracture with substantial varus  angulation. Questionable extension into the greater trochanter. 2. Bony demineralization. Electronically Signed   By: Gaylyn Rong M.D.   On: 12/07/2021 15:06   CT Head Wo Contrast  Result Date: 12/07/2021 CLINICAL DATA:  Altered mental status.  Trauma.  Fall. EXAM: CT HEAD WITHOUT CONTRAST CT CERVICAL SPINE WITHOUT CONTRAST TECHNIQUE: Multidetector CT imaging of the head and cervical spine was performed following the standard protocol without intravenous contrast. Multiplanar CT image reconstructions of the cervical spine were also generated. RADIATION DOSE REDUCTION: This exam was performed according to the departmental dose-optimization program which includes automated exposure control, adjustment of the mA and/or kV according to patient size and/or use of iterative reconstruction technique. COMPARISON:  CT scan of the brain November 25, 2020. FINDINGS: CT HEAD FINDINGS Brain: No subdural, epidural, or subarachnoid hemorrhage. Multiple small cerebellar infarcts, unchanged. Cerebellum, brainstem, and basal cisterns otherwise normal. Remote bilateral occipital infarcts. White matter changes. Prominence of ventricles and sulci, stable. No acute cortical ischemia or infarct identified on today's study. No mass effect or midline shift. Vascular: No hyperdense vessel or unexpected calcification. Skull: Normal. Negative for fracture or focal lesion. Sinuses/Orbits: No acute  finding. Other: None. CT CERVICAL SPINE FINDINGS Alignment: Trace retrolisthesis of C3 versus C2 and C4. Trace anterolisthesis of C7 versus T1. Trace anterolisthesis of T1 versus T2. No other malalignment. Skull base and vertebrae: No acute fracture. No primary bone lesion or focal pathologic process. Soft tissues and spinal canal: No prevertebral fluid or swelling. No visible canal hematoma. Disc levels: Multilevel degenerative disc disease and facet degenerative changes. Upper chest: Negative. Other: No other abnormalities. IMPRESSION: 1.  No acute intracranial abnormalities. 2. No fracture or traumatic malalignment in the cervical spine. 3. Multilevel degenerative changes. Electronically Signed   By: Gerome Sam III M.D.   On: 12/07/2021 14:41   CT Cervical Spine Wo Contrast  Result Date: 12/07/2021 CLINICAL DATA:  Altered mental status.  Trauma.  Fall. EXAM: CT HEAD WITHOUT CONTRAST CT CERVICAL SPINE WITHOUT CONTRAST TECHNIQUE: Multidetector CT imaging of the head and cervical spine was performed following the standard protocol without intravenous contrast. Multiplanar CT image reconstructions of the cervical spine were also generated. RADIATION DOSE REDUCTION: This exam was performed according to the departmental dose-optimization program which includes automated exposure control, adjustment of the mA and/or kV according to patient size and/or use of iterative reconstruction technique. COMPARISON:  CT scan of the brain November 25, 2020. FINDINGS: CT HEAD FINDINGS Brain: No subdural, epidural, or subarachnoid hemorrhage. Multiple small cerebellar infarcts, unchanged. Cerebellum, brainstem, and basal cisterns otherwise normal. Remote bilateral occipital infarcts. White matter changes. Prominence of ventricles and sulci, stable. No acute cortical ischemia or infarct identified on today's study. No mass effect or midline shift. Vascular: No hyperdense vessel or unexpected calcification. Skull: Normal. Negative for fracture or focal lesion. Sinuses/Orbits: No acute finding. Other: None. CT CERVICAL SPINE FINDINGS Alignment: Trace retrolisthesis of C3 versus C2 and C4. Trace anterolisthesis of C7 versus T1. Trace anterolisthesis of T1 versus T2. No other malalignment. Skull base and vertebrae: No acute fracture. No primary bone lesion or focal pathologic process. Soft tissues and spinal canal: No prevertebral fluid or swelling. No visible canal hematoma. Disc levels: Multilevel degenerative disc disease and facet degenerative changes. Upper  chest: Negative. Other: No other abnormalities. IMPRESSION: 1. No acute intracranial abnormalities. 2. No fracture or traumatic malalignment in the cervical spine. 3. Multilevel degenerative changes. Electronically Signed   By: Gerome Sam III M.D.   On: 12/07/2021 14:41     Positive ROS: All other systems have been reviewed and were otherwise negative with the exception of those mentioned in the HPI and as above.  Physical Exam: BP (!) 178/72   Pulse 99   Temp 99.1 F (37.3 C)   Resp 15   Ht 5\' 3"  (1.6 m)   Wt 41.6 kg   SpO2 98%   BMI 16.25 kg/m  General:  Alert, no acute distress, A&O x 1 to name only   Orthopedic Exam:  RLE: 5/5 DF/PF/EHL SILT s/s/t/sp/dp distr Foot wwp +Log roll/axial load   X-rays:  As above: R intertrochanteric hip fracture  Assessment/Plan: NYKIRA REDDIX is a 88 y.o. female with a R intertrochanteric hip fracture   1. I discussed the various treatment options including both surgical and non-surgical management of the fracture with the patient and/or family (medical PoA). We discussed the high risk of perioperative complications due to patient's age, dementia, and other co-morbidities. After discussion of risks, benefits, and alternatives to surgery, the family and/or patient were in agreement to proceed with surgery.  The goals of surgery would be to provide adequate pain relief and  allow for mobilization. Plan for surgery is R hip cephalomedullary nailing today, 12/08/2021. 2. NPO until OR 3. Hold anticoagulation in advance of OR 4. Recommend coordination with IR to see if kyphoplasty can be performed while inpatient and off anticoagulation.       Leim Fabry   12/08/2021 1:16 PM

## 2021-12-08 NOTE — Progress Notes (Signed)
PROGRESS NOTE  Madison Brown    DOB: Jul 09, 1929, 88 y.o.  WUJ:[id number]    Code Status: Partial Code   DOA: 12/07/2021   LOS: 1   Brief hospital course  Madison Brown is a 88 y.o. female with a PMH significant for CAD s/p CABG, CVA, TAVR, hypertension, hyperlipidemia, dementia.  They presented from home where she lives with her husband to the ED on 12/07/2021 with fall that resulted in hip fracture but already had a vertebral compression fracture which was due for vertebroplasty 10/31.  In the ED, it was found that they had normotensive at 122/69 with heart rate of 101.  She was saturating at 93% on room air.  After receiving morphine, patient's oxygen levels decreased to 87% and she was placed on 2 L nasal cannula. No oxygen requirements at baselin.  Significant findings included Bassi cervical right femoral neck fracture with substantial varus angulation. Orthopedic surgery was consulted.   They were initially treated with analgesia.   Patient was admitted to medicine service for further workup and management of chronic conditions as outlined in detail below.  12/08/21 -stable  Assessment & Plan  Principal Problem:   Femur fracture, right (HCC) Active Problems:   Delirium   Leukocytosis   CAD (coronary artery disease)   Essential hypertension, benign  Acute Femur fracture, right [facility]) - Orthopedic surgery consulted; appreciate their recommendations.   - fixation 10/30 - Norco and Dilaudid for pain control - PT/OT postoperatively - scheduled IV tylenol as she was wincing with movement but unable to verbalize any requests for pain meds in current state  Recent L1 vertebral compression fracture- scheduled for vertebroplasty 10/31 prior to current injury - consult IR if can be done while inpatient   Delirium -Delirium precautions - palliative consult, TOC engaged for placement as she will unlikely be able to go home in acute setting   CAD- - Continue holding home Plavix  for surgery - Restart home atorvastatin   Essential hypertension, benign - Restart home amlodipine  Body mass index is 16.25 kg/m.  VTE ppx: heparin injection 5,000 Units Start: 12/07/21 1630   Diet:     Diet   Diet NPO time specified   Consultants: Ortho surgery IR Subjective 12/08/21    Pt reports nothing. She is aware when I speak to her but unable to verbalize anything. Family member at bedside says that this is not her baseline.    Objective   Vitals:   12/07/21 2219 12/08/21 0116 12/08/21 0152 12/08/21 0358  BP: (!) 142/60 (!) 122/51  (!) 149/54  Pulse: (!) 106 81  (!) 104  Resp: 16 14  16   Temp: 100.1 F (37.8 C) 98.1 F (36.7 C)  (!) 97.5 F (36.4 C)  TempSrc: Oral   Axillary  SpO2: 97% 95%  95%  Weight:   41.6 kg   Height:   5\' 3"  (1.6 m)     Intake/Output Summary (Last 24 hours) at 12/08/2021 0750 Last data filed at 12/08/2021 0430 Gross per 24 hour  Intake 0 ml  Output 250 ml  Net -250 ml   Filed Weights   12/08/21 0152  Weight: 41.6 kg     Physical Exam:  General: awake, alert, NAD Respiratory: normal respiratory effort. Cardiovascular: quick capillary refill, normal S1/S2, RRR, no JVD, murmurs Gastrointestinal: soft, NT, ND Nervous: A&O x3. no gross focal neurologic deficits, normal speech Extremities: not moving right leg. Has wincing when her legs are moved.  Skin: dry, intact,  normal temperature, normal color. No rashes, lesions or ulcers on exposed skin Psychiatry: flat  Labs   I have personally reviewed the following labs and imaging studies CBC    Component Value Date/Time   WBC 15.1 (H) 12/08/2021 0349   RBC 2.97 (L) 12/08/2021 0349   HGB 9.0 (L) 12/08/2021 0349   HGB 12.1 02/23/2014 1040   HCT 27.3 (L) 12/08/2021 0349   HCT 36.5 02/23/2014 1040   PLT 138 (L) 12/08/2021 0349   PLT 142 (L) 02/23/2014 1040   MCV 91.9 12/08/2021 0349   MCV 91 02/23/2014 1040   MCH 30.3 12/08/2021 0349   MCHC 33.0 12/08/2021 0349   RDW  12.6 12/08/2021 0349   RDW 12.8 02/23/2014 1040   LYMPHSABS 1.3 12/08/2021 0349   LYMPHSABS 1.9 02/23/2014 1040   MONOABS 1.3 (H) 12/08/2021 0349   MONOABS 0.8 02/23/2014 1040   EOSABS 0.1 12/08/2021 0349   EOSABS 0.2 02/23/2014 1040   BASOSABS 0.1 12/08/2021 0349   BASOSABS 0.1 02/23/2014 1040      Latest Ref Rng & Units 12/08/2021    3:49 AM 12/07/2021    6:38 PM 12/01/2021   12:02 AM  BMP  Glucose 70 - 99 mg/dL 725  366  440   BUN 8 - 23 mg/dL 16  18  25    Creatinine 0.44 - 1.00 mg/dL  3.47  4.25   Sodium 135 - 145 mmol/L 135  134  135   Potassium 3.5 - 5.1 mmol/L 3.4  3.4  3.5   Chloride 98 - 111 mmol/L 99  97  99   CO2 22 - 32 mmol/L 29  28  28    Calcium 8.9 - 10.3 mg/dL 7.9  8.3  8.9     DG Chest 1 View  Result Date: 12/07/2021 CLINICAL DATA:  Unwitnessed fall.  Dementia. EXAM: CHEST  1 VIEW COMPARISON:  11/25/2020 FINDINGS: Prior CABG. Atherosclerotic calcification of the aortic arch. Upper normal heart size. The lungs appear clear.  Bony demineralization is present. IMPRESSION: 1. No active cardiopulmonary disease is radiographically apparent. 2. Prior CABG. 3. Bony demineralization. Electronically Signed   By: 12/09/2021 M.D.   On: 12/07/2021 15:12   DG Hip Unilat With Pelvis 2-3 Views Left  Result Date: 12/07/2021 CLINICAL DATA:  Unwitnessed fall, hip pain, dementia. EXAM: DG HIP (WITH OR WITHOUT PELVIS) 2-3V LEFT COMPARISON:  CT pelvis 12/01/2021 FINDINGS: Bony demineralization. No left hip fracture is identified. Atherosclerosis noted. Patient has a known right basicervical femoral assessed with dedicated right hip imaging. IMPRESSION: 1. No LEFT hip fracture identified, the patient has a known right basicervical femoral neck fracture. 2. Bony demineralization. 3. Atherosclerosis. Electronically Signed   By: 12/09/2021 M.D.   On: 12/07/2021 15:10   DG Lumbar Spine Complete  Result Date: 12/07/2021 CLINICAL DATA:  Fall, right hip pain. Known L1  compression fracture. EXAM: LUMBAR SPINE - COMPLETE 4+ VIEW COMPARISON:  Lumbar CT 12/01/2021 FINDINGS: Bony demineralization. Bilateral degenerative facet arthropathy at L4-5 at L5-S1. Stable 60% compression fracture at L1 with some middle column involvement based on the mild posterior bony retropulsion which, like love before, amounts to about 0.4 cm. Stable grade 1 degenerative anterolisthesis L4-5. Stable loss of intervertebral disc height at L4-5 and L5-S1. No new fracture is readily apparent. Atherosclerosis is present, including aortoiliac atherosclerotic disease. IMPRESSION: 1. Stable 60% compression fracture at L1 with some middle column involvement. 2. Stable grade 1 degenerative anterolisthesis at L4-5. 3. Bony demineralization. 4. Aortoiliac  atherosclerotic disease. Electronically Signed   By: Gaylyn Rong M.D.   On: 12/07/2021 15:08   DG Hip Unilat  With Pelvis 2-3 Views Right  Result Date: 12/07/2021 CLINICAL DATA:  Unwitnessed fall.  Dementia.  Right hip pain. EXAM: DG HIP (WITH OR WITHOUT PELVIS) 2-3V RIGHT COMPARISON:  CT pelvis 12/01/2021 FINDINGS: New basicervical right femoral neck fracture with substantial varus angulation. Questionable extension into the greater trochanter. Bony demineralization. Atherosclerotic vascular calcifications. IMPRESSION: 1. New basicervical right femoral neck fracture with substantial varus angulation. Questionable extension into the greater trochanter. 2. Bony demineralization. Electronically Signed   By: Gaylyn Rong M.D.   On: 12/07/2021 15:06   CT Head Wo Contrast  Result Date: 12/07/2021 CLINICAL DATA:  Altered mental status.  Trauma.  Fall. EXAM: CT HEAD WITHOUT CONTRAST CT CERVICAL SPINE WITHOUT CONTRAST TECHNIQUE: Multidetector CT imaging of the head and cervical spine was performed following the standard protocol without intravenous contrast. Multiplanar CT image reconstructions of the cervical spine were also generated. RADIATION DOSE  REDUCTION: This exam was performed according to the departmental dose-optimization program which includes automated exposure control, adjustment of the mA and/or kV according to patient size and/or use of iterative reconstruction technique. COMPARISON:  CT scan of the brain November 25, 2020. FINDINGS: CT HEAD FINDINGS Brain: No subdural, epidural, or subarachnoid hemorrhage. Multiple small cerebellar infarcts, unchanged. Cerebellum, brainstem, and basal cisterns otherwise normal. Remote bilateral occipital infarcts. White matter changes. Prominence of ventricles and sulci, stable. No acute cortical ischemia or infarct identified on today's study. No mass effect or midline shift. Vascular: No hyperdense vessel or unexpected calcification. Skull: Normal. Negative for fracture or focal lesion. Sinuses/Orbits: No acute finding. Other: None. CT CERVICAL SPINE FINDINGS Alignment: Trace retrolisthesis of C3 versus C2 and C4. Trace anterolisthesis of C7 versus T1. Trace anterolisthesis of T1 versus T2. No other malalignment. Skull base and vertebrae: No acute fracture. No primary bone lesion or focal pathologic process. Soft tissues and spinal canal: No prevertebral fluid or swelling. No visible canal hematoma. Disc levels: Multilevel degenerative disc disease and facet degenerative changes. Upper chest: Negative. Other: No other abnormalities. IMPRESSION: 1. No acute intracranial abnormalities. 2. No fracture or traumatic malalignment in the cervical spine. 3. Multilevel degenerative changes. Electronically Signed   By: Gerome Sam III M.D.   On: 12/07/2021 14:41   CT Cervical Spine Wo Contrast  Result Date: 12/07/2021 CLINICAL DATA:  Altered mental status.  Trauma.  Fall. EXAM: CT HEAD WITHOUT CONTRAST CT CERVICAL SPINE WITHOUT CONTRAST TECHNIQUE: Multidetector CT imaging of the head and cervical spine was performed following the standard protocol without intravenous contrast. Multiplanar CT image reconstructions  of the cervical spine were also generated. RADIATION DOSE REDUCTION: This exam was performed according to the departmental dose-optimization program which includes automated exposure control, adjustment of the mA and/or kV according to patient size and/or use of iterative reconstruction technique. COMPARISON:  CT scan of the brain November 25, 2020. FINDINGS: CT HEAD FINDINGS Brain: No subdural, epidural, or subarachnoid hemorrhage. Multiple small cerebellar infarcts, unchanged. Cerebellum, brainstem, and basal cisterns otherwise normal. Remote bilateral occipital infarcts. White matter changes. Prominence of ventricles and sulci, stable. No acute cortical ischemia or infarct identified on today's study. No mass effect or midline shift. Vascular: No hyperdense vessel or unexpected calcification. Skull: Normal. Negative for fracture or focal lesion. Sinuses/Orbits: No acute finding. Other: None. CT CERVICAL SPINE FINDINGS Alignment: Trace retrolisthesis of C3 versus C2 and C4. Trace anterolisthesis of C7 versus T1. Trace anterolisthesis  of T1 versus T2. No other malalignment. Skull base and vertebrae: No acute fracture. No primary bone lesion or focal pathologic process. Soft tissues and spinal canal: No prevertebral fluid or swelling. No visible canal hematoma. Disc levels: Multilevel degenerative disc disease and facet degenerative changes. Upper chest: Negative. Other: No other abnormalities. IMPRESSION: 1. No acute intracranial abnormalities. 2. No fracture or traumatic malalignment in the cervical spine. 3. Multilevel degenerative changes. Electronically Signed   By: Dorise Bullion III M.D.   On: 12/07/2021 14:41    Disposition Plan & Communication  Patient status: Inpatient  Admitted From: Home Planned disposition location: likely SNF Anticipated discharge date: TBD pending ortho surgery   Family Communication: daughter in law at bedside    Author: Richarda Osmond, DO [facility] 12/08/2021, 7:50 AM   Available by Epic secure chat 7AM-7PM. If 7PM-7AM, please contact night-coverage.  [facility] contact information found on [email or web address].

## 2021-12-08 NOTE — Progress Notes (Signed)
Initial Nutrition Assessment  DOCUMENTATION CODES:   Underweight  INTERVENTION:   -Once diet is advanced, add:   -Ensure Enlive po TID, each supplement provides 350 kcal and 20 grams of protein -MVI with minerals daily  NUTRITION DIAGNOSIS:   Increased nutrient needs related to post-op healing as evidenced by estimated needs.  GOAL:   Patient will meet greater than or equal to 90% of their needs  MONITOR:   PO intake, Supplement acceptance, Diet advancement  REASON FOR ASSESSMENT:   Consult Assessment of nutrition requirement/status, Hip fracture protocol  ASSESSMENT:   Pt with medical history significant of CAD s/p CABG, CVA, TAVR, hypertension, hyperlipidemia who presents after ground-level fall.  Pt admitted with rt femur fracture.   Reviewed I/O's: -250 ml x 24 hours   UOP: 250 ml x 24 hours  Pt currently NPO for IMN today per orthopedics.   Pt unavailable at time of visit. Attempted to speak with pt x 2 however, was in with MD or down in surgery at times of attempted visits. RD unable to obtain further nutrition-related history or complete nutrition-focused physical exam at this time.    Reviewed wt hx; wt has been stable over the past year.   Given advanced age and underweight status, highly suspect malnutrition, however, unable to identify at this time. Pt would greatly benefit from addition of oral nutrition supplements secondary to increased nutritional needs for post-op healing.   Medications reviewed.   Lab Results  Component Value Date   HGBA1C 6.3 (H) 11/27/2020   PTA DM medications are none.   Labs reviewed: K: 3.4 (inpatient orders for glycemic control are none).    Diet Order:   Diet Order             Diet regular Room service appropriate? Yes; Fluid consistency: Thin  Diet effective ____                   EDUCATION NEEDS:   No education needs have been identified at this time  Skin:  Skin Assessment: Reviewed RN  Assessment  Last BM:  Unknown  Height:   Ht Readings from Last 1 Encounters:  12/08/21 5\' 3"  (1.6 m)    Weight:   Wt Readings from Last 1 Encounters:  12/08/21 41.6 kg    Ideal Body Weight:  52.3 kg  BMI:  Body mass index is 16.25 kg/m.  Estimated Nutritional Needs:   Kcal:  1250-1450  Protein:  60-75 grams  Fluid:  > 1.2 L    Loistine Chance, RD, LDN, [place] Registered Dietitian II Certified Diabetes Care and Education Specialist Please refer to [facility] for RD and/or RD on-call/weekend/after hours pager

## 2021-12-08 NOTE — Anesthesia Procedure Notes (Addendum)
Procedure Name: Intubation Date/Time: 12/08/2021 2:07 PM  Performed by: Esaw Grandchild, CRNAPre-anesthesia Checklist: Patient identified, Emergency Drugs available, Suction available and Patient being monitored Patient Re-evaluated:Patient Re-evaluated prior to induction Oxygen Delivery Method: Circle system utilized Preoxygenation: Pre-oxygenation with 100% oxygen Induction Type: IV induction Ventilation: Mask ventilation without difficulty Laryngoscope Size: Miller and 2 Grade View: Grade I Tube type: Oral Tube size: 7.0 mm Number of attempts: 1 Airway Equipment and Method: Stylet and Oral airway Placement Confirmation: ETT inserted through vocal cords under direct vision, positive ETCO2 and breath sounds checked- equal and bilateral Secured at: 21 cm Tube secured with: Tape Dental Injury: Teeth and Oropharynx as per pre-operative assessment

## 2021-12-08 NOTE — OR Nursing (Signed)
Top dentures removed intact by Dr. Amie Critchley, placed in pt labeled denture cup and bag. Transferred to PACU with pt.  Ladell Heads, RN

## 2021-12-08 NOTE — Plan of Care (Signed)

## 2021-12-08 NOTE — Transfer of Care (Signed)
Immediate Anesthesia Transfer of Care Note  Patient: Madison Brown  Procedure(s) Performed: INTRAMEDULLARY (IM) NAIL INTERTROCHANTERIC (Right: Hip)  Patient Location: PACU  Anesthesia Type:General  Level of Consciousness: drowsy  Airway & Oxygen Therapy: Patient Spontanous Breathing and Patient connected to nasal cannula oxygen  Post-op Assessment: Report given to RN and Post -op Vital signs reviewed and stable  Post vital signs: Reviewed and stable  Last Vitals:  Vitals Value Taken Time  BP 137 52 1525  Temp    Pulse 78 1525  Resp 14 1525  SpO2 95 1525    Last Pain:  Vitals:   12/08/21 1110  TempSrc:   PainSc: Asleep         Complications: No notable events documented.

## 2021-12-09 ENCOUNTER — Inpatient Hospital Stay: Admission: RE | Admit: 2021-12-09 | Payer: Medicare Other | Source: Ambulatory Visit

## 2021-12-09 ENCOUNTER — Encounter: Payer: Self-pay | Admitting: Orthopedic Surgery

## 2021-12-09 DIAGNOSIS — S72001A Fracture of unspecified part of neck of right femur, initial encounter for closed fracture: Secondary | ICD-10-CM

## 2021-12-09 DIAGNOSIS — W19XXXA Unspecified fall, initial encounter: Secondary | ICD-10-CM

## 2021-12-09 DIAGNOSIS — I10 Essential (primary) hypertension: Secondary | ICD-10-CM

## 2021-12-09 DIAGNOSIS — I2510 Atherosclerotic heart disease of native coronary artery without angina pectoris: Secondary | ICD-10-CM | POA: Diagnosis not present

## 2021-12-09 DIAGNOSIS — R410 Disorientation, unspecified: Secondary | ICD-10-CM

## 2021-12-09 DIAGNOSIS — Z8781 Personal history of (healed) traumatic fracture: Secondary | ICD-10-CM

## 2021-12-09 LAB — CBC
HCT: 26.6 % — ABNORMAL LOW (ref 36.0–46.0)
Hemoglobin: 8.6 g/dL — ABNORMAL LOW (ref 12.0–15.0)
MCH: 29.9 pg (ref 26.0–34.0)
MCHC: 32.3 g/dL (ref 30.0–36.0)
MCV: 92.4 fL (ref 80.0–100.0)
Platelets: 140 10*3/uL — ABNORMAL LOW (ref 150–400)
RBC: 2.88 MIL/uL — ABNORMAL LOW (ref 3.87–5.11)
RDW: 12.5 % (ref 11.5–15.5)
WBC: 16.2 10*3/uL — ABNORMAL HIGH (ref 4.0–10.5)
nRBC: 0 % (ref 0.0–0.2)

## 2021-12-09 LAB — BASIC METABOLIC PANEL
Anion gap: 8 (ref 5–15)
BUN: 13 mg/dL (ref 8–23)
CO2: 30 mmol/L (ref 22–32)
Calcium: 8.1 mg/dL — ABNORMAL LOW (ref 8.9–10.3)
Chloride: 102 mmol/L (ref 98–111)
Creatinine, Ser: 0.99 mg/dL (ref 0.44–1.00)
GFR, Estimated: 54 mL/min — ABNORMAL LOW (ref >60)
Glucose, Bld: 131 mg/dL — ABNORMAL HIGH (ref 70–99)
Potassium: 3.4 mmol/L — ABNORMAL LOW (ref 3.5–5.1)
Sodium: 140 mmol/L (ref 135–145)

## 2021-12-09 MED ORDER — TRAMADOL HCL 50 MG PO TABS: 50.0000 mg | ORAL_TABLET | Freq: Four times a day (QID) | ORAL | 0 refills | Status: DC | PRN

## 2021-12-09 MED ORDER — OXYCODONE HCL 5 MG PO TABS: 2.5000 mg | ORAL_TABLET | ORAL | 0 refills | Status: DC | PRN

## 2021-12-09 NOTE — Discharge Instructions (Signed)
INSTRUCTIONS AFTER Surgery  Remove items at home which could result in a fall. This includes throw rugs or furniture in walking pathways ICE to the affected joint every three hours while awake for 30 minutes at a time, for at least the first 3-5 days, and then as needed for pain and swelling.  Continue to use ice for pain and swelling. You may notice swelling that will progress down to the foot and ankle.  This is normal after surgery.  Elevate your leg when you are not up walking on it.   Continue to use the breathing machine you got in the hospital (incentive spirometer) which will help keep your temperature down.  It is common for your temperature to cycle up and down following surgery, especially at night when you are not up moving around and exerting yourself.  The breathing machine keeps your lungs expanded and your temperature down.   DIET:  As you were doing prior to hospitalization, we recommend a well-balanced diet.  DRESSING / WOUND CARE / SHOWERING  Dressing change as needed.  No showering until after staples are removed.  Staples will be removed in 2 weeks at [facility] clinic orthopedics.  ACTIVITY  Increase activity slowly as tolerated, but follow the weight bearing instructions below.   No driving for 6 weeks or until further direction given by your physician.  You cannot drive while taking narcotics.  No lifting or carrying greater than 10 lbs. until further directed by your surgeon. Avoid periods of inactivity such as sitting longer than an hour when not asleep. This helps prevent blood clots.  You may return to work once you are authorized by your doctor.     WEIGHT BEARING  Weightbearing as tolerated on the right   EXERCISES Gait training and ambulation training.  CONSTIPATION  Constipation is defined medically as fewer than three stools per week and severe constipation as less than one stool per week.  Even if you have a regular bowel pattern at home, your normal  regimen is likely to be disrupted due to multiple reasons following surgery.  Combination of anesthesia, postoperative narcotics, change in appetite and fluid intake all can affect your bowels.   YOU MUST use at least one of the following options; they are listed in order of increasing strength to get the job done.  They are all available over the counter, and you may need to use some, POSSIBLY even all of these options:    Drink plenty of fluids (prune juice may be helpful) and high fiber foods Colace 100 mg by mouth twice a day  Senokot for constipation as directed and as needed Dulcolax (bisacodyl), take with full glass of water  Miralax (polyethylene glycol) once or twice a day as needed.  If you have tried all these things and are unable to have a bowel movement in the first 3-4 days after surgery call either your surgeon or your primary doctor.    If you experience loose stools or diarrhea, hold the medications until you stool forms back up.  If your symptoms do not get better within 1 week or if they get worse, check with your doctor.  If you experience "the worst abdominal pain ever" or develop nausea or vomiting, please contact the office immediately for further recommendations for treatment.   ITCHING:  If you experience itching with your medications, try taking only a single pain pill, or even half a pain pill at a time.  You can also use Benadryl  over the counter for itching or also to help with sleep.   TED HOSE STOCKINGS:  Use stockings on both legs until for at least 2 weeks or as directed by physician office. They may be removed at night for sleeping.  MEDICATIONS:  See your medication summary on the "After Visit Summary" that nursing will review with you.  You may have some home medications which will be placed on hold until you complete the course of blood thinner medication.  It is important for you to complete the blood thinner medication as prescribed.  PRECAUTIONS:  If you  experience chest pain or shortness of breath - call 911 immediately for transfer to the hospital emergency department.   If you develop a fever greater that 101 F, purulent drainage from wound, increased redness or drainage from wound, foul odor from the wound/dressing, or calf pain - CONTACT YOUR SURGEON.                                                   FOLLOW-UP APPOINTMENTS:  If you do not already have a post-op appointment, please call the office for an appointment to be seen by your surgeon.  Guidelines for how soon to be seen are listed in your "After Visit Summary", but are typically between 1-4 weeks after surgery.  OTHER INSTRUCTIONS:     MAKE SURE YOU:  Understand these instructions.  Get help right away if you are not doing well or get worse.    Thank you for letting us be a part of your medical care team.  It is a privilege we respect greatly.  We hope these instructions will help you stay on track for a fast and full recovery!

## 2021-12-09 NOTE — NC FL2 (Addendum)
[place] LEVEL OF CARE SCREENING TOOL     IDENTIFICATION  Patient Name: Madison Brown Birthdate: 07/25/1929 Sex: female Admission Date (Current Location): 12/07/2021  [place] and [place] Number:  Engineering geologist and Address:  [facility], [address] [place], [place] [address]      Provider Number: [id number]  Attending Physician Name and Address:  Richarda Osmond, MD  Relative Name and Phone Number:  Silva Bandy daughter [phone]    Current Level of Care: Hospital Recommended Level of Care: [place] Prior Approval Number:    Date Approved/Denied:   PASRR Number:  [id number]  Discharge Plan: SNF    Current Diagnoses: Patient Active Problem List   Diagnosis Date Noted   Closed right hip fracture, initial encounter [facility])    Fall    History of vertebral fracture    Femur fracture, right ([place]) 12/07/2021   Delirium 12/07/2021   Leukocytosis 12/07/2021   Urinary tract infection without hematuria    Protein-calorie malnutrition, severe ([place]) 12/04/2020   Hx of CABG 11/26/2020   S/P aortic valve replacement with bioprosthetic valve 11/26/2020   Dementia ([place]) 11/26/2020   TIA (transient ischemic attack) 11/25/2020   Acute CVA (cerebrovascular accident) ([place]) 11/25/2020   COVID-19 virus infection 11/25/2020   Slurred speech 11/25/2020   Underweight 02/23/2017   Essential hypertension, benign 03/30/2013   Other and unspecified hyperlipidemia 03/30/2013   Encounter for preventive health examination 10/06/2012   CAD (coronary artery disease) 06/22/2011    Orientation RESPIRATION BLADDER Height & Weight     Self, Place, Situation  Normal Continent Weight: 41.6 kg Height:  5\' 3"  (160 cm)  BEHAVIORAL SYMPTOMS/MOOD NEUROLOGICAL BOWEL NUTRITION STATUS      Continent Diet (see dc summary)  AMBULATORY STATUS COMMUNICATION OF NEEDS Skin   Extensive Assist Verbally Normal, Surgical wounds                        Personal Care Assistance Level of Assistance  Bathing, Feeding, Dressing Bathing Assistance: Maximum assistance Feeding assistance: Limited assistance Dressing Assistance: Maximum assistance     Functional Limitations Info             SPECIAL CARE FACTORS FREQUENCY  PT (By licensed PT), OT (By licensed OT)     PT Frequency: 5 times per week OT Frequency: 5 times per week            Contractures Contractures Info: Not present    Additional Factors Info  Code Status, Allergies Code Status Info: partial Allergies Info: NKDA           Current Medications (12/09/2021):  This is the current hospital active medication list Current Facility-Administered Medications  Medication Dose Route Frequency Provider Last Rate Last Admin   0.9 %  sodium chloride infusion   Intravenous Continuous Leim Fabry, MD 75 mL/hr at 12/09/21 1540 Infusion Verify at 12/09/21 1540   acetaminophen (TYLENOL) tablet 1,000 mg  1,000 mg Oral Q8H Leim Fabry, MD   1,000 mg at 12/09/21 1440   acetaminophen (TYLENOL) tablet 325-650 mg  325-650 mg Oral Q6H PRN Leim Fabry, MD       aspirin EC tablet 325 mg  325 mg Oral Daily Leim Fabry, MD   325 mg at 12/09/21 0957   bisacodyl (DULCOLAX) suppository 10 mg  10 mg Rectal Daily PRN Leim Fabry, MD       clopidogrel (PLAVIX) tablet 75 mg  75 mg Oral  Daily Signa Kell, MD   75 mg at 12/09/21 0957   feeding supplement (ENSURE ENLIVE / ENSURE PLUS) liquid 237 mL  237 mL Oral BID BM Signa Kell, MD       feeding supplement (ENSURE ENLIVE / ENSURE PLUS) liquid 237 mL  237 mL Oral TID BM Leeroy Bock, MD   237 mL at 12/09/21 1441   HYDROmorphone (DILAUDID) injection 0.2-0.4 mg  0.2-0.4 mg Intravenous Q4H PRN Signa Kell, MD       methocarbamol (ROBAXIN) tablet 500 mg  500 mg Oral Q6H PRN Signa Kell, MD       Or   methocarbamol (ROBAXIN) 500 mg in dextrose 5 % 50 mL IVPB  500 mg Intravenous Q6H PRN Signa Kell, MD       metoCLOPramide  (REGLAN) tablet 5-10 mg  5-10 mg Oral Q8H PRN Signa Kell, MD       Or   metoCLOPramide (REGLAN) injection 5-10 mg  5-10 mg Intravenous Q8H PRN Signa Kell, MD       multivitamin with minerals tablet 1 tablet  1 tablet Oral Daily Leeroy Bock, MD   1 tablet at 12/09/21 0957   ondansetron (ZOFRAN) tablet 4 mg  4 mg Oral Q6H PRN Signa Kell, MD       Or   ondansetron [employer]) injection 4 mg  4 mg Intravenous Q6H PRN Signa Kell, MD       oxyCODONE (Oxy IR/ROXICODONE) immediate release tablet 2.5-5 mg  2.5-5 mg Oral Q4H PRN Signa Kell, MD   5 mg at 12/09/21 0446   sodium phosphate (FLEET) 7-19 GM/118ML enema 1 enema  1 enema Rectal Once PRN Signa Kell, MD       traMADol Janean Sark) tablet 50 mg  50 mg Oral Q6H PRN Signa Kell, MD         Discharge Medications: Please see discharge summary for a list of discharge medications.  Relevant Imaging Results:  Relevant Lab Results:   Additional Information SSN [id number]  Deniqua Perry [place] Carol, RN

## 2021-12-09 NOTE — Evaluation (Signed)
Physical Therapy Evaluation Patient Details Name: Madison Brown MRN: [id number] DOB: [birth year] Today's Date: 12/09/2021  History of Present Illness  Pt is a 88 y.o. female with medical history significant for CAD s/p CABG, CVA, TAVR, hypertension, hyperlipidemia who presents to the ED after ground-level fall.  Of note pt with recent L1 compression fracture with patient scheduled for kyphoplasty prior to fall. MD assessment includes: Right intertrochanteric hip fracture now s/p intramedullary nailing, delirium, and leukocytosis.   Clinical Impression  Pt was pleasant and motivated to participate during the session and put forth good effort throughout.  Pt somewhat confused but able to provide much of the below history with family assisting as needed.  Pt required physical assistance with all functional tasks along with cuing for proper sequencing.  Pt able to amb 2 x 5 feet near EOB with very effortful, shuffling steps and mod A for stability.  Pt found on 2LO2/min with nursing giving ok for trial on room air.  During bed mobility training pt's SpO2 on room air dropped to the mid 80s with pt returned to 2LO2/min with SpO2 quickly returning to the low 90s with PLB cuing, nursing notified. Pt is at a very high risk for falls and would not be safe to return to her prior living situation at this time.  Pt will benefit from PT services in a SNF setting upon discharge to safely address deficits listed in patient problem list for decreased caregiver assistance and eventual return to PLOF.            Recommendations for follow up therapy are one component of a multi-disciplinary discharge planning process, led by the attending physician.  Recommendations may be updated based on patient status, additional functional criteria and insurance authorization.  Follow Up Recommendations Skilled nursing-short term rehab (<3 hours/day) Can patient physically be transported by private vehicle: No    Assistance  Recommended at Discharge Frequent or constant Supervision/Assistance  Patient can return home with the following  A lot of help with walking and/or transfers;A lot of help with bathing/dressing/bathroom;Assistance with cooking/housework;Direct supervision/assist for medications management;Assist for transportation;Help with stairs or ramp for entrance    Equipment Recommendations None recommended by PT  Recommendations for Other Services       Functional Status Assessment Patient has had a recent decline in their functional status and demonstrates the ability to make significant improvements in function in a reasonable and predictable amount of time.     Precautions / Restrictions Precautions Precautions: Fall Restrictions Weight Bearing Restrictions: Yes RLE Weight Bearing: Weight bearing as tolerated      Mobility  Bed Mobility Overal bed mobility: Needs Assistance Bed Mobility: Rolling, Sidelying to Sit Rolling: Mod assist Sidelying to sit: Mod assist       General bed mobility comments: Log roll training secondary to L1 compression fracture with Mod A for BLE and trunk control    Transfers Overall transfer level: Needs assistance Equipment used: Rolling walker (2 wheels) Transfers: Sit to/from Stand Sit to Stand: Mod assist, From elevated surface           General transfer comment: Mod assist to come to standing and for hand placement on the RW    Ambulation/Gait Ambulation/Gait assistance: Mod assist, +2 safety/equipment Gait Distance (Feet): 5 Feet Assistive device: Rolling walker (2 wheels) Gait Pattern/deviations: Step-to pattern, Decreased stance time - right, Trunk flexed, Decreased step length - left, Shuffle, Narrow base of support Gait velocity: decreased     General Gait Details:  Pt able to amb with assist with a RW with heavily flexed trunk posture that improved only briefly with cues; very short, narrow, shuffling steps with poor  stability  Stairs            Wheelchair Mobility    Modified Rankin (Stroke Patients Only)       Balance Overall balance assessment: Needs assistance, History of Falls   Sitting balance-Leahy Scale: Fair     Standing balance support: Bilateral upper extremity supported, During functional activity, Reliant on assistive device for balance Standing balance-Leahy Scale: Poor                               Pertinent Vitals/Pain Pain Assessment Pain Assessment: No/denies pain    Home Living Family/patient expects to be discharged to:: Private residence Living Arrangements: Spouse/significant other Available Help at Discharge: Family;Available PRN/intermittently Type of Home: House Home Access: Stairs to enter Entrance Stairs-Rails: Left Entrance Stairs-Number of Steps: 4   Home Layout: One level Home Equipment: BSC/3in1;Rolling Walker (2 wheels);Cane - single point      Prior Function Prior Level of Function : Needs assist  Cognitive Assist : Mobility (cognitive)     Physical Assist : Mobility (physical)     Mobility Comments: SBA/CGA with amb community distances with a RW, "many" falls in the last 6 moths, family unsure of how many ADLs Comments: Assist from family with bathing, Ind with dressing     Hand Dominance   Dominant Hand: Right    Extremity/Trunk Assessment   Upper Extremity Assessment Upper Extremity Assessment: Generalized weakness    Lower Extremity Assessment Lower Extremity Assessment: Generalized weakness;RLE deficits/detail RLE: Unable to fully assess due to pain       Communication   Communication: No difficulties  Cognition Arousal/Alertness: Awake/alert Behavior During Therapy: WFL for tasks assessed/performed Overall Cognitive Status: History of cognitive impairments - at baseline                                          General Comments      Exercises Other Exercises Other Exercises: Log  roll training with pt and family   Assessment/Plan    PT Assessment Patient needs continued PT services  PT Problem List Decreased strength;Decreased activity tolerance;Decreased balance;Decreased mobility;Decreased knowledge of use of DME;Pain       PT Treatment Interventions DME instruction;Gait training;Stair training;Functional mobility training;Therapeutic activities;Therapeutic exercise;Balance training;Patient/family education    PT Goals (Current goals can be found in the Care Plan section)  Acute Rehab PT Goals Patient Stated Goal: To get stronger PT Goal Formulation: With patient Time For Goal Achievement: 12/22/21 Potential to Achieve Goals: Good    Frequency BID     Co-evaluation               AM-PAC PT "6 Clicks" Mobility  Outcome Measure Help needed turning from your back to your side while in a flat bed without using bedrails?: A Lot Help needed moving from lying on your back to sitting on the side of a flat bed without using bedrails?: A Lot Help needed moving to and from a bed to a chair (including a wheelchair)?: A Lot Help needed standing up from a chair using your arms (e.g., wheelchair or bedside chair)?: A Lot Help needed to walk in hospital room?: A Lot Help needed  climbing 3-5 steps with a railing? : Total 6 Click Score: 11    End of Session Equipment Utilized During Treatment: Gait belt;Oxygen Activity Tolerance: Patient tolerated treatment well Patient left: in chair;with call bell/phone within reach;with chair alarm set;with family/visitor present;with SCD's reapplied Nurse Communication: Mobility status;Weight bearing status PT Visit Diagnosis: Unsteadiness on feet (R26.81);History of falling (Z91.81);Other abnormalities of gait and mobility (R26.89);Muscle weakness (generalized) (M62.81);Pain Pain - Right/Left: Right Pain - part of body: Hip    Time: 0911-0952 PT Time Calculation (min) (ACUTE ONLY): 41 min   Charges:   PT  Evaluation $PT Eval Moderate Complexity: 1 Mod PT Treatments $Therapeutic Activity: 8-22 mins       D. Scott Celso Granja PT, DPT 12/09/21, 11:28 AM

## 2021-12-09 NOTE — Progress Notes (Signed)
  Chaplain On-Call responded to a call from [employer] who reported that the patient and family request to speak with a Chaplain.  Chaplain met the patient; daughter Mellody Life, and daughter-in-law Charlsey Moragne.  Chaplain learned from the family that they wanted information about [place].  Chaplain provided the AD documents to them, and offered education which emphasized that the patient needs to be able to understand the document that she would be signing.  The family stated that the patient is still feeling the effects of anesthesia from her hip repair surgery, and that another day will be better for them to have this discussion with the patient.  Chaplain provided emotional support.  Chaplain Pollyann Samples M.Div., [facility]

## 2021-12-09 NOTE — TOC Progression Note (Signed)
Transition of Care (TOC) - Progression Note    Patient Details  Name: Madison Brown MRN: [id number] Date of Birth: 05/05/1929  Transition of Care (TOC) CM/SW Contact   J Louvet, RN Phone Number: 12/09/2021, 3:36 PM  Clinical Narrative:    Met with the patient and the family in the room, They are agreeable to a bedsearch and would like to go to [facility], bedsearch sent, will review the bed offers once available        Expected Discharge Plan and Services                                                 Social Determinants of Health (SDOH) Interventions    Readmission Risk Interventions     No data to display          

## 2021-12-09 NOTE — Progress Notes (Signed)
Physical Therapy Treatment Patient Details Name: Madison Brown MRN: [id number] DOB: [birth year] Today's Date: 12/09/2021   History of Present Illness Pt is a 88 y.o. female with medical history significant for CAD s/p CABG, CVA, TAVR, hypertension, hyperlipidemia who presents to the ED after ground-level fall.  Of note pt with recent L1 compression fracture with patient scheduled for kyphoplasty prior to fall. MD assessment includes: Right intertrochanteric hip fracture now s/p intramedullary nailing, delirium, and leukocytosis.    PT Comments    Pt was pleasant and motivated to participate during the session and put forth good effort throughout. Pt reported no pain during the session but did ambulate with min antalgic gait pattern.  Pt required only minimal assist with transfers and gait this session and was able to increase max amb distance to 25 feet with a chair follow.  Pt given frequent cuing for upright posture during gait with no carryover and required min A for stability as well as to guide the RW with daughter stating that pt has chronic visual deficits secondary to past CVA.  Pt remains at a very high risk for falls and has limited physical assistance available at home. Pt will benefit from PT services in a SNF setting upon discharge to safely address deficits listed in patient problem list for decreased caregiver assistance and eventual return to PLOF.     Recommendations for follow up therapy are one component of a multi-disciplinary discharge planning process, led by the attending physician.  Recommendations may be updated based on patient status, additional functional criteria and insurance authorization.  Follow Up Recommendations  Skilled nursing-short term rehab (<3 hours/day) Can patient physically be transported by private vehicle: Yes   Assistance Recommended at Discharge Frequent or constant Supervision/Assistance  Patient can return home with the following A lot of help  with walking and/or transfers;A lot of help with bathing/dressing/bathroom;Assistance with cooking/housework;Direct supervision/assist for medications management;Assist for transportation;Help with stairs or ramp for entrance   Equipment Recommendations  None recommended by PT    Recommendations for Other Services       Precautions / Restrictions Precautions Precautions: Fall Restrictions Weight Bearing Restrictions: Yes RLE Weight Bearing: Weight bearing as tolerated     Mobility  Bed Mobility Overal bed mobility: Needs Assistance Bed Mobility: Rolling, Sidelying to Sit Rolling: Mod assist Sidelying to sit: Mod assist       General bed mobility comments: received and left in recliner    Transfers Overall transfer level: Needs assistance Equipment used: Rolling walker (2 wheels) Transfers: Sit to/from Stand Sit to Stand: Min assist           General transfer comment: Mod verbal and tactile cues for hand placement    Ambulation/Gait Ambulation/Gait assistance: Min assist Gait Distance (Feet): 25 Feet Assistive device: Rolling walker (2 wheels) Gait Pattern/deviations: Step-to pattern, Decreased stance time - right, Trunk flexed, Decreased step length - left, Shuffle, Narrow base of support, Step-through pattern Gait velocity: decreased     General Gait Details: Alternating step-to and step-through pattern progressing mostly to step-through as walking progressed; heavy trunk flexion with poor carryover of cuing for upright posture, narrow BOS, and slow cadence with min A for stability and to guide the RW   Stairs             Wheelchair Mobility    Modified Rankin (Stroke Patients Only)       Balance Overall balance assessment: Needs assistance, History of Falls Sitting-balance support: No upper extremity supported, Feet  supported Sitting balance-Leahy Scale: Fair     Standing balance support: Bilateral upper extremity supported, During functional  activity, Reliant on assistive device for balance Standing balance-Leahy Scale: Poor                              Cognition Arousal/Alertness: Awake/alert Behavior During Therapy: WFL for tasks assessed/performed Overall Cognitive Status: History of cognitive impairments - at baseline                                          Exercises Total Joint Exercises Ankle Circles/Pumps: AROM, Strengthening, Both, 5 reps, 10 reps Quad Sets: Strengthening, Both, 10 reps Gluteal Sets: Strengthening, Both, 10 reps Long Arc Quad: AROM, Strengthening, Both, 10 reps Knee Flexion: AROM, Strengthening, Both, 10 reps Other Exercises Other Exercises: HEP education with pt and family for BLE APs, QS, GS, and LAQ x 10 each every 1-2 hours daily    General Comments        Pertinent Vitals/Pain Pain Assessment Pain Assessment: No/denies pain    Home Living Family/patient expects to be discharged to:: Private residence Living Arrangements: Spouse/significant other Available Help at Discharge: Family;Available PRN/intermittently Type of Home: House Home Access: Stairs to enter Entrance Stairs-Rails: Left Entrance Stairs-Number of Steps: 4   Home Layout: One level Home Equipment: BSC/3in1;Rolling Walker (2 wheels);Cane - single point      Prior Function            PT Goals (current goals can now be found in the care plan section) Progress towards PT goals: Progressing toward goals    Frequency    BID      PT Plan Current plan remains appropriate    Co-evaluation              AM-PAC PT "6 Clicks" Mobility   Outcome Measure  Help needed turning from your back to your side while in a flat bed without using bedrails?: A Lot Help needed moving from lying on your back to sitting on the side of a flat bed without using bedrails?: A Lot Help needed moving to and from a bed to a chair (including a wheelchair)?: A Lot Help needed standing up from a  chair using your arms (e.g., wheelchair or bedside chair)?: A Little Help needed to walk in hospital room?: A Little Help needed climbing 3-5 steps with a railing? : A Lot 6 Click Score: 14    End of Session Equipment Utilized During Treatment: Gait belt Activity Tolerance: Patient tolerated treatment well Patient left: in chair;with call bell/phone within reach;with chair alarm set;with family/visitor present;with SCD's reapplied Nurse Communication: Mobility status;Weight bearing status PT Visit Diagnosis: Unsteadiness on feet (R26.81);History of falling (Z91.81);Other abnormalities of gait and mobility (R26.89);Muscle weakness (generalized) (M62.81);Pain Pain - Right/Left: Right Pain - part of body: Hip     Time: 0911-0952 PT Time Calculation (min) (ACUTE ONLY): 41 min  Charges:  $Gait Training: 8-22 mins $Therapeutic Exercise: 8-22 mins                     D. Scott Tobias Avitabile PT, DPT 12/09/21, 5:21 PM

## 2021-12-09 NOTE — Anesthesia Postprocedure Evaluation (Signed)
Anesthesia Post Note  Patient: Madison Brown  Procedure(s) Performed: INTRAMEDULLARY (IM) NAIL INTERTROCHANTERIC (Right: Hip)  Patient location during evaluation: PACU Anesthesia Type: General Level of consciousness: awake and alert Pain management: pain level controlled Vital Signs Assessment: post-procedure vital signs reviewed and stable Respiratory status: spontaneous breathing, nonlabored ventilation, respiratory function stable and patient connected to nasal cannula oxygen Cardiovascular status: blood pressure returned to baseline and stable Postop Assessment: no apparent nausea or vomiting Anesthetic complications: no   No notable events documented.   Last Vitals:  Vitals:   12/08/21 2006 12/08/21 2331  BP: 139/73 (!) 137/58  Pulse: 89 79  Resp: 19 18  Temp:  37.5 C  SpO2:  100%    Last Pain:  Vitals:   12/09/21 0531  TempSrc:   PainSc: [place]

## 2021-12-09 NOTE — Evaluation (Signed)
Occupational Therapy Evaluation Patient Details Name: Madison Brown MRN: [id number] DOB: 14-Oct-1929 Today's Date: 12/09/2021   History of Present Illness Pt is a 88 y.o. female with medical history significant for CAD s/p CABG, CVA, TAVR, hypertension, hyperlipidemia who presents to the ED after ground-level fall.  Of note pt with recent L1 compression fracture with patient scheduled for kyphoplasty prior to fall. MD assessment includes: Right intertrochanteric hip fracture now s/p intramedullary nailing, delirium, and leukocytosis.   Clinical Impression   Ms Taft was seen for OT evaluation this date. Prior to hospital admission, pt was MOD I for mobility and PRN assist for ADLs; hx of falls. Pt lives with spouse. Pt presents to acute OT demonstrating impaired ADL performance and functional mobility 2/2 decreased activity tolerance and functional strength/ROM/balance deficits. Pt currently requires MIN A + RW sit<>stand from chair and ~5 ft forwards decreasing to MOD A for LOB and for 5 steps backwards - step by step cues 2/2 hx visual deficits. SpO2 93% on RA at rest, 87% with mobility, resolved in sitting. Pt would benefit from skilled OT to address noted impairments and functional limitations (see below for any additional details). Upon hospital discharge, recommend STR to maximize pt safety and return to PLOF.   Recommendations for follow up therapy are one component of a multi-disciplinary discharge planning process, led by the attending physician.  Recommendations may be updated based on patient status, additional functional criteria and insurance authorization.   Follow Up Recommendations  Skilled nursing-short term rehab (<3 hours/day)    Assistance Recommended at Discharge Frequent or constant Supervision/Assistance  Patient can return home with the following A lot of help with walking and/or transfers;A lot of help with bathing/dressing/bathroom    Functional Status Assessment   Patient has had a recent decline in their functional status and demonstrates the ability to make significant improvements in function in a reasonable and predictable amount of time.  Equipment Recommendations  Other (comment) (defer)    Recommendations for Other Services       Precautions / Restrictions Precautions Precautions: Fall Restrictions Weight Bearing Restrictions: Yes RLE Weight Bearing: Weight bearing as tolerated      Mobility Bed Mobility               General bed mobility comments: received and left sitting    Transfers Overall transfer level: Needs assistance Equipment used: Rolling walker (2 wheels) Transfers: Sit to/from Stand Sit to Stand: Min assist           General transfer comment: narrow BOS, cues for upright posture      Balance Overall balance assessment: Needs assistance, History of Falls Sitting-balance support: No upper extremity supported, Feet supported Sitting balance-Leahy Scale: Fair     Standing balance support: Bilateral upper extremity supported, During functional activity, Reliant on assistive device for balance Standing balance-Leahy Scale: Poor                             ADL either performed or assessed with clinical judgement   ADL Overall ADL's : Needs assistance/impaired                                       General ADL Comments: MIN A + RW for simulated BSC t/f decreasing to MOD A for LOB - step by step cues 2/2 hx visual deficits. MAX  A for LB access reclined in chair     Vision Ability to See in Adequate Light: 2 Moderately impaired Patient Visual Report: Peripheral vision impairment (chronic since stroke)              Pertinent Vitals/Pain Pain Assessment Pain Assessment: No/denies pain     Hand Dominance Right   Extremity/Trunk Assessment Upper Extremity Assessment Upper Extremity Assessment: Generalized weakness   Lower Extremity Assessment Lower Extremity  Assessment: Generalized weakness RLE: Unable to fully assess due to pain       Communication Communication Communication: No difficulties   Cognition Arousal/Alertness: Awake/alert Behavior During Therapy: WFL for tasks assessed/performed Overall Cognitive Status: History of cognitive impairments - at baseline                                        Home Living Family/patient expects to be discharged to:: Private residence Living Arrangements: Spouse/significant other Available Help at Discharge: Family;Available PRN/intermittently Type of Home: House Home Access: Stairs to enter [employer] of Steps: 4 Entrance Stairs-Rails: Left Home Layout: One level     Bathroom Shower/Tub: Teacher, early years/pre: Handicapped height     Home Equipment: Public relations account executive (2 wheels);Cane - single point          Prior Functioning/Environment Prior Level of Function : Needs assist  Cognitive Assist : Mobility (cognitive)     Physical Assist : Mobility (physical)     Mobility Comments: SBA/CGA with amb community distances with a RW, "many" falls in the last 6 moths, family unsure of how many ADLs Comments: Assist from family with bathing, Ind with dressing        OT Problem List: Decreased strength;Decreased range of motion;Decreased activity tolerance;Impaired balance (sitting and/or standing);Decreased knowledge of use of DME or AE;Decreased safety awareness      OT Treatment/Interventions: Self-care/ADL training;Therapeutic exercise;Energy conservation;DME and/or AE instruction;Therapeutic activities;Patient/family education;Balance training    OT Goals(Current goals can be found in the care plan section) Acute Rehab OT Goals Patient Stated Goal: to walk OT Goal Formulation: With patient/family Time For Goal Achievement: 12/23/21 Potential to Achieve Goals: Good ADL Goals Pt Will Perform Grooming: with set-up;with  supervision;sitting Pt Will Perform Lower Body Dressing: sit to/from stand;with min assist Pt Will Transfer to Toilet: ambulating;regular height toilet;with min guard assist  OT Frequency: Min 2X/week    Co-evaluation              AM-PAC OT "6 Clicks" Daily Activity     Outcome Measure Help from another person eating meals?: A Little Help from another person taking care of personal grooming?: A Little Help from another person toileting, which includes using toliet, bedpan, or urinal?: A Lot Help from another person bathing (including washing, rinsing, drying)?: A Lot Help from another person to put on and taking off regular upper body clothing?: A Little Help from another person to put on and taking off regular lower body clothing?: A Lot 6 Click Score: 15   End of Session Equipment Utilized During Treatment: Rolling walker (2 wheels)  Activity Tolerance: Patient tolerated treatment well Patient left: in chair;with call bell/phone within reach;with chair alarm set;with family/visitor present  OT Visit Diagnosis: Repeated falls (R29.6);Muscle weakness (generalized) (M62.81);Other abnormalities of gait and mobility (R26.89)                Time: 5916-3846 OT Time Calculation (min): 19  min Charges:  OT General Charges $OT Visit: 1 Visit OT Evaluation $OT Eval Moderate Complexity: 1 Mod OT Treatments $Self Care/Home Management : 8-22 mins  Kathie Dike, M.S. OTR/L  12/09/21, 3:19 PM  ascom [phone]

## 2021-12-09 NOTE — Progress Notes (Signed)
  Subjective: 1 Day Post-Op Procedure(s) (LRB): INTRAMEDULLARY (IM) NAIL INTERTROCHANTERIC (Right) Patient reports pain as mild.   Patient is well, and has had no acute complaints or problems Plan is to go Skilled nursing facility after hospital stay. Negative for chest pain and shortness of breath Fever: no Gastrointestinal: Negative for nausea and vomiting  Objective: Vital signs in last 24 hours: Temp:  [97.3 F (36.3 C)-99.5 F (37.5 C)] 99.5 F (37.5 C) (10/30 2331) Pulse Rate:  [79-99] 79 (10/30 2331) Resp:  [13-19] 18 (10/30 2331) BP: (130-178)/(58-90) 137/58 (10/30 2331) SpO2:  [92 %-100 %] 100 % (10/30 2331) Weight:  [41.6 kg] 41.6 kg (10/30 1223)  Intake/Output from previous day:  Intake/Output Summary (Last 24 hours) at 12/09/2021 0722 Last data filed at 12/09/2021 0400 Gross per 24 hour  Intake 1962.58 ml  Output 1450 ml  Net 512.58 ml    Intake/Output this shift: No intake/output data recorded.  Labs: Recent Labs    12/07/21 1838 12/08/21 0349 12/09/21 0433  HGB 10.3* 9.0* 8.6*   Recent Labs    12/08/21 0349 12/09/21 0433  WBC 15.1* 16.2*  RBC 2.97* 2.88*  HCT 27.3* 26.6*  PLT 138* 140*   Recent Labs    12/08/21 0349 12/09/21 0433  NA 135 140  K 3.4* 3.4*  CL 99 102  CO2 29 30  BUN 16 13  CREATININE 1.21* 0.99  GLUCOSE 105* 131*  CALCIUM 7.9* 8.1*   Recent Labs    12/07/21 1838  INR 1.2     EXAM General - Patient is Alert and Confused Extremity - Neurovascular intact Sensation intact distally Dorsiflexion/Plantar flexion intact Compartment soft Dressing/Incision - clean, dry, no drainage Motor Function - intact, moving foot and toes well on exam.   Past Medical History:  Diagnosis Date   Coronary artery disease    Dr. Nehemiah Massed    Assessment/Plan: 1 Day Post-Op Procedure(s) (LRB): INTRAMEDULLARY (IM) NAIL INTERTROCHANTERIC (Right) Principal Problem:   Femur fracture, right ([place]) Active Problems:   CAD (coronary  artery disease)   Essential hypertension, benign   Delirium   Leukocytosis   Closed right hip fracture, initial encounter ([place])   Fall   History of vertebral fracture  Estimated body mass index is 16.25 kg/m as calculated from the following:   Height as of this encounter: 5\' 3"  (1.6 m).   Weight as of this encounter: 41.6 kg. Advance diet Up with therapy  DVT Prophylaxis - Foot Pumps, TED hose, and Plavix Weight-Bearing as tolerated to right leg  Reche Dixon, PA-C Orthopaedic Surgery 12/09/2021, 7:22 AM

## 2021-12-09 NOTE — Progress Notes (Signed)
Patient has been working with PT/OT today and was able to walk outside the room . Removed foley.

## 2021-12-09 NOTE — Plan of Care (Signed)
  Problem: Education: Goal: Knowledge of General Education information will improve Description: Including pain rating scale, medication(s)/side effects and non-pharmacologic comfort measures Outcome: Progressing   Problem: Health Behavior/Discharge Planning: Goal: Ability to manage health-related needs will improve Outcome: Not Progressing   Problem: Clinical Measurements: Goal: Ability to maintain clinical measurements within normal limits will improve Outcome: Progressing Goal: Will remain free from infection Outcome: Progressing Goal: Diagnostic test results will improve Outcome: Progressing Goal: Respiratory complications will improve Outcome: Progressing Goal: Cardiovascular complication will be avoided Outcome: Progressing   Problem: Activity: Goal: Risk for activity intolerance will decrease Outcome: Progressing   Problem: Nutrition: Goal: Adequate nutrition will be maintained Outcome: Not Progressing   Problem: Coping: Goal: Level of anxiety will decrease Outcome: Progressing   Problem: Elimination: Goal: Will not experience complications related to bowel motility Outcome: Not Progressing Goal: Will not experience complications related to urinary retention Outcome: Progressing   Problem: Safety: Goal: Ability to remain free from injury will improve Outcome: Progressing   Problem: Skin Integrity: Goal: Risk for impaired skin integrity will decrease Outcome: Progressing

## 2021-12-09 NOTE — Progress Notes (Signed)
PROGRESS NOTE  Madison Brown    DOB: [birth year], 88 y.o.  BSJ:[id number]    Code Status: Partial Code   DOA: 12/07/2021   LOS: 2   Brief Brown course  Madison Brown is a 88 y.o. female with a PMH significant for CAD s/p CABG, CVA, TAVR, hypertension, hyperlipidemia, dementia.  They presented from home where she lives with her husband to the ED on 12/07/2021 with fall that resulted in hip fracture but already had a vertebral compression fracture which was due for vertebroplasty 10/31.  In the ED, it was found that they had normotensive at 122/69 with heart rate of 101.  She was saturating at 93% on room air.  After receiving morphine, patient's oxygen levels decreased to 87% and she was placed on 2 L nasal cannula. No oxygen requirements at baselin.  Significant findings included Madison Brown cervical right femoral neck fracture with substantial varus angulation. Orthopedic surgery was consulted.   Patient was admitted to medicine service for further workup and management of chronic conditions as outlined in detail below.  Hip fracture fixation 10/30.   12/09/21 -stable, improved. TOC engaged for SNF placement. She can likely dc when bed available.  Assessment & Plan  Principal Problem:   Femur fracture, right ([facility]) Active Problems:   Delirium   Leukocytosis   CAD (coronary artery disease)   Essential hypertension, benign   Closed right hip fracture, initial encounter Madison Brown)   Fall   History of vertebral fracture  Acute Femur fracture, right Madison Brown) - Orthopedic surgery consulted; appreciate their recommendations.   - fixation 10/30 - tylenol, Norco and Dilaudid for pain control - PT/OT recommending SNF  Recent L1 vertebral compression fracture- scheduled for outpatient vertebroplasty 10/31 prior to current injury. IR will not be doing inpatient and recommended rescheduling outpatient appointment. - analgesia PRN   Delirium- greatly improved  -Delirium precautions - palliative  consult, TOC engaged for placement as she will unlikely be able to go home in acute setting   CAD- - restarting plavix - Restart home atorvastatin   Essential hypertension, benign - Restart home amlodipine  Body mass index is 16.25 kg/m.  VTE ppx: SCDs Start: 12/08/21 1658  Diet:     Diet   Diet regular Room service appropriate? Yes; Fluid consistency: Thin   Consultants: Ortho surgery IR Subjective 12/09/21    Pt reports no complaints. She is currently standing with PT.   Objective   Vitals:   12/08/21 1630 12/08/21 1659 12/08/21 2006 12/08/21 2331  BP: (!) 154/70 (!) 158/70 139/73 (!) 137/58  Pulse: 84 88 89 79  Resp: 13 18 19 18   Temp: (!) 97.4 F (36.3 C) 98.4 F (36.9 C)  99.5 F (37.5 C)  TempSrc:  Axillary  Axillary  SpO2: 100% 97%  100%  Weight:      Height:        Intake/Output Summary (Last 24 hours) at 12/09/2021 0804 Last data filed at 12/09/2021 0400 Gross per 24 hour  Intake 1962.58 ml  Output 1450 ml  Net 512.58 ml    Filed Weights   12/08/21 0152 12/08/21 1223  Weight: 41.6 kg 41.6 kg     Physical Exam:  General: awake, alert, NAD Respiratory: normal respiratory effort. Cardiovascular: quick capillary refill, normal S1/S2, RRR, no JVD, murmurs Gastrointestinal: soft, NT, ND Nervous: A&O x3. no gross focal neurologic deficits, normal speech Extremities: negative edema Skin: dry, intact, normal temperature, normal color. No rashes, lesions or ulcers on exposed skin Psychiatry: normal  mood  Labs   I have personally reviewed the following labs and imaging studies CBC    Component Value Date/Time   WBC 16.2 (H) 12/09/2021 0433   RBC 2.88 (L) 12/09/2021 0433   HGB 8.6 (L) 12/09/2021 0433   HGB 12.1 02/23/2014 1040   HCT 26.6 (L) 12/09/2021 0433   HCT 36.5 02/23/2014 1040   PLT 140 (L) 12/09/2021 0433   PLT 142 (L) 02/23/2014 1040   MCV 92.4 12/09/2021 0433   MCV 91 02/23/2014 1040   MCH 29.9 12/09/2021 0433   MCHC 32.3  12/09/2021 0433   RDW 12.5 12/09/2021 0433   RDW 12.8 02/23/2014 1040   LYMPHSABS 1.3 12/08/2021 0349   LYMPHSABS 1.9 02/23/2014 1040   MONOABS 1.3 (H) 12/08/2021 0349   MONOABS 0.8 02/23/2014 1040   EOSABS 0.1 12/08/2021 0349   EOSABS 0.2 02/23/2014 1040   BASOSABS 0.1 12/08/2021 0349   BASOSABS 0.1 02/23/2014 1040      Latest Ref Rng & Units 12/09/2021    4:33 AM 12/08/2021    3:49 AM 12/07/2021    6:38 PM  BMP  Glucose 70 - 99 mg/dL 536  644  034   BUN 8 - 23 mg/dL 13  16  18    Creatinine 0.44 - 1.00 mg/dL  7.42  5.95   Sodium 135 - 145 mmol/L 140  135  134   Potassium 3.5 - 5.1 mmol/L 3.4  3.4  3.4   Chloride 98 - 111 mmol/L 102  99  97   CO2 22 - 32 mmol/L 30  29  28    Calcium 8.9 - 10.3 mg/dL 8.1  7.9  8.3     DG C-Arm 1-60 Min-No Report  Result Date: 12/08/2021 CLINICAL DATA:  Surgical internal fixation right femur fracture. EXAM: DG HIP (WITH OR WITHOUT PELVIS) 2-3V RIGHT; DG C-ARM 1-60 MIN-NO REPORT COMPARISON:  December 07, 2021. FINDINGS: Six intraoperative fluoroscopic images were obtained of the right hip. These demonstrate the surgical internal fixation of proximal right femoral neck fracture. IMPRESSION: Fluoroscopic guidance provided during the surgical internal fixation of proximal right femoral neck fracture. Electronically Signed   By: 12/10/2021 M.D.   On: 12/08/2021 15:57   DG HIP UNILAT WITH PELVIS 2-3 VIEWS RIGHT  Result Date: 12/08/2021 CLINICAL DATA:  Surgical internal fixation right femur fracture. EXAM: DG HIP (WITH OR WITHOUT PELVIS) 2-3V RIGHT; DG C-ARM 1-60 MIN-NO REPORT COMPARISON:  December 07, 2021. FINDINGS: Six intraoperative fluoroscopic images were obtained of the right hip. These demonstrate the surgical internal fixation of proximal right femoral neck fracture. IMPRESSION: Fluoroscopic guidance provided during the surgical internal fixation of proximal right femoral neck fracture. Electronically Signed   By: 12/10/2021 M.D.   On:  12/08/2021 15:57   DG Chest 1 View  Result Date: 12/07/2021 CLINICAL DATA:  Unwitnessed fall.  Dementia. EXAM: CHEST  1 VIEW COMPARISON:  11/25/2020 FINDINGS: Prior CABG. Atherosclerotic calcification of the aortic arch. Upper normal heart size. The lungs appear clear.  Bony demineralization is present. IMPRESSION: 1. No active cardiopulmonary disease is radiographically apparent. 2. Prior CABG. 3. Bony demineralization. Electronically Signed   By: 12/09/2021 M.D.   On: 12/07/2021 15:12   DG Hip Unilat With Pelvis 2-3 Views Left  Result Date: 12/07/2021 CLINICAL DATA:  Unwitnessed fall, hip pain, dementia. EXAM: DG HIP (WITH OR WITHOUT PELVIS) 2-3V LEFT COMPARISON:  CT pelvis 12/01/2021 FINDINGS: Bony demineralization. No left hip fracture is identified. Atherosclerosis noted.  Patient has a known right basicervical femoral assessed with dedicated right hip imaging. IMPRESSION: 1. No LEFT hip fracture identified, the patient has a known right basicervical femoral neck fracture. 2. Bony demineralization. 3. Atherosclerosis. Electronically Signed   By: Gaylyn Rong M.D.   On: 12/07/2021 15:10   DG Lumbar Spine Complete  Result Date: 12/07/2021 CLINICAL DATA:  Fall, right hip pain. Known L1 compression fracture. EXAM: LUMBAR SPINE - COMPLETE 4+ VIEW COMPARISON:  Lumbar CT 12/01/2021 FINDINGS: Bony demineralization. Bilateral degenerative facet arthropathy at L4-5 at L5-S1. Stable 60% compression fracture at L1 with some middle column involvement based on the mild posterior bony retropulsion which, like Madison Brown before, amounts to about 0.4 cm. Stable grade 1 degenerative anterolisthesis L4-5. Stable loss of intervertebral disc height at L4-5 and L5-S1. No new fracture is readily apparent. Atherosclerosis is present, including aortoiliac atherosclerotic disease. IMPRESSION: 1. Stable 60% compression fracture at L1 with some middle column involvement. 2. Stable grade 1 degenerative anterolisthesis  at L4-5. 3. Bony demineralization. 4. Aortoiliac atherosclerotic disease. Electronically Signed   By: Gaylyn Rong M.D.   On: 12/07/2021 15:08   DG Hip Unilat  With Pelvis 2-3 Views Right  Result Date: 12/07/2021 CLINICAL DATA:  Unwitnessed fall.  Dementia.  Right hip pain. EXAM: DG HIP (WITH OR WITHOUT PELVIS) 2-3V RIGHT COMPARISON:  CT pelvis 12/01/2021 FINDINGS: New basicervical right femoral neck fracture with substantial varus angulation. Questionable extension into the greater trochanter. Bony demineralization. Atherosclerotic vascular calcifications. IMPRESSION: 1. New basicervical right femoral neck fracture with substantial varus angulation. Questionable extension into the greater trochanter. 2. Bony demineralization. Electronically Signed   By: Gaylyn Rong M.D.   On: 12/07/2021 15:06   CT Head Wo Contrast  Result Date: 12/07/2021 CLINICAL DATA:  Altered mental status.  Trauma.  Fall. EXAM: CT HEAD WITHOUT CONTRAST CT CERVICAL SPINE WITHOUT CONTRAST TECHNIQUE: Multidetector CT imaging of the head and cervical spine was performed following the standard protocol without intravenous contrast. Multiplanar CT image reconstructions of the cervical spine were also generated. RADIATION DOSE REDUCTION: This exam was performed according to the departmental dose-optimization program which includes automated exposure control, adjustment of the mA and/or kV according to patient size and/or use of iterative reconstruction technique. COMPARISON:  CT scan of the brain November 25, 2020. FINDINGS: CT HEAD FINDINGS Brain: No subdural, epidural, or subarachnoid hemorrhage. Multiple small cerebellar infarcts, unchanged. Cerebellum, brainstem, and basal cisterns otherwise normal. Remote bilateral occipital infarcts. White matter changes. Prominence of ventricles and sulci, stable. No acute cortical ischemia or infarct identified on today's study. No mass effect or midline shift. Vascular: No hyperdense  vessel or unexpected calcification. Skull: Normal. Negative for fracture or focal lesion. Sinuses/Orbits: No acute finding. Other: None. CT CERVICAL SPINE FINDINGS Alignment: Trace retrolisthesis of C3 versus C2 and C4. Trace anterolisthesis of C7 versus T1. Trace anterolisthesis of T1 versus T2. No other malalignment. Skull base and vertebrae: No acute fracture. No primary bone lesion or focal pathologic process. Soft tissues and spinal canal: No prevertebral fluid or swelling. No visible canal hematoma. Disc levels: Multilevel degenerative disc disease and facet degenerative changes. Upper chest: Negative. Other: No other abnormalities. IMPRESSION: 1. No acute intracranial abnormalities. 2. No fracture or traumatic malalignment in the cervical spine. 3. Multilevel degenerative changes. Electronically Signed   By: Gerome Sam III M.D.   On: 12/07/2021 14:41   CT Cervical Spine Wo Contrast  Result Date: 12/07/2021 CLINICAL DATA:  Altered mental status.  Trauma.  Fall. EXAM: CT HEAD  WITHOUT CONTRAST CT CERVICAL SPINE WITHOUT CONTRAST TECHNIQUE: Multidetector CT imaging of the head and cervical spine was performed following the standard protocol without intravenous contrast. Multiplanar CT image reconstructions of the cervical spine were also generated. RADIATION DOSE REDUCTION: This exam was performed according to the departmental dose-optimization program which includes automated exposure control, adjustment of the mA and/or kV according to patient size and/or use of iterative reconstruction technique. COMPARISON:  CT scan of the brain November 25, 2020. FINDINGS: CT HEAD FINDINGS Brain: No subdural, epidural, or subarachnoid hemorrhage. Multiple small cerebellar infarcts, unchanged. Cerebellum, brainstem, and basal cisterns otherwise normal. Remote bilateral occipital infarcts. White matter changes. Prominence of ventricles and sulci, stable. No acute cortical ischemia or infarct identified on today's  study. No mass effect or midline shift. Vascular: No hyperdense vessel or unexpected calcification. Skull: Normal. Negative for fracture or focal lesion. Sinuses/Orbits: No acute finding. Other: None. CT CERVICAL SPINE FINDINGS Alignment: Trace retrolisthesis of C3 versus C2 and C4. Trace anterolisthesis of C7 versus T1. Trace anterolisthesis of T1 versus T2. No other malalignment. Skull base and vertebrae: No acute fracture. No primary bone lesion or focal pathologic process. Soft tissues and spinal canal: No prevertebral fluid or swelling. No visible canal hematoma. Disc levels: Multilevel degenerative disc disease and facet degenerative changes. Upper chest: Negative. Other: No other abnormalities. IMPRESSION: 1. No acute intracranial abnormalities. 2. No fracture or traumatic malalignment in the cervical spine. 3. Multilevel degenerative changes. Electronically Signed   By: Dorise Bullion III M.D.   On: 12/07/2021 14:41    Disposition Plan & Communication  Patient status: Inpatient  Admitted From: Home Planned disposition location: SNF Anticipated discharge date: 11/2  Family Communication: daughter and daughter in law at bedside    Author: Richarda Osmond, DO [facility] 12/09/2021, 8:04 AM   Available by Epic secure chat 7AM-7PM. If 7PM-7AM, please contact night-coverage.  [facility] contact information found on [email or web address].

## 2021-12-10 DIAGNOSIS — R410 Disorientation, unspecified: Secondary | ICD-10-CM | POA: Diagnosis not present

## 2021-12-10 DIAGNOSIS — I10 Essential (primary) hypertension: Secondary | ICD-10-CM | POA: Diagnosis not present

## 2021-12-10 DIAGNOSIS — Z7189 Other specified counseling: Secondary | ICD-10-CM | POA: Diagnosis not present

## 2021-12-10 DIAGNOSIS — S72001A Fracture of unspecified part of neck of right femur, initial encounter for closed fracture: Secondary | ICD-10-CM | POA: Diagnosis not present

## 2021-12-10 DIAGNOSIS — D72829 Elevated white blood cell count, unspecified: Secondary | ICD-10-CM

## 2021-12-10 DIAGNOSIS — I2510 Atherosclerotic heart disease of native coronary artery without angina pectoris: Secondary | ICD-10-CM | POA: Diagnosis not present

## 2021-12-10 LAB — CBC
HCT: 25.8 % — ABNORMAL LOW (ref 36.0–46.0)
Hemoglobin: 8.4 g/dL — ABNORMAL LOW (ref 12.0–15.0)
MCH: 29.9 pg (ref 26.0–34.0)
MCHC: 32.6 g/dL (ref 30.0–36.0)
MCV: 91.8 fL (ref 80.0–100.0)
Platelets: 171 10*3/uL (ref 150–400)
RBC: 2.81 MIL/uL — ABNORMAL LOW (ref 3.87–5.11)
RDW: 12.8 % (ref 11.5–15.5)
WBC: 15.3 10*3/uL — ABNORMAL HIGH (ref 4.0–10.5)
nRBC: 0 % (ref 0.0–0.2)

## 2021-12-10 LAB — BASIC METABOLIC PANEL
Anion gap: 5 (ref 5–15)
BUN: 18 mg/dL (ref 8–23)
CO2: 30 mmol/L (ref 22–32)
Calcium: 7.9 mg/dL — ABNORMAL LOW (ref 8.9–10.3)
Chloride: 105 mmol/L (ref 98–111)
Creatinine, Ser: 1.03 mg/dL — ABNORMAL HIGH (ref 0.44–1.00)
GFR, Estimated: 52 mL/min — ABNORMAL LOW (ref >60)
Glucose, Bld: 121 mg/dL — ABNORMAL HIGH (ref 70–99)
Potassium: 2.9 mmol/L — ABNORMAL LOW (ref 3.5–5.1)
Sodium: 140 mmol/L (ref 135–145)

## 2021-12-10 MED ORDER — OXYCODONE HCL 5 MG PO TABS
2.5000 mg | ORAL_TABLET | ORAL | Status: DC | PRN
  Administered 2021-12-11: 5 mg via ORAL
  Filled 2021-12-10: qty 1

## 2021-12-10 MED ORDER — AMLODIPINE BESYLATE 5 MG PO TABS
5.0000 mg | ORAL_TABLET | Freq: Every day | ORAL | Status: DC
  Administered 2021-12-10 – 2021-12-11 (×2): 5 mg via ORAL
  Filled 2021-12-10 (×2): qty 1

## 2021-12-10 MED ORDER — ATORVASTATIN CALCIUM 20 MG PO TABS
40.0000 mg | ORAL_TABLET | Freq: Every day | ORAL | Status: DC
  Administered 2021-12-10 – 2021-12-11 (×2): 40 mg via ORAL
  Filled 2021-12-10 (×2): qty 2

## 2021-12-10 MED ORDER — POTASSIUM CHLORIDE CRYS ER 20 MEQ PO TBCR
40.0000 meq | EXTENDED_RELEASE_TABLET | Freq: Two times a day (BID) | ORAL | Status: AC
  Administered 2021-12-10: 40 meq via ORAL
  Filled 2021-12-10 (×2): qty 2

## 2021-12-10 MED ORDER — TRAMADOL HCL 50 MG PO TABS
50.0000 mg | ORAL_TABLET | Freq: Four times a day (QID) | ORAL | Status: DC | PRN
  Administered 2021-12-11: 50 mg via ORAL
  Filled 2021-12-10 (×2): qty 1

## 2021-12-10 NOTE — Progress Notes (Signed)
PROGRESS NOTE    Madison Brown   HYQ:[id number] DOB: [birth year]  DOA: 12/07/2021 Date of Service: 12/10/21 PCP: Gladstone Lighter, MD     Brief Narrative / Hospital Course:  Madison Brown is a 88 y.o. female with a PMH significant for CAD s/p CABG, CVA, TAVR, hypertension, hyperlipidemia, dementia. They presented from home where she lives with her husband to the ED on 12/07/2021 with fall that resulted in hip fracture but already had a vertebral compression fracture which was due for vertebroplasty 10/31.  10/29: In the ED, it was found that they had normotensive at 122/69 with heart rate of 101.  She was saturating at 93% on room air.  After receiving morphine, patient's oxygen levels decreased to 87% and she was placed on 2 L nasal cannula. No oxygen requirements at baseline. (+)Right femoral neck fracture with substantial varus angulation. Orthopedic surgery was consulted.  10/30: Hip fracture fixation.  12/09/21 -stable, improved. TOC engaged for SNF placement.   Consultants:  Orthopedics   Procedures: 10/30: Hip fracture fixation.       ASSESSMENT & PLAN:   Principal Problem:   Femur fracture, right (HCC) Active Problems:   Delirium   Leukocytosis   CAD (coronary artery disease)   Essential hypertension, benign   Closed right hip fracture, initial encounter [facility])   Fall   History of vertebral fracture   Acute Femur fracture, right ([place]) Orthopedic surgery consulted S/p fixation 10/30 tylenol, Robaxin, Tramadol, Norco for pain control PT/OT recommending SNF - auth pending   Recent L1 vertebral compression fracture-  scheduled for outpatient vertebroplasty 10/31 prior to current injury.  IR will not be doing inpatient and recommended rescheduling outpatient appointment. analgesia PRN   Delirium-  greatly improved  delirium precautions palliative consult TOC engaged for placement as she will unlikely be able to go home in acute setting    CAD- restarting plavix Restart home atorvastatin   Essential hypertension, benign Restart home amlodipine   Body mass index is 16.25 kg/m.     DVT prophylaxis: SCD Pertinent IV fluids/nutrition: d/c IV fluids  Central lines / invasive devices: none  Code Status: intubation is acceptable, no CPR/defibrillation/ACLS Family Communication: none at thist eim  Disposition: inpatient  TOC needs: SNF Barriers to discharge / significant pending items: pending SNF authorzation              Subjective:  Patient reports no concerns today, pain is a bit better from yesterday        Objective:  Vitals:   12/08/21 2331 12/09/21 2054 12/10/21 0050 12/10/21 0835  BP: (!) 137/58 (!) 152/59 (!) 145/68 (!) 151/69  Pulse: 79 83 82 98  Resp: 18 18 18 14   Temp: 99.5 F (37.5 C) 98.5 F (36.9 C) 97.9 F (36.6 C) 97.9 F (36.6 C)  TempSrc: Axillary Oral Oral   SpO2: 100% 98% 96% 90%  Weight:      Height:        Intake/Output Summary (Last 24 hours) at 12/10/2021 1558 Last data filed at 12/09/2021 1714 Gross per 24 hour  Intake 91.98 ml  Output 350 ml  Net -258.02 ml   Filed Weights   12/08/21 0152 12/08/21 1223  Weight: 41.6 kg 41.6 kg    Examination:  Constitutional:  VS as above General Appearance: alert, well-developed, well-nourished, NAD Respiratory: Normal respiratory effort No rales Cardiovascular: S1/S2 normal No murmur No lower extremity edema Gastrointestinal: No tenderness No masses No hernia appreciated Musculoskeletal:  No clubbing/cyanosis of  digits Neurological: No cranial nerve deficit on limited exam Alert Psychiatric: Normal judgment/insight Normal mood and affect       Scheduled Medications:   acetaminophen  1,000 mg Oral Q8H   amLODipine  5 mg Oral Daily   aspirin EC  325 mg Oral Daily   atorvastatin  40 mg Oral Daily   clopidogrel  75 mg Oral Daily   feeding supplement  237 mL Oral BID BM   feeding supplement   237 mL Oral TID BM   multivitamin with minerals  1 tablet Oral Daily   potassium chloride  40 mEq Oral BID    Continuous Infusions:   PRN Medications:  acetaminophen, bisacodyl, methocarbamol **OR** [DISCONTINUED] methocarbamol (ROBAXIN) IV, metoCLOPramide **OR** metoCLOPramide (REGLAN) injection, ondansetron **OR** ondansetron (ZOFRAN) IV, oxyCODONE, sodium phosphate, traMADol  Antimicrobials:  Anti-infectives (From admission, onward)    Start     Dose/Rate Route Frequency Ordered Stop   12/10/21 1000  vancomycin (VANCOREADY) IVPB 750 mg/150 mL  Status:  Discontinued        750 mg 150 mL/hr over 60 Minutes Intravenous Every 48 hours 12/08/21 0219 12/08/21 1657   12/08/21 2000  ceFAZolin (ANCEF) IVPB 2g/100 mL premix        2 g 200 mL/hr over 30 Minutes Intravenous Every 6 hours 12/08/21 1657 12/09/21 1115   12/08/21 0245  vancomycin (VANCOCIN) IVPB 1000 mg/200 mL premix        1,000 mg 200 mL/hr over 60 Minutes Intravenous  Once 12/08/21 0151 12/08/21 0453   12/08/21 0245  ceFEPIme (MAXIPIME) 2 g in sodium chloride 0.9 % 100 mL IVPB  Status:  Discontinued        2 g 200 mL/hr over 30 Minutes Intravenous Every 24 hours 12/08/21 0153 12/08/21 1657   12/08/21 0230  metroNIDAZOLE (FLAGYL) IVPB 500 mg  Status:  Discontinued        500 mg 100 mL/hr over 60 Minutes Intravenous Every 12 hours 12/08/21 0123 12/08/21 1657   12/07/21 1745  ceFAZolin (ANCEF) IVPB 2g/100 mL premix        2 g 200 mL/hr over 30 Minutes Intravenous  Once 12/07/21 1736 12/07/21 2106       Data Reviewed: I have personally reviewed following labs and imaging studies  CBC: Recent Labs  Lab 12/07/21 1838 12/08/21 0349 12/09/21 0433 12/10/21 0637  WBC 18.1* 15.1* 16.2* 15.3*  NEUTROABS 15.6* 12.3*  --   --   HGB 10.3* 9.0* 8.6* 8.4*  HCT 30.4* 27.3* 26.6* 25.8*  MCV 88.1 91.9 92.4 91.8  PLT 156 138* 140* 171   Basic Metabolic Panel: Recent Labs  Lab 12/07/21 1838 12/08/21 0349 12/09/21 0433  12/10/21 0637  NA 134* 135 140 140  K 3.4* 3.4* 3.4* 2.9*  CL 97* 99 102 105  CO2 28 29 30 30   GLUCOSE 138* 105* 131* 121*  BUN 18 16 13 18   CREATININE 1.22* 1.21* 0.99 1.03*  CALCIUM 8.3* 7.9* 8.1* 7.9*   GFR: Estimated Creatinine Clearance: 23.8 mL/min (A) (by C-G formula based on SCr of 1.03 mg/dL (H)). Liver Function Tests: Recent Labs  Lab 12/07/21 1838  AST 30  ALT 27  ALKPHOS 122  BILITOT 1.0  PROT 6.4*  ALBUMIN 3.2*   No results for input(s): "LIPASE", "AMYLASE" in the last 168 hours. No results for input(s): "AMMONIA" in the last 168 hours. Coagulation Profile: Recent Labs  Lab 12/07/21 1838  INR 1.2   Cardiac Enzymes: No results for input(s): "CKTOTAL", "CKMB", "CKMBINDEX", "  TROPONINI" in the last 168 hours. BNP (last 3 results) No results for input(s): "PROBNP" in the last 8760 hours. HbA1C: No results for input(s): "HGBA1C" in the last 72 hours. CBG: No results for input(s): "GLUCAP" in the last 168 hours. Lipid Profile: No results for input(s): "CHOL", "HDL", "LDLCALC", "TRIG", "CHOLHDL", "LDLDIRECT" in the last 72 hours. Thyroid Function Tests: No results for input(s): "TSH", "T4TOTAL", "FREET4", "T3FREE", "THYROIDAB" in the last 72 hours. Anemia Panel: No results for input(s): "VITAMINB12", "FOLATE", "FERRITIN", "TIBC", "IRON", "RETICCTPCT" in the last 72 hours. Urine analysis:    Component Value Date/Time   COLORURINE YELLOW (A) 12/07/2021 2359   APPEARANCEUR CLEAR (A) 12/07/2021 2359   LABSPEC 1.019 12/07/2021 2359   PHURINE 6.0 12/07/2021 2359   GLUCOSEU NEGATIVE 12/07/2021 2359   GLUCOSEU NEGATIVE 08/14/2016 1029   HGBUR NEGATIVE 12/07/2021 2359   BILIRUBINUR NEGATIVE 12/07/2021 2359   BILIRUBINUR neg 12/31/2011 1501   KETONESUR NEGATIVE 12/07/2021 2359   PROTEINUR 30 (A) 12/07/2021 2359   UROBILINOGEN 0.2 08/14/2016 1029   NITRITE NEGATIVE 12/07/2021 2359   LEUKOCYTESUR NEGATIVE 12/07/2021 2359   Sepsis  Labs: @LABRCNTIP (procalcitonin:4,lacticidven:4)  Recent Results (from the past 240 hour(s))  Culture, blood (Routine X 2) w Reflex to ID Panel     Status: None (Preliminary result)   Collection Time: 12/08/21 12:15 AM   Specimen: BLOOD  Result Value Ref Range Status   Specimen Description BLOOD BLOOD RIGHT ARM  Final   Special Requests   Final    BOTTLES DRAWN AEROBIC AND ANAEROBIC Blood Culture adequate volume   Culture   Final    NO GROWTH 2 DAYS Performed at [facility], [address] [place], [place]    Report Status PENDING  Incomplete  Culture, blood (Routine X 2) w Reflex to ID Panel     Status: None (Preliminary result)   Collection Time: 12/08/21 12:15 AM   Specimen: BLOOD  Result Value Ref Range Status   Specimen Description BLOOD BLOOD RIGHT HAND  Final   Special Requests   Final    BOTTLES DRAWN AEROBIC AND ANAEROBIC Blood Culture adequate volume   Culture   Final    NO GROWTH 2 DAYS Performed at [facility], [address] [place], [place]    Report Status PENDING  Incomplete  MRSA Next Gen by PCR, Nasal     Status: None   Collection Time: 12/08/21  1:55 AM   Specimen: Nasal Mucosa; Nasal Swab  Result Value Ref Range Status   MRSA by PCR Next Gen NOT DETECTED NOT DETECTED Final    Comment: (NOTE) The GeneXpert MRSA Assay (FDA approved for NASAL specimens only), is one component of a comprehensive MRSA colonization surveillance program. It is not intended to diagnose MRSA infection nor to guide or monitor treatment for MRSA infections. Test performance is not FDA approved in patients less than 66 years old. Performed at [facility], [address] [place], [place]          Radiology Studies: DG C-Arm 1-60 Min-No Report  Result Date: 12/08/2021 CLINICAL DATA:  Surgical internal fixation right femur fracture. EXAM: DG HIP (WITH OR WITHOUT PELVIS) 2-3V RIGHT; DG C-ARM 1-60 MIN-NO REPORT  COMPARISON:  December 07, 2021. FINDINGS: Six intraoperative fluoroscopic images were obtained of the right hip. These demonstrate the surgical internal fixation of proximal right femoral neck fracture. IMPRESSION: Fluoroscopic guidance provided during the surgical internal fixation of proximal right femoral neck fracture. Electronically Signed  By: Lupita Raider M.D.   On: 12/08/2021 15:57   DG HIP UNILAT WITH PELVIS 2-3 VIEWS RIGHT  Result Date: 12/08/2021 CLINICAL DATA:  Surgical internal fixation right femur fracture. EXAM: DG HIP (WITH OR WITHOUT PELVIS) 2-3V RIGHT; DG C-ARM 1-60 MIN-NO REPORT COMPARISON:  December 07, 2021. FINDINGS: Six intraoperative fluoroscopic images were obtained of the right hip. These demonstrate the surgical internal fixation of proximal right femoral neck fracture. IMPRESSION: Fluoroscopic guidance provided during the surgical internal fixation of proximal right femoral neck fracture. Electronically Signed   By: Lupita Raider M.D.   On: 12/08/2021 15:57   DG Chest 1 View  Result Date: 12/07/2021 CLINICAL DATA:  Unwitnessed fall.  Dementia. EXAM: CHEST  1 VIEW COMPARISON:  11/25/2020 FINDINGS: Prior CABG. Atherosclerotic calcification of the aortic arch. Upper normal heart size. The lungs appear clear.  Bony demineralization is present. IMPRESSION: 1. No active cardiopulmonary disease is radiographically apparent. 2. Prior CABG. 3. Bony demineralization. Electronically Signed   By: Gaylyn Rong M.D.   On: 12/07/2021 15:12   DG Hip Unilat With Pelvis 2-3 Views Left  Result Date: 12/07/2021 CLINICAL DATA:  Unwitnessed fall, hip pain, dementia. EXAM: DG HIP (WITH OR WITHOUT PELVIS) 2-3V LEFT COMPARISON:  CT pelvis 12/01/2021 FINDINGS: Bony demineralization. No left hip fracture is identified. Atherosclerosis noted. Patient has a known right basicervical femoral assessed with dedicated right hip imaging. IMPRESSION: 1. No LEFT hip fracture identified, the patient  has a known right basicervical femoral neck fracture. 2. Bony demineralization. 3. Atherosclerosis. Electronically Signed   By: Gaylyn Rong M.D.   On: 12/07/2021 15:10   DG Lumbar Spine Complete  Result Date: 12/07/2021 CLINICAL DATA:  Fall, right hip pain. Known L1 compression fracture. EXAM: LUMBAR SPINE - COMPLETE 4+ VIEW COMPARISON:  Lumbar CT 12/01/2021 FINDINGS: Bony demineralization. Bilateral degenerative facet arthropathy at L4-5 at L5-S1. Stable 60% compression fracture at L1 with some middle column involvement based on the mild posterior bony retropulsion which, like love before, amounts to about 0.4 cm. Stable grade 1 degenerative anterolisthesis L4-5. Stable loss of intervertebral disc height at L4-5 and L5-S1. No new fracture is readily apparent. Atherosclerosis is present, including aortoiliac atherosclerotic disease. IMPRESSION: 1. Stable 60% compression fracture at L1 with some middle column involvement. 2. Stable grade 1 degenerative anterolisthesis at L4-5. 3. Bony demineralization. 4. Aortoiliac atherosclerotic disease. Electronically Signed   By: Gaylyn Rong M.D.   On: 12/07/2021 15:08   DG Hip Unilat  With Pelvis 2-3 Views Right  Result Date: 12/07/2021 CLINICAL DATA:  Unwitnessed fall.  Dementia.  Right hip pain. EXAM: DG HIP (WITH OR WITHOUT PELVIS) 2-3V RIGHT COMPARISON:  CT pelvis 12/01/2021 FINDINGS: New basicervical right femoral neck fracture with substantial varus angulation. Questionable extension into the greater trochanter. Bony demineralization. Atherosclerotic vascular calcifications. IMPRESSION: 1. New basicervical right femoral neck fracture with substantial varus angulation. Questionable extension into the greater trochanter. 2. Bony demineralization. Electronically Signed   By: Gaylyn Rong M.D.   On: 12/07/2021 15:06   CT Head Wo Contrast  Result Date: 12/07/2021 CLINICAL DATA:  Altered mental status.  Trauma.  Fall. EXAM: CT HEAD WITHOUT  CONTRAST CT CERVICAL SPINE WITHOUT CONTRAST TECHNIQUE: Multidetector CT imaging of the head and cervical spine was performed following the standard protocol without intravenous contrast. Multiplanar CT image reconstructions of the cervical spine were also generated. RADIATION DOSE REDUCTION: This exam was performed according to the departmental dose-optimization program which includes automated exposure control, adjustment of the  mA and/or kV according to patient size and/or use of iterative reconstruction technique. COMPARISON:  CT scan of the brain November 25, 2020. FINDINGS: CT HEAD FINDINGS Brain: No subdural, epidural, or subarachnoid hemorrhage. Multiple small cerebellar infarcts, unchanged. Cerebellum, brainstem, and basal cisterns otherwise normal. Remote bilateral occipital infarcts. White matter changes. Prominence of ventricles and sulci, stable. No acute cortical ischemia or infarct identified on today's study. No mass effect or midline shift. Vascular: No hyperdense vessel or unexpected calcification. Skull: Normal. Negative for fracture or focal lesion. Sinuses/Orbits: No acute finding. Other: None. CT CERVICAL SPINE FINDINGS Alignment: Trace retrolisthesis of C3 versus C2 and C4. Trace anterolisthesis of C7 versus T1. Trace anterolisthesis of T1 versus T2. No other malalignment. Skull base and vertebrae: No acute fracture. No primary bone lesion or focal pathologic process. Soft tissues and spinal canal: No prevertebral fluid or swelling. No visible canal hematoma. Disc levels: Multilevel degenerative disc disease and facet degenerative changes. Upper chest: Negative. Other: No other abnormalities. IMPRESSION: 1. No acute intracranial abnormalities. 2. No fracture or traumatic malalignment in the cervical spine. 3. Multilevel degenerative changes. Electronically Signed   By: Gerome Samavid  Williams III M.D.   On: 12/07/2021 14:41   CT Cervical Spine Wo Contrast  Result Date: 12/07/2021 CLINICAL DATA:   Altered mental status.  Trauma.  Fall. EXAM: CT HEAD WITHOUT CONTRAST CT CERVICAL SPINE WITHOUT CONTRAST TECHNIQUE: Multidetector CT imaging of the head and cervical spine was performed following the standard protocol without intravenous contrast. Multiplanar CT image reconstructions of the cervical spine were also generated. RADIATION DOSE REDUCTION: This exam was performed according to the departmental dose-optimization program which includes automated exposure control, adjustment of the mA and/or kV according to patient size and/or use of iterative reconstruction technique. COMPARISON:  CT scan of the brain November 25, 2020. FINDINGS: CT HEAD FINDINGS Brain: No subdural, epidural, or subarachnoid hemorrhage. Multiple small cerebellar infarcts, unchanged. Cerebellum, brainstem, and basal cisterns otherwise normal. Remote bilateral occipital infarcts. White matter changes. Prominence of ventricles and sulci, stable. No acute cortical ischemia or infarct identified on today's study. No mass effect or midline shift. Vascular: No hyperdense vessel or unexpected calcification. Skull: Normal. Negative for fracture or focal lesion. Sinuses/Orbits: No acute finding. Other: None. CT CERVICAL SPINE FINDINGS Alignment: Trace retrolisthesis of C3 versus C2 and C4. Trace anterolisthesis of C7 versus T1. Trace anterolisthesis of T1 versus T2. No other malalignment. Skull base and vertebrae: No acute fracture. No primary bone lesion or focal pathologic process. Soft tissues and spinal canal: No prevertebral fluid or swelling. No visible canal hematoma. Disc levels: Multilevel degenerative disc disease and facet degenerative changes. Upper chest: Negative. Other: No other abnormalities. IMPRESSION: 1. No acute intracranial abnormalities. 2. No fracture or traumatic malalignment in the cervical spine. 3. Multilevel degenerative changes. Electronically Signed   By: Gerome Samavid  Williams III M.D.   On: 12/07/2021 14:41             LOS: 3 days      Sunnie NielsenNatalie Addisen Chappelle, DO [facility] 12/10/2021, 3:58 PM   Staff may message me via secure chat in Epic  but this may not receive immediate response,  please page for urgent matters!  If 7PM-7AM, please contact night-coverage [email or web address]  Dictation software was used to generate the above note. Typos may occur and escape review, as with typed/written notes. Please contact Dr Lyn HollingsheadAlexander directly for clarity if needed.

## 2021-12-10 NOTE — Progress Notes (Signed)
Occupational Therapy Treatment Patient Details Name: Madison Brown MRN: [id number] DOB: 26-Feb-1929 Today's Date: 12/10/2021   History of present illness Pt is a 88 y.o. female with medical history significant for CAD s/p CABG, CVA, TAVR, hypertension, hyperlipidemia who presents to the ED after ground-level fall.  Of note pt with recent L1 compression fracture with patient scheduled for kyphoplasty prior to fall. MD assessment includes: Right intertrochanteric hip fracture now s/p intramedullary nailing, delirium, and leukocytosis.   OT comments  Ms Sharma was seen for OT treatment on this date. Upon arrival to room pt reclined in chair, family at bed side, agreeable to tx. Pt requires MIN A + RW sit<>stand (multiple posterior LOBs noted) and for functional mobility, assist to manage RW. MOD A don underwear sit<>stand, assist for dynamic standing balance and threading over RLE. Pt making good progress toward goals, will continue to follow POC. Discharge recommendation remains appropriate.     Recommendations for follow up therapy are one component of a multi-disciplinary discharge planning process, led by the attending physician.  Recommendations may be updated based on patient status, additional functional criteria and insurance authorization.    Follow Up Recommendations  Skilled nursing-short term rehab (<3 hours/day)    Assistance Recommended at Discharge Frequent or constant Supervision/Assistance  Patient can return home with the following  A lot of help with walking and/or transfers;A lot of help with bathing/dressing/bathroom   Equipment Recommendations  BSC/3in1    Recommendations for Other Services      Precautions / Restrictions Precautions Precautions: Fall Restrictions Weight Bearing Restrictions: Yes RLE Weight Bearing: Weight bearing as tolerated       Mobility Bed Mobility               General bed mobility comments: received and left in recliner     Transfers Overall transfer level: Needs assistance Equipment used: Rolling walker (2 wheels) Transfers: Sit to/from Stand Sit to Stand: Min assist           General transfer comment: heavy posterior lean in standing with MOD post LOB.     Balance Overall balance assessment: Needs assistance, History of Falls Sitting-balance support: No upper extremity supported, Feet supported Sitting balance-Leahy Scale: Fair     Standing balance support: No upper extremity supported, During functional activity Standing balance-Leahy Scale: Poor                             ADL either performed or assessed with clinical judgement   ADL Overall ADL's : Needs assistance/impaired                                       General ADL Comments: MIN A + RW for simulated toilet t/f. MOD A don underwear sit<>stand, assist for dynamic standing balance and threading over RLE    Extremity/Trunk Assessment Upper Extremity Assessment Upper Extremity Assessment: Generalized weakness   Lower Extremity Assessment Lower Extremity Assessment: Generalized weakness         Cognition Arousal/Alertness: Awake/alert Behavior During Therapy: WFL for tasks assessed/performed Overall Cognitive Status: History of cognitive impairments - at baseline  Pertinent Vitals/ Pain       Pain Assessment Pain Assessment: Faces Faces Pain Scale: Hurts a little bit Pain Location: R hip Pain Descriptors / Indicators: Discomfort, Dull Pain Intervention(s): Limited activity within patient's tolerance   Frequency  Min 2X/week        Progress Toward Goals  OT Goals(current goals can now be found in the care plan section)  Progress towards OT goals: Progressing toward goals  Acute Rehab OT Goals Patient Stated Goal: to walk OT Goal Formulation: With patient/family Time For Goal Achievement:  12/23/21 Potential to Achieve Goals: Good ADL Goals Pt Will Perform Grooming: with set-up;with supervision;sitting Pt Will Perform Lower Body Dressing: sit to/from stand;with min assist Pt Will Transfer to Toilet: ambulating;regular height toilet;with min guard assist  Plan Discharge plan remains appropriate;Frequency remains appropriate    Co-evaluation                 AM-PAC OT "6 Clicks" Daily Activity     Outcome Measure   Help from another person eating meals?: A Little Help from another person taking care of personal grooming?: A Little Help from another person toileting, which includes using toliet, bedpan, or urinal?: A Lot Help from another person bathing (including washing, rinsing, drying)?: A Lot Help from another person to put on and taking off regular upper body clothing?: A Little Help from another person to put on and taking off regular lower body clothing?: A Lot 6 Click Score: 15    End of Session    OT Visit Diagnosis: Repeated falls (R29.6);Muscle weakness (generalized) (M62.81);Other abnormalities of gait and mobility (R26.89)   Activity Tolerance Patient tolerated treatment well   Patient Left in chair;with call bell/phone within reach;with family/visitor present   Nurse Communication          Time: 2536-6440 OT Time Calculation (min): 16 min  Charges: OT General Charges $OT Visit: 1 Visit OT Treatments $Self Care/Home Management : 8-22 mins  Dessie Coma, M.S. OTR/L  12/10/21, 1:05 PM  ascom [phone]

## 2021-12-10 NOTE — Hospital Course (Addendum)
Madison Brown is a 88 y.o. female with a PMH significant for CAD s/p CABG, CVA, TAVR, hypertension, hyperlipidemia, dementia. They presented from home where she lives with her husband to the ED on 12/07/2021 with fall that resulted in hip fracture but already had a vertebral compression fracture which was due for vertebroplasty 10/31.  10/29: In the ED, it was found that they had normotensive at 122/69 with heart rate of 101.  She was saturating at 93% on room air.  After receiving morphine, patient's oxygen levels decreased to 87% and she was placed on 2 L nasal cannula. No oxygen requirements at baseline. (+)Right femoral neck fracture with substantial varus angulation. Orthopedic surgery was consulted.  10/30: Hip fracture fixation.  12/09/21 -stable, improved. TOC engaged for SNF placement.   Consultants:  Orthopedics   Procedures: 10/30: Hip fracture fixation.       ASSESSMENT & PLAN:   Principal Problem:   Femur fracture, right (HCC) Active Problems:   Delirium   Leukocytosis   CAD (coronary artery disease)   Essential hypertension, benign   Closed right hip fracture, initial encounter [facility])   Fall   History of vertebral fracture   Acute Femur fracture, right ([place]) Orthopedic surgery consulted S/p fixation 10/30 tylenol, Robaxin, Tramadol, Norco for pain control PT/OT recommending SNF - auth pending   Recent L1 vertebral compression fracture-  scheduled for outpatient vertebroplasty 10/31 prior to current injury.  IR will not be doing inpatient and recommended rescheduling outpatient appointment. analgesia PRN   Delirium-  greatly improved  delirium precautions palliative consult TOC engaged for placement as she will unlikely be able to go home in acute setting   CAD- restarting plavix Restart home atorvastatin   Essential hypertension, benign Restart home amlodipine   Body mass index is 16.25 kg/m.     DVT prophylaxis: SCD Pertinent IV  fluids/nutrition: d/c IV fluids  Central lines / invasive devices: none  Code Status: intubation is acceptable, no CPR/defibrillation/ACLS Family Communication: none at thist eim  Disposition: inpatient  TOC needs: SNF Barriers to discharge / significant pending items: pending SNF authorzation

## 2021-12-10 NOTE — Progress Notes (Signed)
Patient had episode of agitation while getting cleaned up after urinating in the bed. Patient was trying to bite and hit staff. Patient was only oriented to self during episode thinking she was at home. Patient also refused to swallow anything po including water saying she just "wants to be left alone". Patient encouraged to eat and drink but refusing at this time.

## 2021-12-10 NOTE — Progress Notes (Signed)
  Subjective: 2 Days Post-Op Procedure(s) (LRB): INTRAMEDULLARY (IM) NAIL INTERTROCHANTERIC (Right) Patient reports pain as mild.  Patient confused this morning and trying to get out of the bed. Patient is well, and has had no acute complaints or problems Plan is to go Skilled nursing facility after hospital stay. Negative for chest pain and shortness of breath Fever: no Gastrointestinal: Negative for nausea and vomiting  Objective: Vital signs in last 24 hours: Temp:  [97.9 F (36.6 C)-98.5 F (36.9 C)] 97.9 F (36.6 C) (11/01 0050) Pulse Rate:  [82-83] 82 (11/01 0050) Resp:  [18] 18 (11/01 0050) BP: (145-152)/(59-68) 145/68 (11/01 0050) SpO2:  [96 %-98 %] 96 % (11/01 0050)  Intake/Output from previous day:  Intake/Output Summary (Last 24 hours) at 12/10/2021 0651 Last data filed at 12/09/2021 1714 Gross per 24 hour  Intake 924.07 ml  Output 350 ml  Net 574.07 ml    Intake/Output this shift: No intake/output data recorded.  Labs: Recent Labs    12/07/21 1838 12/08/21 0349 12/09/21 0433  HGB 10.3* 9.0* 8.6*   Recent Labs    12/08/21 0349 12/09/21 0433  WBC 15.1* 16.2*  RBC 2.97* 2.88*  HCT 27.3* 26.6*  PLT 138* 140*   Recent Labs    12/08/21 0349 12/09/21 0433  NA 135 140  K 3.4* 3.4*  CL 99 102  CO2 29 30  BUN 16 13  CREATININE 1.21* 0.99  GLUCOSE 105* 131*  CALCIUM 7.9* 8.1*   Recent Labs    12/07/21 1838  INR 1.2     EXAM General - Patient is Alert and Confused Extremity - Neurovascular intact Sensation intact distally Dorsiflexion/Plantar flexion intact Compartment soft Dressing/Incision - clean, dry, mild scant proximal drainage Motor Function - intact, moving foot and toes well on exam.   Past Medical History:  Diagnosis Date   Coronary artery disease    Dr. Nehemiah Massed    Assessment/Plan: 2 Days Post-Op Procedure(s) (LRB): INTRAMEDULLARY (IM) NAIL INTERTROCHANTERIC (Right) Principal Problem:   Femur fracture, right  ([place]) Active Problems:   CAD (coronary artery disease)   Essential hypertension, benign   Delirium   Leukocytosis   Closed right hip fracture, initial encounter ([place])   Fall   History of vertebral fracture  Estimated body mass index is 16.25 kg/m as calculated from the following:   Height as of this encounter: 5\' 3"  (1.6 m).   Weight as of this encounter: 41.6 kg. Advance diet Up with therapy  DVT Prophylaxis - Foot Pumps, TED hose, and Plavix Weight-Bearing as tolerated to right leg  Reche Dixon, PA-C Orthopaedic Surgery 12/10/2021, 6:51 AM

## 2021-12-10 NOTE — Plan of Care (Signed)

## 2021-12-10 NOTE — Progress Notes (Signed)
Physical Therapy Treatment Patient Details Name: Madison Brown MRN: [id number] DOB: Mar 12, 1929 Today's Date: 12/10/2021   History of Present Illness Pt is a 88 y.o. female with medical history significant for CAD s/p CABG, CVA, TAVR, hypertension, hyperlipidemia who presents to the ED after ground-level fall.  Of note pt with recent L1 compression fracture with patient scheduled for kyphoplasty prior to fall. MD assessment includes: Right intertrochanteric hip fracture now s/p intramedullary nailing, delirium, and leukocytosis.    PT Comments    Pt was pleasant and motivated to participate during the session and put forth good effort throughout. Pt reported no pain at rest but "a lot" of pain with walking with nursing provided pain medication during session. Pt required grossly less physical assistance with transfers and was able to amb 2 x 40 feet this session with min A for stability and to guide the RW.  Pt reported no adverse symptoms other than R hip pain during the session with SpO2 >/= 91% on room air and HR WNL.  Pt will benefit from PT services in a SNF setting upon discharge to safely address deficits listed in patient problem list for decreased caregiver assistance and eventual return to PLOF.     Recommendations for follow up therapy are one component of a multi-disciplinary discharge planning process, led by the attending physician.  Recommendations may be updated based on patient status, additional functional criteria and insurance authorization.  Follow Up Recommendations  Skilled nursing-short term rehab (<3 hours/day) Can patient physically be transported by private vehicle: Yes   Assistance Recommended at Discharge Frequent or constant Supervision/Assistance  Patient can return home with the following A lot of help with walking and/or transfers;A lot of help with bathing/dressing/bathroom;Assistance with cooking/housework;Direct supervision/assist for medications  management;Assist for transportation;Help with stairs or ramp for entrance   Equipment Recommendations  None recommended by PT    Recommendations for Other Services       Precautions / Restrictions Precautions Precautions: Fall Restrictions Weight Bearing Restrictions: Yes RLE Weight Bearing: Weight bearing as tolerated     Mobility  Bed Mobility               General bed mobility comments: received and left in recliner    Transfers Overall transfer level: Needs assistance Equipment used: Rolling walker (2 wheels) Transfers: Sit to/from Stand Sit to Stand: Min guard           General transfer comment: Mod verbal and tactile cues for hand placement with good concentric control and fair eccentric control    Ambulation/Gait Ambulation/Gait assistance: Min assist Gait Distance (Feet): 40 Feet x 2 Assistive device: Rolling walker (2 wheels) Gait Pattern/deviations: Step-to pattern, Decreased stance time - right, Trunk flexed, Decreased step length - left, Shuffle, Narrow base of support, Step-through pattern Gait velocity: decreased     General Gait Details: Step-to pattern initially that progressed to step-through pattern as walking progressed; heavy trunk flexion with poor carryover of cuing for upright posture, narrow BOS with feet often touching, and slow cadence with min A for stability and to guide the RW   Stairs             Wheelchair Mobility    Modified Rankin (Stroke Patients Only)       Balance Overall balance assessment: Needs assistance, History of Falls Sitting-balance support: No upper extremity supported, Feet supported Sitting balance-Leahy Scale: Fair     Standing balance support: Bilateral upper extremity supported, During functional activity, Reliant on assistive device  for balance Standing balance-Leahy Scale: Poor                              Cognition Arousal/Alertness: Awake/alert Behavior During Therapy:  WFL for tasks assessed/performed Overall Cognitive Status: History of cognitive impairments - at baseline                                          Exercises Total Joint Exercises Ankle Circles/Pumps: AROM, Strengthening, Both, 10 reps Long Arc Quad: AROM, Strengthening, Both, 10 reps Knee Flexion: AROM, Strengthening, Both, 10 reps    General Comments        Pertinent Vitals/Pain Pain Assessment Pain Assessment: PAINAD Breathing: normal Negative Vocalization: occasional moan/groan, low speech, negative/disapproving quality Facial Expression: facial grimacing Body Language: relaxed Consolability: no need to console PAINAD Score: 3 Pain Intervention(s): Monitored during session, Repositioned, RN gave pain meds during session    Home Living                          Prior Function            PT Goals (current goals can now be found in the care plan section) Progress towards PT goals: Progressing toward goals    Frequency    BID      PT Plan Current plan remains appropriate    Co-evaluation              AM-PAC PT "6 Clicks" Mobility   Outcome Measure  Help needed turning from your back to your side while in a flat bed without using bedrails?: A Lot Help needed moving from lying on your back to sitting on the side of a flat bed without using bedrails?: A Lot Help needed moving to and from a bed to a chair (including a wheelchair)?: A Lot Help needed standing up from a chair using your arms (e.g., wheelchair or bedside chair)?: A Little Help needed to walk in hospital room?: A Little Help needed climbing 3-5 steps with a railing? : A Lot 6 Click Score: 14    End of Session Equipment Utilized During Treatment: Gait belt Activity Tolerance: Patient tolerated treatment well Patient left: in chair;with call bell/phone within reach;with chair alarm set;with family/visitor present;with SCD's reapplied Nurse Communication: Mobility  status;Weight bearing status PT Visit Diagnosis: Unsteadiness on feet (R26.81);History of falling (Z91.81);Other abnormalities of gait and mobility (R26.89);Muscle weakness (generalized) (M62.81);Pain Pain - Right/Left: Right Pain - part of body: Hip     Time: 0920-0946 PT Time Calculation (min) (ACUTE ONLY): 26 min  Charges:  $Gait Training: 8-22 mins $Therapeutic Activity: 8-22 mins                     D. Scott Remiel Corti PT, DPT 12/10/21, 11:49 AM

## 2021-12-10 NOTE — TOC Progression Note (Signed)
Transition of Care [facility]) - Progression Note    Patient Details  Name: Madison Brown MRN: [id number] Date of Birth: 01-19-1930  Transition of Care [facility]) CM/SW [place], RN Phone Number: 12/10/2021, 11:30 AM  Clinical Narrative:    Reviewed the bed offers with the family, They chose [facility] ins is pending to go to [facility]                                                 Social Determinants of Health (SDOH) Interventions    Readmission Risk Interventions     No data to display

## 2021-12-10 NOTE — Consult Note (Addendum)
Consultation Note Date: 12/10/2021   Patient Name: Madison Brown  DOB: 1929-05-12  MRN: [id number]  Age / Sex: 88 y.o., female  PCP: Gladstone Lighter, MD Referring Physician: Emeterio Reeve, DO  Reason for Consultation: Establishing goals of care  HPI/Patient Profile: Madison Brown is a 88 y.o. female with a history of dementia and compression fracture found in the ER on 11/30/2021.  She was given a short course of tramadol.  This has since been used up.  Patient was found on the floor today.  Clinical Assessment and Goals of Care: Notes and labs reviewed.  Patient is currently sitting in bedside chair with her daughter, and daughter-in-law present.  Daughter-in-law is a retired Electrical engineer from [employer].  They state patient has been confused, but the confusion is improving.  Patient is married, and has a daughter and a son.  They discussed that 2 years ago patient was still mowing her front yard with a push mower.  Prior to her spine fracture, she was vacuuming and dusting around the house.  They state with regards to cognitive status,  at baseline patient can remember her grandchildren and is able to hold some conversation.  They states she is able to do things physically with direction.  She is continent of bowel and bladder, and has fallen going to the bathroom at night as she will not ask for help and is very quiet and going to the bathroom.   We discussed her diagnosis, prognosis, GOC, EOL wishes disposition and options.  Created space and opportunity for patient  to explore thoughts and feelings regarding current medical information.   A detailed discussion was had today regarding advanced directives.  Concepts specific to code status, artifical feeding and hydration, IV antibiotics and rehospitalization were discussed.  The difference between an aggressive medical intervention path and  a comfort care path was discussed.  Values and goals of care important to patient and family were attempted to be elicited.  Discussed limitations of medical interventions to prolong quality of life in some situations and discussed the concept of human mortality.  They discussed their goals for going to skilled nursing and then having her kyphoplasty when able.  The ladies discussed that she has been complaining of hip pain with walking, but not back pain.  We discussed symptom management and using sedating medications as little as possible.  We discussed a goal of multimodal therapy as able.  They confirm that if patient is in cardiopulmonary arrest that they would not want CPR or other interventions in that scenario.  They advised that patient currently would want all other care up to that point.     SUMMARY OF RECOMMENDATIONS    Family confirms no CPR or other life-saving interventions in cardiopulmonary arrest.  Continue all other care at this time.  Would recommend outpatient palliative.  MAR reviewed.  For post op pain would recommend:  -- Scheduled Tylenol.  -- PRN Tramadol 50mg  for moderate pain and Tramadol 100 mg for severe pain. --  Oxycodone 2.5mg  as needed for breakthrough pain.  -- Agree with Robaxin for muscle spasms, would transition to oral Robaxin. --Would D/C Dilaudid as it has not been required.       Primary Diagnoses: Present on Admission:  CAD (coronary artery disease)  Femur fracture, right (HCC)  Essential hypertension, benign   I have reviewed the medical record, interviewed the patient and family, and examined the patient. The following aspects are pertinent.  Past Medical History:  Diagnosis Date   Coronary artery disease    Dr. Gwen Pounds   Social History   Socioeconomic History   Marital status: Married    Spouse name: Not on file   Number of children: Not on file   Years of education: Not on file   Highest education level: Not on file   Occupational History   Not on file  Tobacco Use   Smoking status: Never   Smokeless tobacco: Never  Substance and Sexual Activity   Alcohol use: No   Drug use: No   Sexual activity: Not on file  Other Topics Concern   Not on file  Social History Narrative   Lives with husband in [place]. No pets.   Work - retired Clinical research associate      Diet - healthy diet   Exercise - walks 2 miles per day   Social Determinants of Corporate investment banker Strain: Not on file  Food Insecurity: Not on file  Transportation Needs: Not on file  Physical Activity: Not on file  Stress: Not on file  Social Connections: Not on file   History reviewed. No pertinent family history. Scheduled Meds:  acetaminophen  1,000 mg Oral Q8H   amLODipine  5 mg Oral Daily   aspirin EC  325 mg Oral Daily   atorvastatin  40 mg Oral Daily   clopidogrel  75 mg Oral Daily   feeding supplement  237 mL Oral BID BM   feeding supplement  237 mL Oral TID BM   multivitamin with minerals  1 tablet Oral Daily   potassium chloride  40 mEq Oral BID   Continuous Infusions:  sodium chloride Stopped (12/09/21 1714)   methocarbamol (ROBAXIN) IV     PRN Meds:.acetaminophen, bisacodyl, HYDROmorphone (DILAUDID) injection, methocarbamol **OR** methocarbamol (ROBAXIN) IV, metoCLOPramide **OR** metoCLOPramide (REGLAN) injection, ondansetron **OR** ondansetron (ZOFRAN) IV, oxyCODONE, sodium phosphate, traMADol Medications Prior to Admission:  Prior to Admission medications   Medication Sig Start Date End Date Taking? Authorizing Provider  amLODipine (NORVASC) 10 MG tablet Take 10 mg by mouth daily.   Yes [provider]  atorvastatin (LIPITOR) 40 MG tablet Take 40 mg by mouth daily.   Yes [provider]  Multiple Vitamins-Minerals (MULTIVITAMIN WITH MINERALS) tablet Take 1 tablet by mouth daily.   Yes [provider]  traMADol (ULTRAM) 50 MG tablet Take 1 tablet (50 mg total) by mouth every 6 (six) hours  as needed. 12/01/21  Yes Minna Antis, MD  clopidogrel (PLAVIX) 75 MG tablet Take 75 mg by mouth daily.    [provider]  feeding supplement (ENSURE ENLIVE / ENSURE PLUS) LIQD Take 237 mLs by mouth 2 (two) times daily between meals. 12/06/20   Delfino Lovett, MD  oxyCODONE (OXY IR/ROXICODONE) 5 MG immediate release tablet Take 0.5-1 tablets (2.5-5 mg total) by mouth every 4 (four) hours as needed for moderate pain (pain score 4-6). 12/09/21   Dedra Skeens, PA-C  traMADol (ULTRAM) 50 MG tablet Take 1 tablet (50 mg total) by mouth every  6 (six) hours as needed for moderate pain. 12/09/21   Dedra Skeens, PA-C   No Known Allergies Review of Systems  All other systems reviewed and are negative.   Physical Exam Pulmonary:     Effort: Pulmonary effort is normal.  Neurological:     Mental Status: She is alert.     Vital Signs: BP (!) 151/69 (BP Location: Left Arm)   Pulse 98   Temp 97.9 F (36.6 C)   Resp 14   Ht 5\' 3"  (1.6 m)   Wt 41.6 kg   SpO2 90%   BMI 16.25 kg/m  Pain Scale: 0-10 POSS *See Group Information*: S-Acceptable,Sleep, easy to arouse Pain Score: 4    SpO2: SpO2: 90 % O2 Device:SpO2: 90 % O2 Flow Rate: .O2 Flow Rate (L/min): 1.5 L/min  IO: Intake/output summary:  Intake/Output Summary (Last 24 hours) at 12/10/2021 1457 Last data filed at 12/09/2021 1714 Gross per 24 hour  Intake 924.07 ml  Output 350 ml  Net 574.07 ml    LBM: Last BM Date : 12/07/21 Baseline Weight: Weight: 41.6 kg Most recent weight: Weight: 41.6 kg       Signed by: 12/09/21, NP   Please contact Palliative Medicine Team phone at [phone] for questions and concerns.  For individual provider: See [phone]

## 2021-12-10 NOTE — Progress Notes (Addendum)
Physical Therapy Treatment Patient Details Name: Madison Brown MRN: [id number] DOB: 1929/04/06 Today's Date: 12/10/2021   History of Present Illness Pt is a 88 y.o. female with medical history significant for CAD s/p CABG, CVA, TAVR, hypertension, hyperlipidemia who presents to the ED after ground-level fall.  Of note pt with recent L1 compression fracture with patient scheduled for kyphoplasty prior to fall. MD assessment includes: Right intertrochanteric hip fracture now s/p intramedullary nailing, delirium, and leukocytosis.    PT Comments    Pt was sitting in recliner with supportive daughter present. Pt is alert but does present with cognition deficits. Per daughter, she is still not at baseline. Author questions if due to pain medications. The patient does remain cooperative and motivated. Was able to ambulate and perform there ex. During ambulation pt's gait becomes more antalgic with increased flex posture. Very narrow shuffling steps. Recliner follow for safety. Once back into room, author issued HEP handout and had pt perform several. Daughter states she will assist pt with the exercises throughout the day. Overall pt tolerated session well but is far from baseline. Recommend DC to SNF to maximize safety and tolerance to ADLs. Acute PT will continue to follow per current POC.    Recommendations for follow up therapy are one component of a multi-disciplinary discharge planning process, led by the attending physician.  Recommendations may be updated based on patient status, additional functional criteria and insurance authorization.  Follow Up Recommendations  Skilled nursing-short term rehab (<3 hours/day) Can patient physically be transported by private vehicle: Yes   Assistance Recommended at Discharge Frequent or constant Supervision/Assistance  Patient can return home with the following A lot of help with walking and/or transfers;A lot of help with  bathing/dressing/bathroom;Assistance with cooking/housework;Direct supervision/assist for medications management;Assist for transportation;Help with stairs or ramp for entrance   Equipment Recommendations  None recommended by PT       Precautions / Restrictions Precautions Precautions: Fall Restrictions Weight Bearing Restrictions: Yes RLE Weight Bearing: Weight bearing as tolerated     Mobility  Bed Mobility Overal bed mobility: Needs Assistance   Transfers Overall transfer level: Needs assistance Equipment used: Rolling walker (2 wheels) Transfers: Sit to/from Stand Sit to Stand: Min assist   General transfer comment: pt diod have one episode of posteior LOB initailly upon standing. Once neutral was found, she was able to stand with CGA only    Ambulation/Gait Ambulation/Gait assistance: Min guard Gait Distance (Feet): 50 Feet Assistive device: Rolling walker (2 wheels) Gait Pattern/deviations: Narrow base of support, Shuffle Gait velocity: decreased     General Gait Details: Pt was able to ambulate ~ 50 ft before having increased antalgic gait pattern. pt was cued for posture correction and    Balance Overall balance assessment: Needs assistance, History of Falls Sitting-balance support: No upper extremity supported, Feet supported Sitting balance-Leahy Scale: Fair     Standing balance support: During functional activity, Bilateral upper extremity supported Standing balance-Leahy Scale: Poor Standing balance comment: pt continue to be high fall risk      Cognition Arousal/Alertness: Awake/alert Behavior During Therapy: WFL for tasks assessed/performed Overall Cognitive Status: History of cognitive impairments - at baseline      General Comments: Pt is awake and cooperative but does present with some cognition impairments.           General Comments General comments (skin integrity, edema, etc.): Issued general supine exercioses HEP. Had pt perform several  however was unable to stay focused enought to fully participate.  Daughter was sitting with pt at conclusion of session."I'll help her do them."      Pertinent Vitals/Pain Pain Assessment Pain Assessment: 0-10 Pain Score: 4  Pain Location: R hip Pain Descriptors / Indicators: Discomfort, Dull Pain Intervention(s): Limited activity within patient's tolerance, Monitored during session, Premedicated before session, Repositioned     PT Goals (current goals can now be found in the care plan section) Acute Rehab PT Goals Patient Stated Goal: Go home Progress towards PT goals: Progressing toward goals    Frequency    BID      PT Plan Current plan remains appropriate       AM-PAC PT "6 Clicks" Mobility   Outcome Measure  Help needed turning from your back to your side while in a flat bed without using bedrails?: A Lot Help needed moving from lying on your back to sitting on the side of a flat bed without using bedrails?: A Lot Help needed moving to and from a bed to a chair (including a wheelchair)?: A Lot Help needed standing up from a chair using your arms (e.g., wheelchair or bedside chair)?: A Little Help needed to walk in hospital room?: A Little Help needed climbing 3-5 steps with a railing? : A Lot 6 Click Score: 14    End of Session Equipment Utilized During Treatment: Gait belt Activity Tolerance: Patient tolerated treatment well Patient left: in chair;with call bell/phone within reach;with chair alarm set;with family/visitor present;with SCD's reapplied Nurse Communication: Mobility status;Weight bearing status PT Visit Diagnosis: Unsteadiness on feet (R26.81);History of falling (Z91.81);Other abnormalities of gait and mobility (R26.89);Muscle weakness (generalized) (M62.81);Pain Pain - Right/Left: Right Pain - part of body: Hip     Time: 5885-0277 PT Time Calculation (min) (ACUTE ONLY): 19 min  Charges:  $Gait Training: 8-22 mins $Therapeutic Activity: 8-22  mins                     Julaine Fusi PTA 12/10/21, 3:41 PM

## 2021-12-10 NOTE — Care Management Important Message (Signed)
Important Message  Patient Details  Name: Madison Brown MRN: [id number] Date of Birth: [birth year]   Medicare Important Message Given:  N/A - LOS <3 / Initial given by admissions     [name] [name] 12/10/2021, 8:52 AM

## 2021-12-11 DIAGNOSIS — D72829 Elevated white blood cell count, unspecified: Secondary | ICD-10-CM | POA: Diagnosis not present

## 2021-12-11 DIAGNOSIS — Z951 Presence of aortocoronary bypass graft: Secondary | ICD-10-CM | POA: Diagnosis not present

## 2021-12-11 DIAGNOSIS — F0390 Unspecified dementia without behavioral disturbance: Secondary | ICD-10-CM | POA: Diagnosis not present

## 2021-12-11 DIAGNOSIS — Z7982 Long term (current) use of aspirin: Secondary | ICD-10-CM | POA: Diagnosis not present

## 2021-12-11 DIAGNOSIS — W19XXXD Unspecified fall, subsequent encounter: Secondary | ICD-10-CM | POA: Diagnosis not present

## 2021-12-11 DIAGNOSIS — K5904 Chronic idiopathic constipation: Secondary | ICD-10-CM | POA: Diagnosis not present

## 2021-12-11 DIAGNOSIS — M62838 Other muscle spasm: Secondary | ICD-10-CM | POA: Diagnosis not present

## 2021-12-11 DIAGNOSIS — W19XXXA Unspecified fall, initial encounter: Secondary | ICD-10-CM

## 2021-12-11 DIAGNOSIS — Z953 Presence of xenogenic heart valve: Secondary | ICD-10-CM | POA: Diagnosis not present

## 2021-12-11 DIAGNOSIS — S32010D Wedge compression fracture of first lumbar vertebra, subsequent encounter for fracture with routine healing: Secondary | ICD-10-CM | POA: Diagnosis not present

## 2021-12-11 DIAGNOSIS — K5900 Constipation, unspecified: Secondary | ICD-10-CM | POA: Diagnosis not present

## 2021-12-11 DIAGNOSIS — R410 Disorientation, unspecified: Secondary | ICD-10-CM | POA: Diagnosis not present

## 2021-12-11 DIAGNOSIS — S72141D Displaced intertrochanteric fracture of right femur, subsequent encounter for closed fracture with routine healing: Secondary | ICD-10-CM | POA: Diagnosis not present

## 2021-12-11 DIAGNOSIS — S72001A Fracture of unspecified part of neck of right femur, initial encounter for closed fracture: Secondary | ICD-10-CM

## 2021-12-11 DIAGNOSIS — E7800 Pure hypercholesterolemia, unspecified: Secondary | ICD-10-CM | POA: Diagnosis not present

## 2021-12-11 DIAGNOSIS — Z8781 Personal history of (healed) traumatic fracture: Secondary | ICD-10-CM

## 2021-12-11 DIAGNOSIS — I2510 Atherosclerotic heart disease of native coronary artery without angina pectoris: Secondary | ICD-10-CM | POA: Diagnosis not present

## 2021-12-11 DIAGNOSIS — S72001D Fracture of unspecified part of neck of right femur, subsequent encounter for closed fracture with routine healing: Secondary | ICD-10-CM | POA: Diagnosis not present

## 2021-12-11 DIAGNOSIS — M25551 Pain in right hip: Secondary | ICD-10-CM | POA: Diagnosis not present

## 2021-12-11 DIAGNOSIS — Z8673 Personal history of transient ischemic attack (TIA), and cerebral infarction without residual deficits: Secondary | ICD-10-CM | POA: Diagnosis not present

## 2021-12-11 DIAGNOSIS — R4182 Altered mental status, unspecified: Secondary | ICD-10-CM | POA: Diagnosis not present

## 2021-12-11 DIAGNOSIS — E43 Unspecified severe protein-calorie malnutrition: Secondary | ICD-10-CM | POA: Diagnosis not present

## 2021-12-11 DIAGNOSIS — I10 Essential (primary) hypertension: Secondary | ICD-10-CM | POA: Diagnosis not present

## 2021-12-11 LAB — BASIC METABOLIC PANEL
Anion gap: 5 (ref 5–15)
BUN: 16 mg/dL (ref 8–23)
CO2: 33 mmol/L — ABNORMAL HIGH (ref 22–32)
Calcium: 8.3 mg/dL — ABNORMAL LOW (ref 8.9–10.3)
Chloride: 106 mmol/L (ref 98–111)
Creatinine, Ser: 1.09 mg/dL — ABNORMAL HIGH (ref 0.44–1.00)
GFR, Estimated: 48 mL/min — ABNORMAL LOW (ref >60)
Glucose, Bld: 113 mg/dL — ABNORMAL HIGH (ref 70–99)
Potassium: 3.3 mmol/L — ABNORMAL LOW (ref 3.5–5.1)
Sodium: 144 mmol/L (ref 135–145)

## 2021-12-11 LAB — CBC
HCT: 27.9 % — ABNORMAL LOW (ref 36.0–46.0)
Hemoglobin: 9 g/dL — ABNORMAL LOW (ref 12.0–15.0)
MCH: 29.6 pg (ref 26.0–34.0)
MCHC: 32.3 g/dL (ref 30.0–36.0)
MCV: 91.8 fL (ref 80.0–100.0)
Platelets: 187 10*3/uL (ref 150–400)
RBC: 3.04 MIL/uL — ABNORMAL LOW (ref 3.87–5.11)
RDW: 13.1 % (ref 11.5–15.5)
WBC: 11.3 10*3/uL — ABNORMAL HIGH (ref 4.0–10.5)
nRBC: 0 % (ref 0.0–0.2)

## 2021-12-11 MED ORDER — FLEET ENEMA 7-19 GM/118ML RE ENEM: 1.0000 | ENEMA | Freq: Once | RECTAL | 0 refills | Status: DC | PRN

## 2021-12-11 MED ORDER — ASPIRIN 325 MG PO TBEC: 325.0000 mg | DELAYED_RELEASE_TABLET | Freq: Every day | ORAL | Status: DC

## 2021-12-11 MED ORDER — METHOCARBAMOL 500 MG PO TABS: 500.0000 mg | ORAL_TABLET | Freq: Four times a day (QID) | ORAL | 0 refills | Status: DC | PRN

## 2021-12-11 MED ORDER — POTASSIUM CHLORIDE CRYS ER 20 MEQ PO TBCR
40.0000 meq | EXTENDED_RELEASE_TABLET | Freq: Once | ORAL | Status: AC
  Administered 2021-12-11: 40 meq via ORAL
  Filled 2021-12-11: qty 2

## 2021-12-11 MED ORDER — ACETAMINOPHEN 500 MG PO TABS: 1000.0000 mg | ORAL_TABLET | Freq: Three times a day (TID) | ORAL | 0 refills | Status: AC

## 2021-12-11 MED ORDER — AMLODIPINE BESYLATE 5 MG PO TABS: 5.0000 mg | ORAL_TABLET | Freq: Every day | ORAL | 0 refills | Status: DC

## 2021-12-11 NOTE — Discharge Summary (Signed)
Physician Discharge Summary   Patient: Madison Brown MRN: [id number]  DOB: 04/23/1929   Admit:     Date of Admission: 12/07/2021 Admitted from: home   Discharge: Date of discharge: 12/11/21 Disposition: Skilled nursing facility Condition at discharge: fair  CODE STATUS: DO NOT PERFORM CPR/ACLS/DEFIBRILLATION, INTUBATION IS ACCEPTABLE IF RESPIRATORY FAILURE      Discharge Physician: Sunnie NielsenNatalie Winslow Verrill, DO [facility]     PCP: Enid BaasKalisetti, Radhika, MD  Recommendations for Outpatient Follow-up:  Follow up with PCP Enid BaasKalisetti, Radhika, MD in 2-4 weeks\ Follow w/ ortho in 2 weeks  Please obtain labs/tests: BMP in 2-3 days monitor potassium a Please follow up on the following pending results: none PCP AND OTHER OUTPATIENT PROVIDERS: SEE BELOW FOR SPECIFIC DISCHARGE INSTRUCTIONS PRINTED FOR PATIENT IN ADDITION TO GENERIC AVS PATIENT INFO    Discharge Instructions     Diet - low sodium heart healthy   Complete by: As directed    Discharge instructions   Complete by: As directed    Ortho [facility] in 2 weeks for staple removal and x-rays of the right hip.   Discharge wound care:   Complete by: As directed    Reinforce/clean dressing prn   Increase activity slowly   Complete by: As directed          Discharge Diagnoses: Principal Problem:   Femur fracture, right (HCC) Active Problems:   Delirium   Leukocytosis   CAD (coronary artery disease)   Essential hypertension, benign   Closed right hip fracture, initial encounter (HCC)   Fall   History of vertebral fracture       Hospital Course: Madison Brown is a 88 y.o. female with a PMH significant for CAD s/p CABG, CVA, TAVR, hypertension, hyperlipidemia, dementia. They presented from home where she lives with her husband to the ED on 12/07/2021 with fall that resulted in hip fracture but already had a vertebral compression fracture which was due for vertebroplasty 10/31.   10/29: In the ED, it was found that they had normotensive at 122/69 with heart rate of 101.  She was saturating at 93% on room air.  After receiving morphine, patient's oxygen levels decreased to 87% and she was placed on 2 L nasal cannula. No oxygen requirements at baseline. (+)Right femoral neck fracture with substantial varus angulation. Orthopedic surgery was consulted.  10/30: Hip fracture fixation.  10/31-11/02 -stable, improved. TOC engaged for SNF placement.   Consultants:  Orthopedics   Procedures: 10/30: Hip fracture fixation.       ASSESSMENT & PLAN:   Principal Problem:   Femur fracture, right (HCC) Active Problems:   Delirium   Leukocytosis   CAD (coronary artery disease)   Essential hypertension, benign   Closed right hip fracture, initial encounter [facility](HCC)   Fall   History of vertebral fracture   Acute Femur fracture, right [facility](HCC) Orthopedic surgery consulted S/p fixation 10/30 tylenol, Robaxin, Tramadol, Norco for pain control PT/OT recommending SNF - auth pending  DVT Prophylaxis per ortho - Foot Pumps, TED hose, and Plavix Weight-Bearing as tolerated to right leg Discharge planning.  [facility] 2 weeks for staple removal and x-rays of the right hip.  Recent L1 vertebral compression fracture-  scheduled for outpatient vertebroplasty 10/31 prior to current injury.  IR will not be doing inpatient and recommended rescheduling outpatient appointment. analgesia PRN   Delirium-  greatly improved  delirium precautions palliative consult TOC engaged for  placement as she will unlikely be able to go home in acute setting   CAD- restarting plavix Restart home atorvastatin   Essential hypertension, benign Restart home amlodipine   Body mass index is 16.25 kg/m.     DVT prophylaxis: ASA, TED hose, and Plavix until d/c by ortho   Central lines / invasive devices: none  Code Status: intubation is acceptable, no  CPR/defibrillation/ACLS Family Communication: family was at bedside on rounds, all questions answered   Disposition: SNF          Discharge Instructions  Allergies as of 12/11/2021   No Known Allergies      Medication List     TAKE these medications    acetaminophen 500 MG tablet Commonly known as: TYLENOL Take 2 tablets (1,000 mg total) by mouth every 8 (eight) hours.   amLODipine 5 MG tablet Commonly known as: NORVASC Take 1 tablet (5 mg total) by mouth daily. Start taking on: December 12, 2021 What changed:  medication strength how much to take   aspirin EC 325 MG tablet Take 1 tablet (325 mg total) by mouth daily. Start taking on: December 12, 2021   atorvastatin 40 MG tablet Commonly known as: LIPITOR Take 40 mg by mouth daily.   clopidogrel 75 MG tablet Commonly known as: PLAVIX Take 75 mg by mouth daily.   feeding supplement Liqd Take 237 mLs by mouth 2 (two) times daily between meals.   methocarbamol 500 MG tablet Commonly known as: ROBAXIN Take 1 tablet (500 mg total) by mouth every 6 (six) hours as needed for muscle spasms.   multivitamin with minerals tablet Take 1 tablet by mouth daily.   oxyCODONE 5 MG immediate release tablet Commonly known as: Oxy IR/ROXICODONE Take 0.5-1 tablets (2.5-5 mg total) by mouth every 4 (four) hours as needed for moderate pain (pain score 4-6).   sodium phosphate 7-19 GM/118ML Enem Place 133 mLs (1 enema total) rectally once as needed for severe constipation.   traMADol 50 MG tablet Commonly known as: ULTRAM Take 1 tablet (50 mg total) by mouth every 6 (six) hours as needed for moderate pain. What changed: reasons to take this               Discharge Care Instructions  (From admission, onward)           Start     Ordered   12/11/21 0000  Discharge wound care:       Comments: Reinforce/clean dressing prn   12/11/21 1023             Contact information for follow-up providers      Dedra Skeens, PA-C Follow up in 2 week(s).   Specialty: Orthopedic Surgery Why: For x-rays and staple removal Contact information: [address] [place] [address] [phone]              Contact information for after-discharge care     Destination     [facility] Preferred SNF .   Service: Skilled Nursing Contact information: [address] [place] [address] [phone]                     No Known Allergies   Subjective: pt feels okay this morning. Had some agitation overnight but she doesn't recall this well. Pain is controlled    Discharge Exam: BP (!) 143/67 (BP Location: Left Arm)   Pulse 83  Temp 99.1 F (37.3 C)   Resp 18   Ht 5\' 3"  (1.6 m)   Wt 41.6 kg   SpO2 99%   BMI 16.25 kg/m  General: Pt is alert, awake, not in acute distress Cardiovascular: RRR, S1/S2 +, no rubs, no gallops Respiratory: CTA bilaterally, no wheezing, no rhonchi Abdominal: Soft, NT, ND, bowel sounds + Extremities: no edema, no cyanosis     The results of significant diagnostics from this hospitalization (including imaging, microbiology, ancillary and laboratory) are listed below for reference.     Microbiology: Recent Results (from the past 240 hour(s))  Culture, blood (Routine X 2) w Reflex to ID Panel     Status: None (Preliminary result)   Collection Time: 12/08/21 12:15 AM   Specimen: BLOOD  Result Value Ref Range Status   Specimen Description BLOOD BLOOD RIGHT ARM  Final   Special Requests   Final    BOTTLES DRAWN AEROBIC AND ANAEROBIC Blood Culture adequate volume   Culture   Final    NO GROWTH 2 DAYS Performed at [facility], [address], [place], [place]    Report Status PENDING  Incomplete  Culture, blood (Routine X 2) w Reflex to ID Panel     Status: None (Preliminary result)   Collection Time: 12/08/21 12:15 AM    Specimen: BLOOD  Result Value Ref Range Status   Specimen Description BLOOD BLOOD RIGHT HAND  Final   Special Requests   Final    BOTTLES DRAWN AEROBIC AND ANAEROBIC Blood Culture adequate volume   Culture   Final    NO GROWTH 2 DAYS Performed at [facility], [address] [place], [place]    Report Status PENDING  Incomplete  MRSA Next Gen by PCR, Nasal     Status: None   Collection Time: 12/08/21  1:55 AM   Specimen: Nasal Mucosa; Nasal Swab  Result Value Ref Range Status   MRSA by PCR Next Gen NOT DETECTED NOT DETECTED Final    Comment: (NOTE) The GeneXpert MRSA Assay (FDA approved for NASAL specimens only), is one component of a comprehensive MRSA colonization surveillance program. It is not intended to diagnose MRSA infection nor to guide or monitor treatment for MRSA infections. Test performance is not FDA approved in patients less than 66 years old. Performed at [facility], [address] [place], [place]      Labs: BNP (last 3 results) No results for input(s): "BNP" in the last 8760 hours. Basic Metabolic Panel: Recent Labs  Lab 12/07/21 1838 12/08/21 0349 12/09/21 0433 12/10/21 0637 12/11/21 0354  NA 134* 135 140 140 144  K 3.4* 3.4* 3.4* 2.9* 3.3*  CL 97* 99 102 105 106  CO2 28 29 30 30  33*  GLUCOSE 138* 105* 131* 121* 113*  BUN 18 16 13 18 16   CREATININE 1.22* 1.21* 0.99 1.03* 1.09*  CALCIUM 8.3* 7.9* 8.1* 7.9* 8.3*   Liver Function Tests: Recent Labs  Lab 12/07/21 1838  AST 30  ALT 27  ALKPHOS 122  BILITOT 1.0  PROT 6.4*  ALBUMIN 3.2*   No results for input(s): "LIPASE", "AMYLASE" in the last 168 hours. No results for input(s): "AMMONIA" in the last 168 hours. CBC: Recent Labs  Lab 12/07/21 1838 12/08/21 0349 12/09/21 0433 12/10/21 0637 12/11/21 0354  WBC 18.1* 15.1* 16.2* 15.3* 11.3*  NEUTROABS 15.6* 12.3*  --   --   --   HGB 10.3* 9.0* 8.6* 8.4* 9.0*  HCT 30.4* 27.3* 26.6* 25.8* 27.9*   MCV 88.1 91.9 92.4 91.8 91.8  PLT 156 138* 140* 171 187   Cardiac Enzymes: No results for input(s): "CKTOTAL", "CKMB", "CKMBINDEX", "TROPONINI" in the last 168 hours. BNP: Invalid input(s): "POCBNP" CBG: No results for input(s): "GLUCAP" in the last 168 hours. D-Dimer No results for input(s): "DDIMER" in the last 72 hours. Hgb A1c No results for input(s): "HGBA1C" in the last 72 hours. Lipid Profile No results for input(s): "CHOL", "HDL", "LDLCALC", "TRIG", "CHOLHDL", "LDLDIRECT" in the last 72 hours. Thyroid function studies No results for input(s): "TSH", "T4TOTAL", "T3FREE", "THYROIDAB" in the last 72 hours.  Invalid input(s): "FREET3" Anemia work up No results for input(s): "VITAMINB12", "FOLATE", "FERRITIN", "TIBC", "IRON", "RETICCTPCT" in the last 72 hours. Urinalysis    Component Value Date/Time   COLORURINE YELLOW (A) 12/07/2021 2359   APPEARANCEUR CLEAR (A) 12/07/2021 2359   LABSPEC 1.019 12/07/2021 2359   PHURINE 6.0 12/07/2021 2359   GLUCOSEU NEGATIVE 12/07/2021 2359   GLUCOSEU NEGATIVE 08/14/2016 1029   HGBUR NEGATIVE 12/07/2021 2359   BILIRUBINUR NEGATIVE 12/07/2021 2359   BILIRUBINUR neg 12/31/2011 1501   KETONESUR NEGATIVE 12/07/2021 2359   PROTEINUR 30 (A) 12/07/2021 2359   UROBILINOGEN 0.2 08/14/2016 1029   NITRITE NEGATIVE 12/07/2021 2359   LEUKOCYTESUR NEGATIVE 12/07/2021 2359   Sepsis Labs Recent Labs  Lab 12/08/21 0349 12/09/21 0433 12/10/21 0637 12/11/21 0354  WBC 15.1* 16.2* 15.3* 11.3*   Microbiology Recent Results (from the past 240 hour(s))  Culture, blood (Routine X 2) w Reflex to ID Panel     Status: None (Preliminary result)   Collection Time: 12/08/21 12:15 AM   Specimen: BLOOD  Result Value Ref Range Status   Specimen Description BLOOD BLOOD RIGHT ARM  Final   Special Requests   Final    BOTTLES DRAWN AEROBIC AND ANAEROBIC Blood Culture adequate volume   Culture   Final    NO GROWTH 2 DAYS Performed at [facility],  [address], [place], [place] [address]    Report Status PENDING  Incomplete  Culture, blood (Routine X 2) w Reflex to ID Panel     Status: None (Preliminary result)   Collection Time: 12/08/21 12:15 AM   Specimen: BLOOD  Result Value Ref Range Status   Specimen Description BLOOD BLOOD RIGHT HAND  Final   Special Requests   Final    BOTTLES DRAWN AEROBIC AND ANAEROBIC Blood Culture adequate volume   Culture   Final    NO GROWTH 2 DAYS Performed at [facility], [address] [place], [place] [address]    Report Status PENDING  Incomplete  MRSA Next Gen by PCR, Nasal     Status: None   Collection Time: 12/08/21  1:55 AM   Specimen: Nasal Mucosa; Nasal Swab  Result Value Ref Range Status   MRSA by PCR Next Gen NOT DETECTED NOT DETECTED Final    Comment: (NOTE) The GeneXpert MRSA Assay (FDA approved for NASAL specimens only), is one component of a comprehensive MRSA colonization surveillance program. It is not intended to diagnose MRSA infection nor to guide or monitor treatment for MRSA infections. Test performance is not FDA approved in patients less than 72 years old. Performed at [facility], [address], [place], [place] [address]    Imaging DG C-Arm 1-60 Min-No Report  Result Date: 12/08/2021 CLINICAL DATA:  Surgical internal fixation right femur fracture. EXAM: DG HIP (WITH OR WITHOUT PELVIS) 2-3V RIGHT; DG C-ARM  1-60 MIN-NO REPORT COMPARISON:  December 07, 2021. FINDINGS: Six intraoperative fluoroscopic images were obtained of the right hip. These demonstrate the surgical internal fixation of proximal right femoral neck fracture. IMPRESSION: Fluoroscopic guidance provided during the surgical internal fixation of proximal right femoral neck fracture. Electronically Signed   By: Lupita Raider M.D.   On: 12/08/2021 15:57   DG HIP UNILAT WITH PELVIS 2-3 VIEWS RIGHT  Result Date: 12/08/2021 CLINICAL DATA:  Surgical internal fixation right femur  fracture. EXAM: DG HIP (WITH OR WITHOUT PELVIS) 2-3V RIGHT; DG C-ARM 1-60 MIN-NO REPORT COMPARISON:  December 07, 2021. FINDINGS: Six intraoperative fluoroscopic images were obtained of the right hip. These demonstrate the surgical internal fixation of proximal right femoral neck fracture. IMPRESSION: Fluoroscopic guidance provided during the surgical internal fixation of proximal right femoral neck fracture. Electronically Signed   By: Lupita Raider M.D.   On: 12/08/2021 15:57   DG Chest 1 View  Result Date: 12/07/2021 CLINICAL DATA:  Unwitnessed fall.  Dementia. EXAM: CHEST  1 VIEW COMPARISON:  11/25/2020 FINDINGS: Prior CABG. Atherosclerotic calcification of the aortic arch. Upper normal heart size. The lungs appear clear.  Bony demineralization is present. IMPRESSION: 1. No active cardiopulmonary disease is radiographically apparent. 2. Prior CABG. 3. Bony demineralization. Electronically Signed   By: Gaylyn Rong M.D.   On: 12/07/2021 15:12   DG Hip Unilat With Pelvis 2-3 Views Left  Result Date: 12/07/2021 CLINICAL DATA:  Unwitnessed fall, hip pain, dementia. EXAM: DG HIP (WITH OR WITHOUT PELVIS) 2-3V LEFT COMPARISON:  CT pelvis 12/01/2021 FINDINGS: Bony demineralization. No left hip fracture is identified. Atherosclerosis noted. Patient has a known right basicervical femoral assessed with dedicated right hip imaging. IMPRESSION: 1. No LEFT hip fracture identified, the patient has a known right basicervical femoral neck fracture. 2. Bony demineralization. 3. Atherosclerosis. Electronically Signed   By: Gaylyn Rong M.D.   On: 12/07/2021 15:10   DG Lumbar Spine Complete  Result Date: 12/07/2021 CLINICAL DATA:  Fall, right hip pain. Known L1 compression fracture. EXAM: LUMBAR SPINE - COMPLETE 4+ VIEW COMPARISON:  Lumbar CT 12/01/2021 FINDINGS: Bony demineralization. Bilateral degenerative facet arthropathy at L4-5 at L5-S1. Stable 60% compression fracture at L1 with some middle column  involvement based on the mild posterior bony retropulsion which, like love before, amounts to about 0.4 cm. Stable grade 1 degenerative anterolisthesis L4-5. Stable loss of intervertebral disc height at L4-5 and L5-S1. No new fracture is readily apparent. Atherosclerosis is present, including aortoiliac atherosclerotic disease. IMPRESSION: 1. Stable 60% compression fracture at L1 with some middle column involvement. 2. Stable grade 1 degenerative anterolisthesis at L4-5. 3. Bony demineralization. 4. Aortoiliac atherosclerotic disease. Electronically Signed   By: Gaylyn Rong M.D.   On: 12/07/2021 15:08   DG Hip Unilat  With Pelvis 2-3 Views Right  Result Date: 12/07/2021 CLINICAL DATA:  Unwitnessed fall.  Dementia.  Right hip pain. EXAM: DG HIP (WITH OR WITHOUT PELVIS) 2-3V RIGHT COMPARISON:  CT pelvis 12/01/2021 FINDINGS: New basicervical right femoral neck fracture with substantial varus angulation. Questionable extension into the greater trochanter. Bony demineralization. Atherosclerotic vascular calcifications. IMPRESSION: 1. New basicervical right femoral neck fracture with substantial varus angulation. Questionable extension into the greater trochanter. 2. Bony demineralization. Electronically Signed   By: Gaylyn Rong M.D.   On: 12/07/2021 15:06   CT Head Wo Contrast  Result Date: 12/07/2021 CLINICAL DATA:  Altered mental status.  Trauma.  Fall. EXAM: CT HEAD WITHOUT CONTRAST CT CERVICAL SPINE WITHOUT CONTRAST TECHNIQUE:  Multidetector CT imaging of the head and cervical spine was performed following the standard protocol without intravenous contrast. Multiplanar CT image reconstructions of the cervical spine were also generated. RADIATION DOSE REDUCTION: This exam was performed according to the departmental dose-optimization program which includes automated exposure control, adjustment of the mA and/or kV according to patient size and/or use of iterative reconstruction technique.  COMPARISON:  CT scan of the brain November 25, 2020. FINDINGS: CT HEAD FINDINGS Brain: No subdural, epidural, or subarachnoid hemorrhage. Multiple small cerebellar infarcts, unchanged. Cerebellum, brainstem, and basal cisterns otherwise normal. Remote bilateral occipital infarcts. White matter changes. Prominence of ventricles and sulci, stable. No acute cortical ischemia or infarct identified on today's study. No mass effect or midline shift. Vascular: No hyperdense vessel or unexpected calcification. Skull: Normal. Negative for fracture or focal lesion. Sinuses/Orbits: No acute finding. Other: None. CT CERVICAL SPINE FINDINGS Alignment: Trace retrolisthesis of C3 versus C2 and C4. Trace anterolisthesis of C7 versus T1. Trace anterolisthesis of T1 versus T2. No other malalignment. Skull base and vertebrae: No acute fracture. No primary bone lesion or focal pathologic process. Soft tissues and spinal canal: No prevertebral fluid or swelling. No visible canal hematoma. Disc levels: Multilevel degenerative disc disease and facet degenerative changes. Upper chest: Negative. Other: No other abnormalities. IMPRESSION: 1. No acute intracranial abnormalities. 2. No fracture or traumatic malalignment in the cervical spine. 3. Multilevel degenerative changes. Electronically Signed   By: Gerome Sam III M.D.   On: 12/07/2021 14:41   CT Cervical Spine Wo Contrast  Result Date: 12/07/2021 CLINICAL DATA:  Altered mental status.  Trauma.  Fall. EXAM: CT HEAD WITHOUT CONTRAST CT CERVICAL SPINE WITHOUT CONTRAST TECHNIQUE: Multidetector CT imaging of the head and cervical spine was performed following the standard protocol without intravenous contrast. Multiplanar CT image reconstructions of the cervical spine were also generated. RADIATION DOSE REDUCTION: This exam was performed according to the departmental dose-optimization program which includes automated exposure control, adjustment of the mA and/or kV according to  patient size and/or use of iterative reconstruction technique. COMPARISON:  CT scan of the brain November 25, 2020. FINDINGS: CT HEAD FINDINGS Brain: No subdural, epidural, or subarachnoid hemorrhage. Multiple small cerebellar infarcts, unchanged. Cerebellum, brainstem, and basal cisterns otherwise normal. Remote bilateral occipital infarcts. White matter changes. Prominence of ventricles and sulci, stable. No acute cortical ischemia or infarct identified on today's study. No mass effect or midline shift. Vascular: No hyperdense vessel or unexpected calcification. Skull: Normal. Negative for fracture or focal lesion. Sinuses/Orbits: No acute finding. Other: None. CT CERVICAL SPINE FINDINGS Alignment: Trace retrolisthesis of C3 versus C2 and C4. Trace anterolisthesis of C7 versus T1. Trace anterolisthesis of T1 versus T2. No other malalignment. Skull base and vertebrae: No acute fracture. No primary bone lesion or focal pathologic process. Soft tissues and spinal canal: No prevertebral fluid or swelling. No visible canal hematoma. Disc levels: Multilevel degenerative disc disease and facet degenerative changes. Upper chest: Negative. Other: No other abnormalities. IMPRESSION: 1. No acute intracranial abnormalities. 2. No fracture or traumatic malalignment in the cervical spine. 3. Multilevel degenerative changes. Electronically Signed   By: Gerome Sam III M.D.   On: 12/07/2021 14:41      Time coordinating discharge: over 30 minutes  SIGNED:  Sunnie Nielsen DO [facility]

## 2021-12-11 NOTE — Plan of Care (Signed)

## 2021-12-11 NOTE — TOC Progression Note (Signed)
Transition of Care [facility]) - Progression Note    Patient Details  Name: Madison Brown MRN: [id number] Date of Birth: 1929/06/25  Transition of Care [facility]) CM/SW [place], RN Phone Number: 12/11/2021, 11:55 AM  Clinical Narrative:    Called EMS to transport to [facility] Family is in the room and aware She is 3rd on list         Expected Discharge Plan and Services           Expected Discharge Date: 12/11/21                                     Social Determinants of Health (SDOH) Interventions    Readmission Risk Interventions     No data to display

## 2021-12-11 NOTE — Progress Notes (Signed)
Report called to [facility]. Discharge pending EMS transport.

## 2021-12-11 NOTE — Progress Notes (Signed)
  Subjective: 3 Days Post-Op Procedure(s) (LRB): INTRAMEDULLARY (IM) NAIL INTERTROCHANTERIC (Right) Patient reports pain as mild.  Less confused today. Patient is well, and has had no acute complaints or problems Plan is to go Skilled nursing facility after hospital stay. Negative for chest pain and shortness of breath Fever: no Gastrointestinal: Negative for nausea and vomiting  Objective: Vital signs in last 24 hours: Temp:  [97.9 F (36.6 C)-98.4 F (36.9 C)] 98.4 F (36.9 C) (11/02 0012) Pulse Rate:  [97-98] 97 (11/02 0012) Resp:  [14-16] 16 (11/02 0012) BP: (151-155)/(69) 155/69 (11/02 0012) SpO2:  [90 %-100 %] 100 % (11/02 0012)  Intake/Output from previous day: No intake or output data in the 24 hours ending 12/11/21 0722   Intake/Output this shift: No intake/output data recorded.  Labs: Recent Labs    12/09/21 0433 12/10/21 0637 12/11/21 0354  HGB 8.6* 8.4* 9.0*   Recent Labs    12/10/21 0637 12/11/21 0354  WBC 15.3* 11.3*  RBC 2.81* 3.04*  HCT 25.8* 27.9*  PLT 171 187   Recent Labs    12/10/21 0637 12/11/21 0354  NA 140 144  K 2.9* 3.3*  CL 105 106  CO2 30 33*  BUN 18 16  CREATININE 1.03* 1.09*  GLUCOSE 121* 113*  CALCIUM 7.9* 8.3*   No results for input(s): "LABPT", "INR" in the last 72 hours.    EXAM General - Patient is Alert and Confused Extremity - Neurovascular intact Sensation intact distally Dorsiflexion/Plantar flexion intact Compartment soft Dressing/Incision - clean, dry, mild scant proximal drainage Motor Function - intact, moving foot and toes well on exam.  Ambulated 50 feet with physical therapy.  Past Medical History:  Diagnosis Date   Coronary artery disease    Dr. Nehemiah Massed    Assessment/Plan: 3 Days Post-Op Procedure(s) (LRB): INTRAMEDULLARY (IM) NAIL INTERTROCHANTERIC (Right) Principal Problem:   Femur fracture, right ([place]) Active Problems:   CAD (coronary artery disease)   Essential hypertension, benign    Delirium   Leukocytosis   Closed right hip fracture, initial encounter ([place])   Fall   History of vertebral fracture  Estimated body mass index is 16.25 kg/m as calculated from the following:   Height as of this encounter: 5\' 3"  (1.6 m).   Weight as of this encounter: 41.6 kg. Advance diet Up with therapy  DVT Prophylaxis - Foot Pumps, TED hose, and Plavix Weight-Bearing as tolerated to right leg  Discharge planning.  Plan to follow-up at [facility] clinic orthopedics in 2 weeks for staple removal and x-rays of the right hip.  Reche Dixon, PA-C Orthopaedic Surgery 12/11/2021, 7:22 AM

## 2021-12-11 NOTE — Plan of Care (Signed)
  Problem: Education: Goal: Knowledge of General Education information will improve Description: Including pain rating scale, medication(s)/side effects and non-pharmacologic comfort measures 12/11/2021 1024 by Alferd Apa, RN Outcome: Adequate for Discharge 12/11/2021 0739 by Alferd Apa, RN Outcome: Progressing   Problem: Health Behavior/Discharge Planning: Goal: Ability to manage health-related needs will improve 12/11/2021 1024 by Alferd Apa, RN Outcome: Adequate for Discharge 12/11/2021 0739 by Alferd Apa, RN Outcome: Progressing   Problem: Clinical Measurements: Goal: Ability to maintain clinical measurements within normal limits will improve 12/11/2021 1024 by Alferd Apa, RN Outcome: Adequate for Discharge 12/11/2021 0739 by Alferd Apa, RN Outcome: Progressing Goal: Will remain free from infection 12/11/2021 1024 by Alferd Apa, RN Outcome: Adequate for Discharge 12/11/2021 0739 by Alferd Apa, RN Outcome: Progressing Goal: Diagnostic test results will improve 12/11/2021 1024 by Alferd Apa, RN Outcome: Adequate for Discharge 12/11/2021 0739 by Alferd Apa, RN Outcome: Progressing Goal: Respiratory complications will improve 12/11/2021 1024 by Alferd Apa, RN Outcome: Adequate for Discharge 12/11/2021 0739 by Alferd Apa, RN Outcome: Progressing Goal: Cardiovascular complication will be avoided 12/11/2021 1024 by Alferd Apa, RN Outcome: Adequate for Discharge 12/11/2021 0739 by Alferd Apa, RN Outcome: Progressing   Problem: Activity: Goal: Risk for activity intolerance will decrease 12/11/2021 1024 by Alferd Apa, RN Outcome: Adequate for Discharge 12/11/2021 0739 by Alferd Apa, RN Outcome: Progressing   Problem: Nutrition: Goal: Adequate nutrition will be maintained 12/11/2021 1024 by Alferd Apa, RN Outcome: Adequate for Discharge 12/11/2021 0739 by Alferd Apa, RN Outcome: Progressing   Problem: Coping: Goal: Level of anxiety  will decrease 12/11/2021 1024 by Alferd Apa, RN Outcome: Adequate for Discharge 12/11/2021 0739 by Alferd Apa, RN Outcome: Progressing   Problem: Elimination: Goal: Will not experience complications related to bowel motility 12/11/2021 1024 by Alferd Apa, RN Outcome: Adequate for Discharge 12/11/2021 0739 by Alferd Apa, RN Outcome: Progressing Goal: Will not experience complications related to urinary retention 12/11/2021 1024 by Alferd Apa, RN Outcome: Adequate for Discharge 12/11/2021 0739 by Alferd Apa, RN Outcome: Progressing   Problem: Pain Managment: Goal: General experience of comfort will improve 12/11/2021 1024 by Alferd Apa, RN Outcome: Adequate for Discharge 12/11/2021 0739 by Alferd Apa, RN Outcome: Progressing   Problem: Safety: Goal: Ability to remain free from injury will improve 12/11/2021 1024 by Alferd Apa, RN Outcome: Adequate for Discharge 12/11/2021 0739 by Alferd Apa, RN Outcome: Progressing   Problem: Skin Integrity: Goal: Risk for impaired skin integrity will decrease 12/11/2021 1024 by Alferd Apa, RN Outcome: Adequate for Discharge 12/11/2021 0739 by Alferd Apa, RN Outcome: Progressing

## 2021-12-12 DIAGNOSIS — S72001D Fracture of unspecified part of neck of right femur, subsequent encounter for closed fracture with routine healing: Secondary | ICD-10-CM | POA: Diagnosis not present

## 2021-12-12 DIAGNOSIS — S32010D Wedge compression fracture of first lumbar vertebra, subsequent encounter for fracture with routine healing: Secondary | ICD-10-CM | POA: Diagnosis not present

## 2021-12-12 DIAGNOSIS — I2510 Atherosclerotic heart disease of native coronary artery without angina pectoris: Secondary | ICD-10-CM | POA: Diagnosis not present

## 2021-12-12 DIAGNOSIS — R4182 Altered mental status, unspecified: Secondary | ICD-10-CM | POA: Diagnosis not present

## 2021-12-13 LAB — CULTURE, BLOOD (ROUTINE X 2)
Culture: NO GROWTH
Culture: NO GROWTH
Special Requests: ADEQUATE
Special Requests: ADEQUATE

## 2021-12-15 DIAGNOSIS — R4182 Altered mental status, unspecified: Secondary | ICD-10-CM | POA: Diagnosis not present

## 2021-12-15 DIAGNOSIS — S72001D Fracture of unspecified part of neck of right femur, subsequent encounter for closed fracture with routine healing: Secondary | ICD-10-CM | POA: Diagnosis not present

## 2021-12-15 DIAGNOSIS — I2510 Atherosclerotic heart disease of native coronary artery without angina pectoris: Secondary | ICD-10-CM | POA: Diagnosis not present

## 2021-12-15 DIAGNOSIS — S32010D Wedge compression fracture of first lumbar vertebra, subsequent encounter for fracture with routine healing: Secondary | ICD-10-CM | POA: Diagnosis not present

## 2021-12-16 DIAGNOSIS — S32010D Wedge compression fracture of first lumbar vertebra, subsequent encounter for fracture with routine healing: Secondary | ICD-10-CM | POA: Diagnosis not present

## 2021-12-16 DIAGNOSIS — R4182 Altered mental status, unspecified: Secondary | ICD-10-CM | POA: Diagnosis not present

## 2021-12-16 DIAGNOSIS — I2510 Atherosclerotic heart disease of native coronary artery without angina pectoris: Secondary | ICD-10-CM | POA: Diagnosis not present

## 2021-12-16 DIAGNOSIS — S72001D Fracture of unspecified part of neck of right femur, subsequent encounter for closed fracture with routine healing: Secondary | ICD-10-CM | POA: Diagnosis not present

## 2021-12-19 DIAGNOSIS — R4182 Altered mental status, unspecified: Secondary | ICD-10-CM | POA: Diagnosis not present

## 2021-12-19 DIAGNOSIS — I10 Essential (primary) hypertension: Secondary | ICD-10-CM | POA: Diagnosis not present

## 2021-12-19 DIAGNOSIS — S32010D Wedge compression fracture of first lumbar vertebra, subsequent encounter for fracture with routine healing: Secondary | ICD-10-CM | POA: Diagnosis not present

## 2021-12-19 DIAGNOSIS — S72001D Fracture of unspecified part of neck of right femur, subsequent encounter for closed fracture with routine healing: Secondary | ICD-10-CM | POA: Diagnosis not present

## 2021-12-22 DIAGNOSIS — S72001D Fracture of unspecified part of neck of right femur, subsequent encounter for closed fracture with routine healing: Secondary | ICD-10-CM | POA: Diagnosis not present

## 2021-12-22 DIAGNOSIS — M25551 Pain in right hip: Secondary | ICD-10-CM | POA: Diagnosis not present

## 2021-12-22 DIAGNOSIS — K5904 Chronic idiopathic constipation: Secondary | ICD-10-CM | POA: Diagnosis not present

## 2021-12-22 DIAGNOSIS — R4182 Altered mental status, unspecified: Secondary | ICD-10-CM | POA: Diagnosis not present

## 2021-12-22 DIAGNOSIS — S32010D Wedge compression fracture of first lumbar vertebra, subsequent encounter for fracture with routine healing: Secondary | ICD-10-CM | POA: Diagnosis not present

## 2021-12-26 DIAGNOSIS — R4182 Altered mental status, unspecified: Secondary | ICD-10-CM | POA: Diagnosis not present

## 2021-12-26 DIAGNOSIS — S32010D Wedge compression fracture of first lumbar vertebra, subsequent encounter for fracture with routine healing: Secondary | ICD-10-CM | POA: Diagnosis not present

## 2021-12-26 DIAGNOSIS — K5904 Chronic idiopathic constipation: Secondary | ICD-10-CM | POA: Diagnosis not present

## 2021-12-26 DIAGNOSIS — S72001D Fracture of unspecified part of neck of right femur, subsequent encounter for closed fracture with routine healing: Secondary | ICD-10-CM | POA: Diagnosis not present

## 2021-12-30 ENCOUNTER — Ambulatory Visit
Admission: RE | Admit: 2021-12-30 | Discharge: 2021-12-30 | Disposition: A | Discharge status: Home / Self Care | Payer: Medicare Other | Source: Ambulatory Visit | Attending: Internal Medicine | Admitting: Internal Medicine

## 2021-12-30 DIAGNOSIS — M8008XA Age-related osteoporosis with current pathological fracture, vertebra(e), initial encounter for fracture: Secondary | ICD-10-CM | POA: Diagnosis not present

## 2021-12-30 DIAGNOSIS — S32010A Wedge compression fracture of first lumbar vertebra, initial encounter for closed fracture: Secondary | ICD-10-CM

## 2021-12-30 HISTORY — PX: IR KYPHO LUMBAR INC FX REDUCE BONE BX UNI/BIL CANNULATION INC/IMAGING: IMG5519

## 2021-12-30 MED ORDER — MIDAZOLAM HCL 2 MG/2ML IJ SOLN
1.0000 mg | INTRAMUSCULAR | Status: DC | PRN
  Administered 2021-12-30: 0.5 mg via INTRAVENOUS
  Administered 2021-12-30: 1 mg via INTRAVENOUS

## 2021-12-30 MED ORDER — FENTANYL CITRATE PF 50 MCG/ML IJ SOSY
25.0000 ug | PREFILLED_SYRINGE | INTRAMUSCULAR | Status: DC | PRN
  Administered 2021-12-30 (×2): 25 ug via INTRAVENOUS

## 2021-12-30 MED ORDER — CEFAZOLIN SODIUM-DEXTROSE 2-4 GM/100ML-% IV SOLN
2.0000 g | INTRAVENOUS | Status: AC
  Administered 2021-12-30: 2 g via INTRAVENOUS

## 2021-12-30 MED ORDER — SODIUM CHLORIDE 0.9 % IV SOLN: INTRAVENOUS | Status: DC

## 2021-12-30 MED ORDER — ACETAMINOPHEN 10 MG/ML IV SOLN: 1000.0000 mg | Freq: Once | INTRAVENOUS | Status: DC

## 2021-12-30 NOTE — Progress Notes (Signed)
Pt back in nursing recovery area. Pt still drowsy from procedure but will wake up when spoken to. Pt follows commands, talks in complete sentences and has no complaints at this time. Pt will remain in nursing station until discharge.  ?

## 2021-12-30 NOTE — Discharge Instructions (Signed)
Kyphoplasty Post Procedure Discharge Instructions  May resume a regular diet and any medications that you routinely take (including pain medications). However, if you are taking Aspirin or an anticoagulant/blood thinner you will be told when you can resume taking these by the healthcare provider. No driving day of procedure. The day of your procedure take it easy. You may use an ice pack as needed to injection sites on back.  Ice to back 30 minutes on and 30 minutes off, as needed. May remove bandaids tomorrow after taking a shower. Replace daily with a clean bandaid until healed.  Do not lift anything heavier than a milk jug for 1-2 weeks or determined by your physician.  Follow up with your physician in 2 weeks.    Please contact our office at [phone] for the following symptoms or if you have any questions:  Fever greater than 100 degrees Increased swelling, pain, or redness at injection site. Increased back and/or leg pain New numbness or change in symptoms from before the procedure.    Thank you for visiting [facility].  May resume aspirin immediately after procedure.  May resume plavix immediately after procedure.

## 2022-01-02 DIAGNOSIS — K5904 Chronic idiopathic constipation: Secondary | ICD-10-CM | POA: Diagnosis not present

## 2022-01-02 DIAGNOSIS — S32010D Wedge compression fracture of first lumbar vertebra, subsequent encounter for fracture with routine healing: Secondary | ICD-10-CM | POA: Diagnosis not present

## 2022-01-02 DIAGNOSIS — S72001D Fracture of unspecified part of neck of right femur, subsequent encounter for closed fracture with routine healing: Secondary | ICD-10-CM | POA: Diagnosis not present

## 2022-01-02 DIAGNOSIS — R4182 Altered mental status, unspecified: Secondary | ICD-10-CM | POA: Diagnosis not present

## 2022-01-08 DIAGNOSIS — Z09 Encounter for follow-up examination after completed treatment for conditions other than malignant neoplasm: Secondary | ICD-10-CM | POA: Diagnosis not present

## 2022-01-08 DIAGNOSIS — Z8781 Personal history of (healed) traumatic fracture: Secondary | ICD-10-CM | POA: Diagnosis not present

## 2022-01-08 DIAGNOSIS — Z9889 Other specified postprocedural states: Secondary | ICD-10-CM | POA: Diagnosis not present

## 2022-01-08 DIAGNOSIS — F03B0 Unspecified dementia, moderate, without behavioral disturbance, psychotic disturbance, mood disturbance, and anxiety: Secondary | ICD-10-CM | POA: Diagnosis not present

## 2022-02-12 DIAGNOSIS — S32010D Wedge compression fracture of first lumbar vertebra, subsequent encounter for fracture with routine healing: Secondary | ICD-10-CM | POA: Diagnosis not present

## 2022-02-12 DIAGNOSIS — F0390 Unspecified dementia without behavioral disturbance: Secondary | ICD-10-CM | POA: Diagnosis not present

## 2022-02-12 DIAGNOSIS — S72141D Displaced intertrochanteric fracture of right femur, subsequent encounter for closed fracture with routine healing: Secondary | ICD-10-CM | POA: Diagnosis not present

## 2022-02-12 DIAGNOSIS — W19XXXD Unspecified fall, subsequent encounter: Secondary | ICD-10-CM | POA: Diagnosis not present

## 2022-04-30 DIAGNOSIS — R7309 Other abnormal glucose: Secondary | ICD-10-CM | POA: Diagnosis not present

## 2022-04-30 DIAGNOSIS — Z8673 Personal history of transient ischemic attack (TIA), and cerebral infarction without residual deficits: Secondary | ICD-10-CM | POA: Diagnosis not present

## 2022-04-30 DIAGNOSIS — I10 Essential (primary) hypertension: Secondary | ICD-10-CM | POA: Diagnosis not present

## 2022-04-30 DIAGNOSIS — Z0000 Encounter for general adult medical examination without abnormal findings: Secondary | ICD-10-CM | POA: Diagnosis not present

## 2022-04-30 DIAGNOSIS — S32010A Wedge compression fracture of first lumbar vertebra, initial encounter for closed fracture: Secondary | ICD-10-CM | POA: Diagnosis not present

## 2022-04-30 DIAGNOSIS — S32010D Wedge compression fracture of first lumbar vertebra, subsequent encounter for fracture with routine healing: Secondary | ICD-10-CM | POA: Diagnosis not present

## 2022-05-05 DIAGNOSIS — R8290 Unspecified abnormal findings in urine: Secondary | ICD-10-CM | POA: Diagnosis not present

## 2022-06-24 IMAGING — MR MR HEAD W/O CM
11 series · 48 of 48 positions shown · non-contrast
Comparison: CT studies earlier same day.  CT 09/04/2020

CLINICAL DATA: Neuro deficit, acute, stroke suspected. Right-sided
deficits.

EXAM:
MRI HEAD WITHOUT CONTRAST
TECHNIQUE: Multiplanar, multiecho pulse sequences of the brain and surrounding
structures were obtained without intravenous contrast.

[Series 5: ax dwi_tracew · axial · 3.0mm · 0.71mm/px · z∈[-118,+46]mm · 5 of 56 slices shown]
[im 1/56]
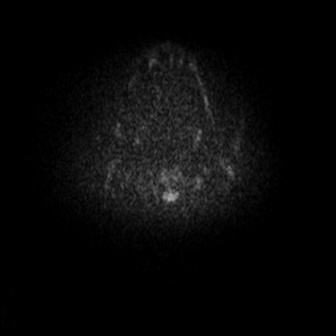
[im 14/56]
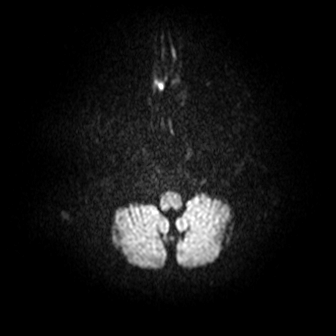
[im 28/56]
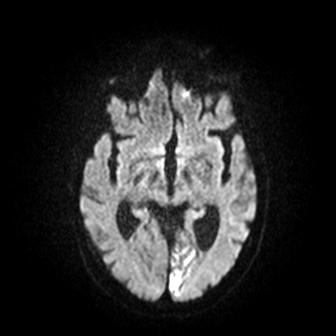
[im 42/56]
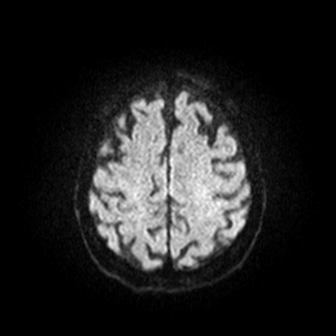
[im 56/56]
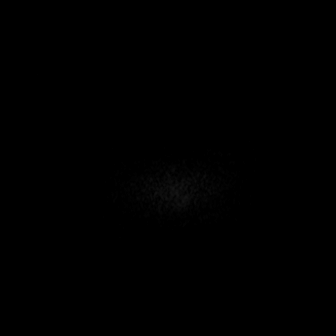

[Series 6: ax dwi_adc · axial · 3.0mm · 0.71mm/px · z∈[-118,+43]mm · 5 of 55 slices shown]
[im 1/55]
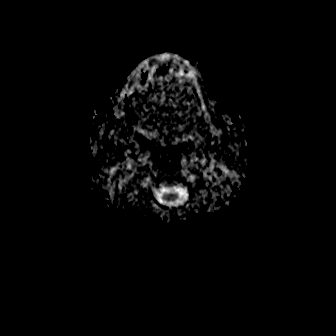
[im 14/55]
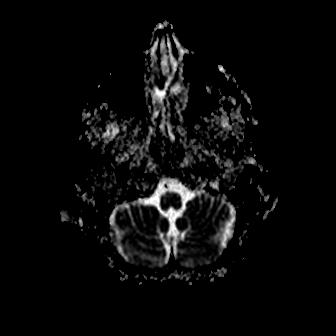
[im 28/55]
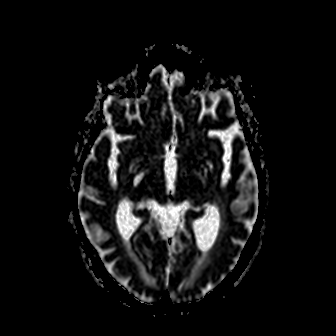
[im 41/55]
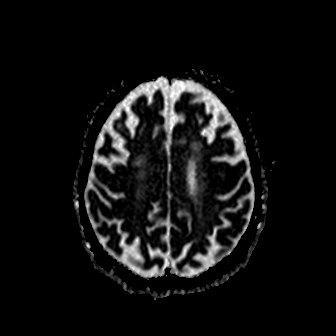
[im 55/55]
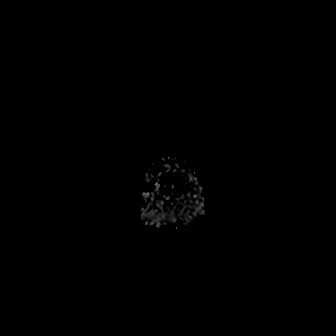

[Series 7: cor dwi_tracew · coronal · 5.0mm · 0.68mm/px · 3 of 40 slices shown]
[im 1/40]
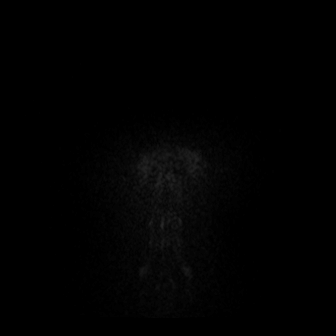
[im 20/40]
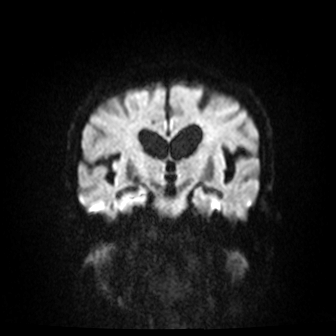
[im 40/40]
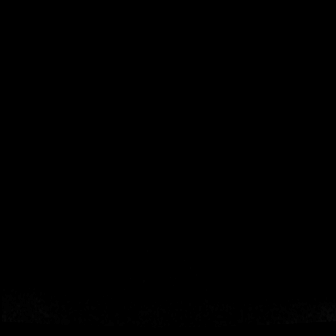

[Series 8: cor dwi_adc · coronal · 5.0mm · 0.68mm/px · 3 of 39 slices shown]
[im 1/39]
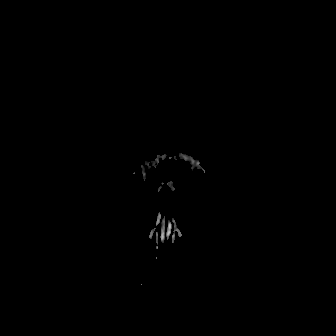
[im 20/39]
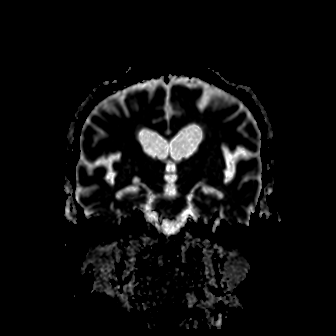
[im 39/39]
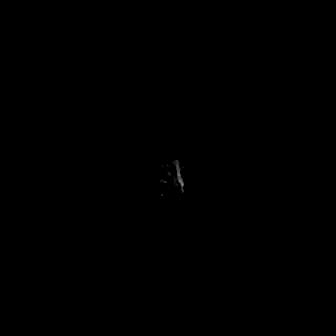

[Series 9: T1 · sagittal · 5.0mm · 0.47mm/px · 2 of 20 slices shown (1 of 2)]
[im 1/20]
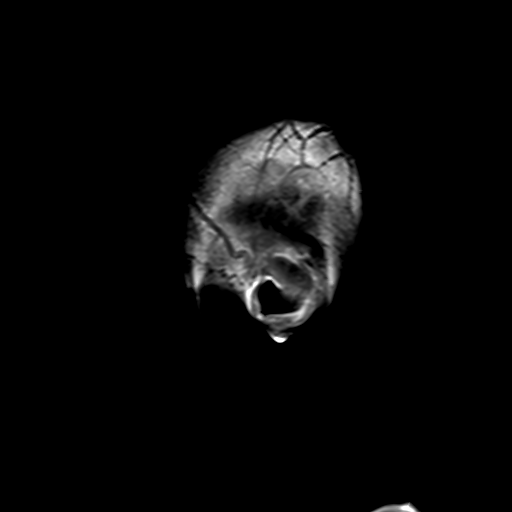
[im 20/20]
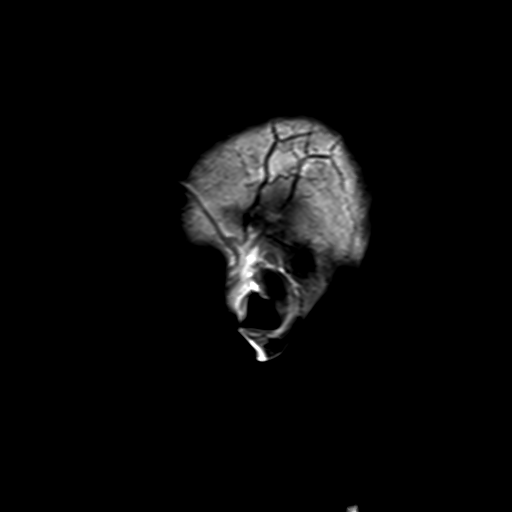

[Series 10: T2 · axial · 5.0mm · 0.86mm/px · z∈[-100,+43]mm · 2 of 25 slices shown (1 of 2)]
[im 1/25]
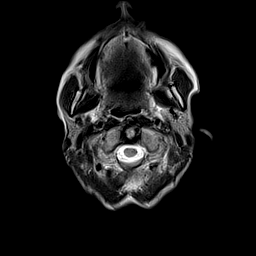
[im 25/25]
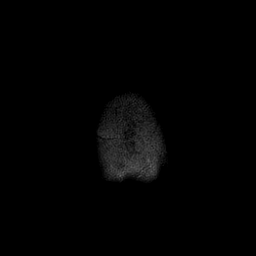

[Series 12: pha_images · axial · 3.0mm · 0.90mm/px · z∈[-105,+47]mm · 4 of 52 slices shown]
[im 1/52]
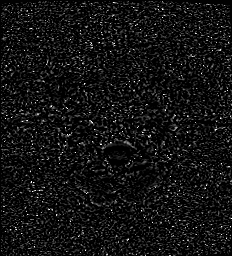
[im 18/52]
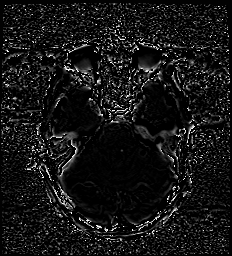
[im 35/52]
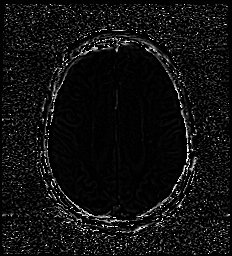
[im 52/52]
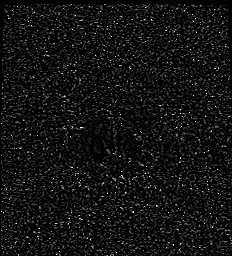

[Series 13: swi_images · axial · 3.0mm · 0.90mm/px · z∈[-105,+47]mm · 4 of 52 slices shown]
[im 1/52]
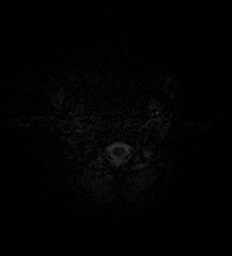
[im 18/52]
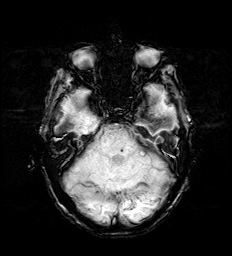
[im 35/52]
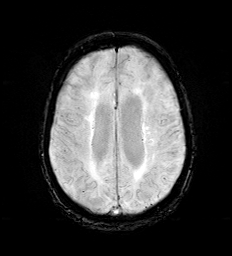
[im 52/52]
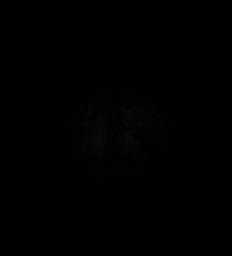

[Series 15: FLAIR · axial · 3.0mm · 0.69mm/px · z∈[-109,+52]mm · 4 of 55 slices shown]
[im 1/55]
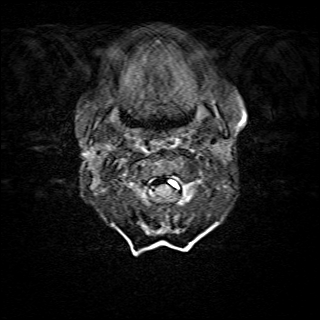
[im 19/55]
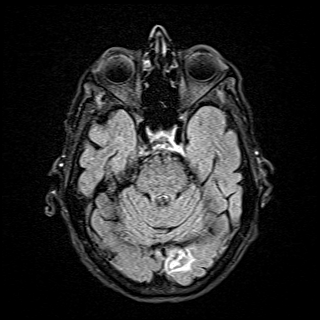
[im 37/55]
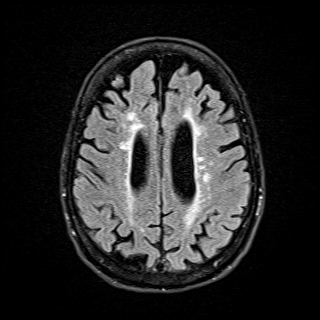
[im 55/55]
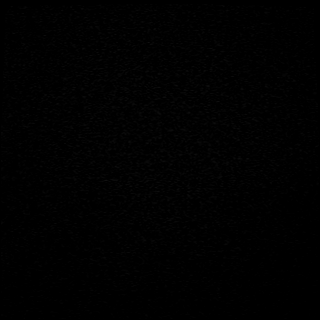

[Series 16: T1 · axial · 1.0mm · 0.98mm/px · z∈[-118,+55]mm · 14 of 176 slices shown (2 of 2)]
[im 1/176]
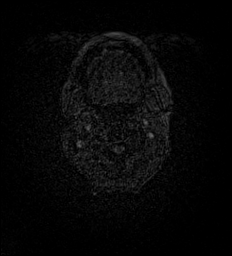
[im 14/176]
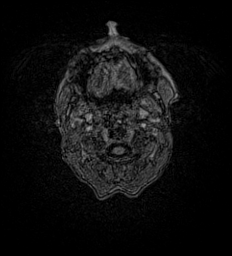
[im 27/176]
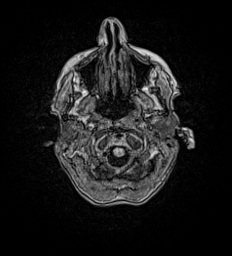
[im 41/176]
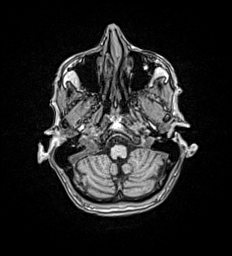
[im 54/176]
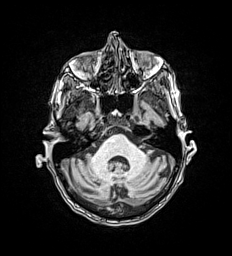
[im 68/176]
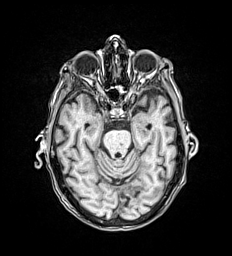
[im 81/176]
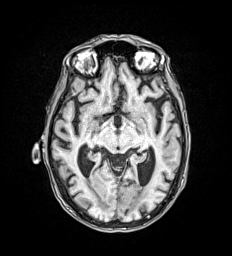
[im 95/176]
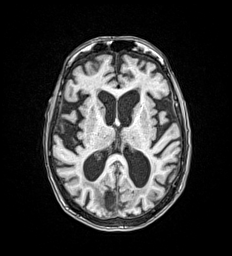
[im 108/176]
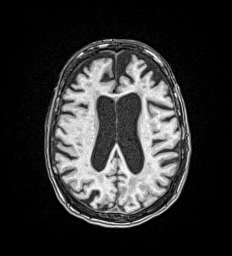
[im 122/176]
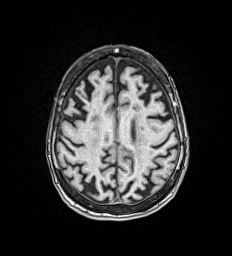
[im 135/176]
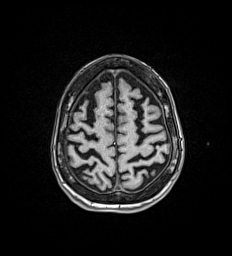
[im 149/176]
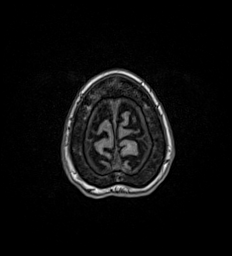
[im 162/176]
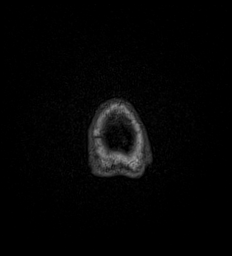
[im 176/176]
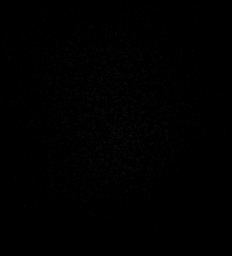

[Series 17: T2 · coronal · 5.0mm · 0.45mm/px · 2 of 31 slices shown (2 of 2)]
[im 1/31]
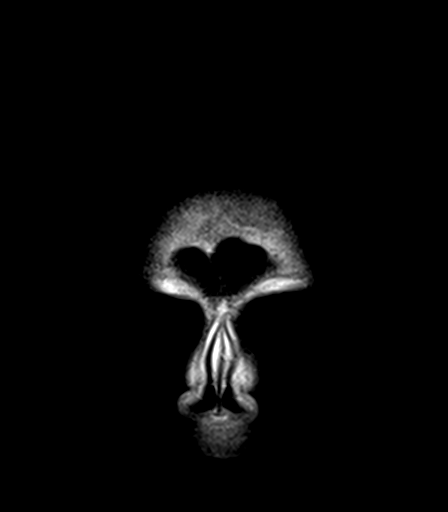
[im 31/31]
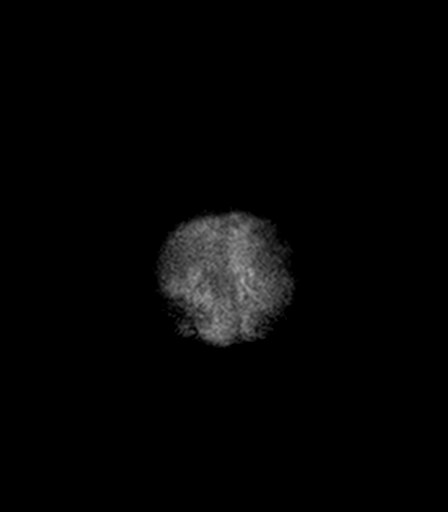

[48 of 48 positions shown; findings below may reference images not displayed]

FINDINGS: Brain: Mild chronic small-vessel ischemic change affects the pons.
There are numerous old small vessel cerebellar infarctions affecting
both hemispheres. Late subacute infarction of the left occipital
lobe with evolutionary changes. No evidence of extension of the
insult. There is been old infarction in the right occipital lobe.
Punctate acute infarction in the right parietal cortical and
subcortical brain without mass effect or hemorrhage. No evidence of
acute hemorrhage, hydrocephalus or extra-axial fluid collection.
Minimal petechial blood products in the old and subacute occipital
infarctions. Chronic small-vessel ischemic change of the white
matter, some of the insults being associated with petechial blood
products.

Vascular: Major vessels at the base of the brain show flow.

Skull and upper cervical spine: Negative

Sinuses/Orbits: Clear/normal

Other: None
IMPRESSION: Late subacute infarction of the left occipital lobe. Old infarction
of the right occipital lobe.

Acute subcentimeter infarction affecting the right posterior
parietal lobe.

Numerous old small vessel infarctions affecting both cerebellar
hemispheres. Chronic small-vessel ischemic changes of the pons and
cerebral hemispheric white matter.

## 2022-08-14 ENCOUNTER — Inpatient Hospital Stay: Payer: Medicare Other | Admitting: Anesthesiology

## 2022-08-14 ENCOUNTER — Other Ambulatory Visit: Payer: Self-pay

## 2022-08-14 ENCOUNTER — Inpatient Hospital Stay: Payer: Medicare Other

## 2022-08-14 ENCOUNTER — Encounter
Admission: EM | Disposition: A | Discharge status: Skilled Nursing Facility | Payer: Self-pay | Source: Home / Self Care | Attending: Internal Medicine

## 2022-08-14 ENCOUNTER — Emergency Department: Payer: Medicare Other

## 2022-08-14 ENCOUNTER — Inpatient Hospital Stay
Admission: EM | Admit: 2022-08-14 | Discharge: 2022-08-18 | DRG: 481 | Disposition: A | Discharge status: Skilled Nursing Facility | Payer: Medicare Other | Attending: Internal Medicine | Admitting: Internal Medicine

## 2022-08-14 ENCOUNTER — Encounter: Payer: Self-pay | Admitting: Anesthesiology

## 2022-08-14 DIAGNOSIS — Z79899 Other long term (current) drug therapy: Secondary | ICD-10-CM | POA: Diagnosis not present

## 2022-08-14 DIAGNOSIS — Y92002 Bathroom of unspecified non-institutional (private) residence single-family (private) house as the place of occurrence of the external cause: Secondary | ICD-10-CM | POA: Diagnosis not present

## 2022-08-14 DIAGNOSIS — I2510 Atherosclerotic heart disease of native coronary artery without angina pectoris: Secondary | ICD-10-CM | POA: Diagnosis present

## 2022-08-14 DIAGNOSIS — I440 Atrioventricular block, first degree: Secondary | ICD-10-CM | POA: Diagnosis present

## 2022-08-14 DIAGNOSIS — M6289 Other specified disorders of muscle: Secondary | ICD-10-CM | POA: Diagnosis present

## 2022-08-14 DIAGNOSIS — F0390 Unspecified dementia without behavioral disturbance: Secondary | ICD-10-CM | POA: Diagnosis present

## 2022-08-14 DIAGNOSIS — M810 Age-related osteoporosis without current pathological fracture: Secondary | ICD-10-CM | POA: Diagnosis present

## 2022-08-14 DIAGNOSIS — S72002A Fracture of unspecified part of neck of left femur, initial encounter for closed fracture: Principal | ICD-10-CM | POA: Diagnosis present

## 2022-08-14 DIAGNOSIS — G8929 Other chronic pain: Secondary | ICD-10-CM | POA: Diagnosis present

## 2022-08-14 DIAGNOSIS — F03B0 Unspecified dementia, moderate, without behavioral disturbance, psychotic disturbance, mood disturbance, and anxiety: Secondary | ICD-10-CM | POA: Diagnosis present

## 2022-08-14 DIAGNOSIS — Z8673 Personal history of transient ischemic attack (TIA), and cerebral infarction without residual deficits: Secondary | ICD-10-CM | POA: Diagnosis not present

## 2022-08-14 DIAGNOSIS — Z7982 Long term (current) use of aspirin: Secondary | ICD-10-CM

## 2022-08-14 DIAGNOSIS — Z66 Do not resuscitate: Secondary | ICD-10-CM | POA: Diagnosis present

## 2022-08-14 DIAGNOSIS — W010XXA Fall on same level from slipping, tripping and stumbling without subsequent striking against object, initial encounter: Secondary | ICD-10-CM | POA: Diagnosis present

## 2022-08-14 DIAGNOSIS — Z951 Presence of aortocoronary bypass graft: Secondary | ICD-10-CM

## 2022-08-14 DIAGNOSIS — S72145A Nondisplaced intertrochanteric fracture of left femur, initial encounter for closed fracture: Secondary | ICD-10-CM | POA: Diagnosis present

## 2022-08-14 DIAGNOSIS — Z7902 Long term (current) use of antithrombotics/antiplatelets: Secondary | ICD-10-CM

## 2022-08-14 DIAGNOSIS — I252 Old myocardial infarction: Secondary | ICD-10-CM

## 2022-08-14 DIAGNOSIS — Z681 Body mass index (BMI) 19 or less, adult: Secondary | ICD-10-CM | POA: Diagnosis not present

## 2022-08-14 DIAGNOSIS — E785 Hyperlipidemia, unspecified: Secondary | ICD-10-CM | POA: Diagnosis present

## 2022-08-14 DIAGNOSIS — I447 Left bundle-branch block, unspecified: Secondary | ICD-10-CM | POA: Diagnosis present

## 2022-08-14 DIAGNOSIS — I10 Essential (primary) hypertension: Secondary | ICD-10-CM | POA: Diagnosis present

## 2022-08-14 DIAGNOSIS — R64 Cachexia: Secondary | ICD-10-CM | POA: Diagnosis present

## 2022-08-14 DIAGNOSIS — R296 Repeated falls: Secondary | ICD-10-CM | POA: Diagnosis present

## 2022-08-14 HISTORY — PX: INTRAMEDULLARY (IM) NAIL INTERTROCHANTERIC: SHX5875

## 2022-08-14 LAB — BASIC METABOLIC PANEL
Anion gap: 9 (ref 5–15)
BUN: 16 mg/dL (ref 8–23)
CO2: 26 mmol/L (ref 22–32)
Calcium: 9.1 mg/dL (ref 8.9–10.3)
Chloride: 106 mmol/L (ref 98–111)
Creatinine, Ser: 0.98 mg/dL (ref 0.44–1.00)
GFR, Estimated: 54 mL/min — ABNORMAL LOW (ref >60)
Glucose, Bld: 129 mg/dL — ABNORMAL HIGH (ref 70–99)
Potassium: 3.4 mmol/L — ABNORMAL LOW (ref 3.5–5.1)
Sodium: 141 mmol/L (ref 135–145)

## 2022-08-14 LAB — CBC
HCT: 33.6 % — ABNORMAL LOW (ref 36.0–46.0)
Hemoglobin: 11.1 g/dL — ABNORMAL LOW (ref 12.0–15.0)
MCH: 29.8 pg (ref 26.0–34.0)
MCHC: 33 g/dL (ref 30.0–36.0)
MCV: 90.1 fL (ref 80.0–100.0)
Platelets: 155 10*3/uL (ref 150–400)
RBC: 3.73 MIL/uL — ABNORMAL LOW (ref 3.87–5.11)
RDW: 12.6 % (ref 11.5–15.5)
WBC: 9.9 10*3/uL (ref 4.0–10.5)
nRBC: 0 % (ref 0.0–0.2)

## 2022-08-14 LAB — PROTIME-INR
INR: 1.1 (ref 0.8–1.2)
Prothrombin Time: 14.5 seconds (ref 11.4–15.2)

## 2022-08-14 LAB — TYPE AND SCREEN
ABO/RH(D): O POS
Antibody Screen: NEGATIVE

## 2022-08-14 SURGERY — FIXATION, FRACTURE, INTERTROCHANTERIC, WITH INTRAMEDULLARY ROD
Anesthesia: General | Site: Hip | Laterality: Left

## 2022-08-14 MED ORDER — ATORVASTATIN CALCIUM 20 MG PO TABS
40.0000 mg | ORAL_TABLET | Freq: Every day | ORAL | Status: DC
  Administered 2022-08-15 – 2022-08-18 (×4): 40 mg via ORAL
  Filled 2022-08-14 (×4): qty 2

## 2022-08-14 MED ORDER — HYDROMORPHONE HCL 1 MG/ML IJ SOLN: 0.5000 mg | INTRAMUSCULAR | Status: DC | PRN

## 2022-08-14 MED ORDER — BISACODYL 10 MG RE SUPP: 10.0000 mg | Freq: Every day | RECTAL | Status: DC | PRN

## 2022-08-14 MED ORDER — SODIUM CHLORIDE 0.9 % IV SOLN: INTRAVENOUS | Status: DC

## 2022-08-14 MED ORDER — FENTANYL CITRATE (PF) 100 MCG/2ML IJ SOLN
INTRAMUSCULAR | Status: AC
  Filled 2022-08-14: qty 2

## 2022-08-14 MED ORDER — EPINEPHRINE PF 1 MG/ML IJ SOLN
INTRAMUSCULAR | Status: AC
  Filled 2022-08-14: qty 1

## 2022-08-14 MED ORDER — ROCURONIUM BROMIDE 10 MG/ML (PF) SYRINGE
PREFILLED_SYRINGE | INTRAVENOUS | Status: AC
  Filled 2022-08-14: qty 10

## 2022-08-14 MED ORDER — PROPOFOL 10 MG/ML IV BOLUS
INTRAVENOUS | Status: AC
  Filled 2022-08-14: qty 20

## 2022-08-14 MED ORDER — DOCUSATE SODIUM 100 MG PO CAPS
100.0000 mg | ORAL_CAPSULE | Freq: Two times a day (BID) | ORAL | Status: DC
  Administered 2022-08-14 – 2022-08-18 (×8): 100 mg via ORAL
  Filled 2022-08-14 (×8): qty 1

## 2022-08-14 MED ORDER — SODIUM CHLORIDE 0.9 % IV BOLUS
500.0000 mL | Freq: Once | INTRAVENOUS | Status: AC
  Administered 2022-08-14: 500 mL via INTRAVENOUS

## 2022-08-14 MED ORDER — PROPOFOL 10 MG/ML IV BOLUS
INTRAVENOUS | Status: DC | PRN
  Administered 2022-08-14: 50 mg via INTRAVENOUS

## 2022-08-14 MED ORDER — AMLODIPINE BESYLATE 10 MG PO TABS
10.0000 mg | ORAL_TABLET | Freq: Every day | ORAL | Status: DC
  Administered 2022-08-15 – 2022-08-18 (×4): 10 mg via ORAL
  Filled 2022-08-14 (×4): qty 1

## 2022-08-14 MED ORDER — HYDROMORPHONE HCL 1 MG/ML IJ SOLN: 0.2500 mg | INTRAMUSCULAR | Status: DC | PRN

## 2022-08-14 MED ORDER — ACETAMINOPHEN 325 MG PO TABS
650.0000 mg | ORAL_TABLET | Freq: Once | ORAL | Status: AC
  Administered 2022-08-14: 650 mg via ORAL
  Filled 2022-08-14: qty 2

## 2022-08-14 MED ORDER — ROCURONIUM BROMIDE 100 MG/10ML IV SOLN
INTRAVENOUS | Status: DC | PRN
  Administered 2022-08-14: 40 mg via INTRAVENOUS

## 2022-08-14 MED ORDER — ONDANSETRON HCL 4 MG/2ML IJ SOLN: 4.0000 mg | Freq: Four times a day (QID) | INTRAMUSCULAR | Status: DC | PRN

## 2022-08-14 MED ORDER — ONDANSETRON HCL 4 MG/2ML IJ SOLN
INTRAMUSCULAR | Status: DC | PRN
  Administered 2022-08-14: 4 mg via INTRAVENOUS

## 2022-08-14 MED ORDER — FENTANYL CITRATE (PF) 100 MCG/2ML IJ SOLN
INTRAMUSCULAR | Status: DC | PRN
  Administered 2022-08-14: 50 ug via INTRAVENOUS
  Administered 2022-08-14 (×2): 25 ug via INTRAVENOUS

## 2022-08-14 MED ORDER — 0.9 % SODIUM CHLORIDE (POUR BTL) OPTIME
TOPICAL | Status: DC | PRN
  Administered 2022-08-14: 500 mL

## 2022-08-14 MED ORDER — SUGAMMADEX SODIUM 200 MG/2ML IV SOLN
INTRAVENOUS | Status: DC | PRN
  Administered 2022-08-14: 79.8 mg via INTRAVENOUS

## 2022-08-14 MED ORDER — CEFAZOLIN SODIUM-DEXTROSE 2-4 GM/100ML-% IV SOLN
2.0000 g | Freq: Four times a day (QID) | INTRAVENOUS | Status: AC
  Administered 2022-08-14 – 2022-08-15 (×3): 2 g via INTRAVENOUS
  Filled 2022-08-14 (×3): qty 100

## 2022-08-14 MED ORDER — PHENYLEPHRINE HCL-NACL 20-0.9 MG/250ML-% IV SOLN
INTRAVENOUS | Status: DC | PRN
  Administered 2022-08-14: 25 ug/min via INTRAVENOUS

## 2022-08-14 MED ORDER — FENTANYL CITRATE PF 50 MCG/ML IJ SOSY
12.5000 ug | PREFILLED_SYRINGE | Freq: Once | INTRAMUSCULAR | Status: AC
  Administered 2022-08-14: 12.5 ug via INTRAVENOUS
  Filled 2022-08-14: qty 1

## 2022-08-14 MED ORDER — ONDANSETRON HCL 4 MG PO TABS: 4.0000 mg | ORAL_TABLET | Freq: Four times a day (QID) | ORAL | Status: DC | PRN

## 2022-08-14 MED ORDER — CEFAZOLIN SODIUM-DEXTROSE 2-4 GM/100ML-% IV SOLN
INTRAVENOUS | Status: AC
  Filled 2022-08-14: qty 100

## 2022-08-14 MED ORDER — GLYCOPYRROLATE 0.2 MG/ML IJ SOLN
INTRAMUSCULAR | Status: AC
  Filled 2022-08-14: qty 1

## 2022-08-14 MED ORDER — ONDANSETRON HCL 4 MG/2ML IJ SOLN
INTRAMUSCULAR | Status: AC
  Filled 2022-08-14: qty 2

## 2022-08-14 MED ORDER — ACETAMINOPHEN 325 MG PO TABS
325.0000 mg | ORAL_TABLET | Freq: Four times a day (QID) | ORAL | Status: DC | PRN
  Administered 2022-08-16 – 2022-08-18 (×4): 650 mg via ORAL
  Filled 2022-08-14 (×4): qty 2

## 2022-08-14 MED ORDER — BUPIVACAINE-EPINEPHRINE (PF) 0.5% -1:200000 IJ SOLN
INTRAMUSCULAR | Status: DC | PRN
  Administered 2022-08-14: 20 mL

## 2022-08-14 MED ORDER — HYDROCODONE-ACETAMINOPHEN 5-325 MG PO TABS
1.0000 | ORAL_TABLET | ORAL | Status: DC | PRN
  Administered 2022-08-16: 1 via ORAL
  Filled 2022-08-14: qty 1

## 2022-08-14 MED ORDER — DEXAMETHASONE SODIUM PHOSPHATE 10 MG/ML IJ SOLN
INTRAMUSCULAR | Status: DC | PRN
  Administered 2022-08-14: 5 mg via INTRAVENOUS

## 2022-08-14 MED ORDER — LACTATED RINGERS IV SOLN: INTRAVENOUS | Status: DC | PRN

## 2022-08-14 MED ORDER — MAGNESIUM HYDROXIDE 400 MG/5ML PO SUSP: 30.0000 mL | Freq: Every day | ORAL | Status: DC | PRN

## 2022-08-14 MED ORDER — METOCLOPRAMIDE HCL 5 MG/ML IJ SOLN: 5.0000 mg | Freq: Three times a day (TID) | INTRAMUSCULAR | Status: DC | PRN

## 2022-08-14 MED ORDER — ACETAMINOPHEN 500 MG PO TABS
500.0000 mg | ORAL_TABLET | Freq: Four times a day (QID) | ORAL | Status: AC
  Administered 2022-08-14 – 2022-08-15 (×4): 500 mg via ORAL
  Filled 2022-08-14 (×4): qty 1

## 2022-08-14 MED ORDER — DIPHENHYDRAMINE HCL 12.5 MG/5ML PO ELIX: 12.5000 mg | ORAL_SOLUTION | ORAL | Status: DC | PRN

## 2022-08-14 MED ORDER — POLYETHYLENE GLYCOL 3350 17 G PO PACK
17.0000 g | PACK | Freq: Every day | ORAL | Status: DC | PRN
  Administered 2022-08-16: 17 g via ORAL
  Filled 2022-08-14: qty 1

## 2022-08-14 MED ORDER — ENSURE ENLIVE PO LIQD
237.0000 mL | Freq: Two times a day (BID) | ORAL | Status: DC
  Administered 2022-08-15 – 2022-08-18 (×5): 237 mL via ORAL

## 2022-08-14 MED ORDER — FENTANYL CITRATE (PF) 100 MCG/2ML IJ SOLN
25.0000 ug | INTRAMUSCULAR | Status: DC | PRN
  Administered 2022-08-14 (×3): 25 ug via INTRAVENOUS

## 2022-08-14 MED ORDER — CEFAZOLIN SODIUM-DEXTROSE 2-4 GM/100ML-% IV SOLN
2.0000 g | INTRAVENOUS | Status: AC
  Administered 2022-08-14: 2 g via INTRAVENOUS

## 2022-08-14 MED ORDER — METOCLOPRAMIDE HCL 5 MG PO TABS: 5.0000 mg | ORAL_TABLET | Freq: Three times a day (TID) | ORAL | Status: DC | PRN

## 2022-08-14 MED ORDER — ENOXAPARIN SODIUM 40 MG/0.4ML IJ SOSY
40.0000 mg | PREFILLED_SYRINGE | INTRAMUSCULAR | Status: DC
  Administered 2022-08-15 – 2022-08-17 (×3): 40 mg via SUBCUTANEOUS
  Filled 2022-08-14 (×3): qty 0.4

## 2022-08-14 MED ORDER — BUPIVACAINE HCL (PF) 0.5 % IJ SOLN
INTRAMUSCULAR | Status: AC
  Filled 2022-08-14: qty 30

## 2022-08-14 MED ORDER — FLEET ENEMA 7-19 GM/118ML RE ENEM: 1.0000 | ENEMA | Freq: Once | RECTAL | Status: DC | PRN

## 2022-08-14 MED ORDER — DEXAMETHASONE SODIUM PHOSPHATE 10 MG/ML IJ SOLN
INTRAMUSCULAR | Status: AC
  Filled 2022-08-14: qty 1

## 2022-08-14 MED ORDER — LIDOCAINE HCL (CARDIAC) PF 100 MG/5ML IV SOSY
PREFILLED_SYRINGE | INTRAVENOUS | Status: DC | PRN
  Administered 2022-08-14: 50 mg via INTRAVENOUS

## 2022-08-14 MED ORDER — ACETAMINOPHEN 325 MG PO TABS: 650.0000 mg | ORAL_TABLET | Freq: Four times a day (QID) | ORAL | Status: DC | PRN

## 2022-08-14 SURGICAL SUPPLY — 46 items
APL PRP STRL LF DISP 70% ISPRP (MISCELLANEOUS) ×2
BIT DRILL 4.3MMS DISTAL GRDTED (BIT) IMPLANT
BNDG CMPR 5X4 CHSV STRCH STRL (GAUZE/BANDAGES/DRESSINGS) ×1
BNDG CMPR 5X6 CHSV STRCH STRL (GAUZE/BANDAGES/DRESSINGS) ×2
BNDG COHESIVE 4X5 TAN STRL LF (GAUZE/BANDAGES/DRESSINGS) ×1 IMPLANT
BNDG COHESIVE 6X5 TAN ST LF (GAUZE/BANDAGES/DRESSINGS) ×1 IMPLANT
CHLORAPREP W/TINT 26 (MISCELLANEOUS) ×2 IMPLANT
DRAPE 3/4 80X56 (DRAPES) ×1 IMPLANT
DRAPE C-ARMOR (DRAPES) ×1 IMPLANT
DRILL 4.3MMS DISTAL GRADUATED (BIT) ×1
DRSG MEPILEX SACRM 8.7X9.8 (GAUZE/BANDAGES/DRESSINGS) ×1 IMPLANT
DRSG OPSITE POSTOP 3X4 (GAUZE/BANDAGES/DRESSINGS) IMPLANT
DRSG OPSITE POSTOP 4X6 (GAUZE/BANDAGES/DRESSINGS) IMPLANT
ELECT CAUTERY BLADE 6.4 (BLADE) ×1 IMPLANT
ELECT REM PT RETURN 9FT ADLT (ELECTROSURGICAL) ×1
ELECTRODE REM PT RTRN 9FT ADLT (ELECTROSURGICAL) ×1 IMPLANT
GAUZE SPONGE 4X4 12PLY STRL (GAUZE/BANDAGES/DRESSINGS) ×1 IMPLANT
GLOVE BIO SURGEON STRL SZ8 (GLOVE) ×2 IMPLANT
GLOVE INDICATOR 8.0 STRL GRN (GLOVE) ×1 IMPLANT
GOWN STRL REUS W/ TWL LRG LVL3 (GOWN DISPOSABLE) ×1 IMPLANT
GOWN STRL REUS W/ TWL XL LVL3 (GOWN DISPOSABLE) ×1 IMPLANT
GOWN STRL REUS W/TWL LRG LVL3 (GOWN DISPOSABLE) ×1
GOWN STRL REUS W/TWL XL LVL3 (GOWN DISPOSABLE) ×1
GUIDEPIN VERSANAIL DSP 3.2X444 (ORTHOPEDIC DISPOSABLE SUPPLIES) IMPLANT
GUIDEWIRE BALL NOSE 80CM (WIRE) IMPLANT
HANDLE YANKAUER SUCT OPEN TIP (MISCELLANEOUS) ×1 IMPLANT
MANIFOLD NEPTUNE II (INSTRUMENTS) ×1 IMPLANT
MAT ABSORB FLUID 56X50 GRAY (MISCELLANEOUS) ×1 IMPLANT
NAIL HIP FRA AFFIX 130X9X340 L (Nail) IMPLANT
NDL FILTER BLUNT 18X1 1/2 (NEEDLE) ×1 IMPLANT
NDL HYPO 22X1.5 SAFETY MO (MISCELLANEOUS) ×1 IMPLANT
NEEDLE FILTER BLUNT 18X1 1/2 (NEEDLE) IMPLANT
NEEDLE HYPO 22X1.5 SAFETY MO (MISCELLANEOUS) ×1 IMPLANT
NS IRRIG 500ML POUR BTL (IV SOLUTION) ×1 IMPLANT
PACK HIP COMPR (MISCELLANEOUS) ×1 IMPLANT
SCREW BONE CORTICAL 5.0X36 (Screw) IMPLANT
SCREW LAG HIP NAIL 10.5X95 (Screw) IMPLANT
STAPLER SKIN PROX 35W (STAPLE) ×1 IMPLANT
STRAP SAFETY 5IN WIDE (MISCELLANEOUS) ×1 IMPLANT
SUT VIC AB 0 CT1 36 (SUTURE) ×1 IMPLANT
SUT VIC AB 1 CT1 36 (SUTURE) ×1 IMPLANT
SUT VIC AB 2-0 CT1 (SUTURE) ×2 IMPLANT
SYR 10ML LL (SYRINGE) ×1 IMPLANT
SYR 30ML LL (SYRINGE) ×1 IMPLANT
TAPE MICROFOAM 4IN (TAPE) ×1 IMPLANT
TRAP FLUID SMOKE EVACUATOR (MISCELLANEOUS) ×1 IMPLANT

## 2022-08-14 NOTE — ED Triage Notes (Signed)
Pt. To ED from home via EMS for left hip/thigh pain after fall last night around midnight. Pt. States her husband picked her up and put her back in bed. Hx of dementia.

## 2022-08-14 NOTE — Anesthesia Preprocedure Evaluation (Signed)
Anesthesia Evaluation  Patient identified by MRN, date of birth, ID band Patient awake and Patient confused    Reviewed: Allergy & Precautions, NPO status , Patient's Chart, lab work & pertinent test results  History of Anesthesia Complications Negative for: history of anesthetic complications  Airway Mallampati: III  TM Distance: <3 FB Neck ROM: full    Dental  (+) Chipped, Missing, Edentulous Upper   Pulmonary neg pulmonary ROS   Pulmonary exam normal        Cardiovascular hypertension, (-) angina + CAD and + Past MI  Normal cardiovascular exam(-) dysrhythmias + Valvular Problems/Murmurs      Neuro/Psych neg Seizures PSYCHIATRIC DISORDERS     Dementia TIACVA    GI/Hepatic negative GI ROS, Neg liver ROS,,,  Endo/Other  negative endocrine ROS    Renal/GU      Musculoskeletal   Abdominal   Peds  Hematology negative hematology ROS (+)   Anesthesia Other Findings Past Medical History: No date: Coronary artery disease     Comment:  Dr. Gwen Pounds  Past Surgical History: 2009: CORONARY ARTERY BYPASS GRAFT     Comment:  Dr. Zebedee Iba at [facility] 12/02/2021: IR RADIOLOGIST EVAL & MGMT No date: VAGINAL DELIVERY     Comment:  3  BMI    Body Mass Index: 16.25 kg/m      Reproductive/Obstetrics negative OB ROS                             Anesthesia Physical Anesthesia Plan  ASA: 3  Anesthesia Plan: General   Post-op Pain Management:    Induction: Intravenous  PONV Risk Score and Plan: Ondansetron, Dexamethasone and Treatment may vary due to age or medical condition  Airway Management Planned: Oral ETT  Additional Equipment:   Intra-op Plan:   Post-operative Plan: Extubation in OR  Informed Consent: I have reviewed the patients History and Physical, chart, labs and discussed the procedure including the risks, benefits and alternatives for the proposed anesthesia with the patient or  authorized representative who has indicated his/her understanding and acceptance.   Patient has DNR.  Discussed DNR with power of attorney and Suspend DNR.   Dental Advisory Given  Plan Discussed with: Anesthesiologist, CRNA and Surgeon  Anesthesia Plan Comments: ( History and phone consent from the patients daughter Ricci Barker at [phone] who says that she has been signing the patients consents  Daughter consented for risks of anesthesia including but not limited to:  - adverse reactions to medications - damage to eyes, teeth, lips or other oral mucosa - nerve damage due to positioning  - sore throat or hoarseness - Damage to heart, brain, nerves, lungs, other parts of body or loss of life  She voiced understanding.)        Anesthesia Quick Evaluation

## 2022-08-14 NOTE — ED Notes (Signed)
Pt. Returned from rad. ?

## 2022-08-14 NOTE — Plan of Care (Signed)

## 2022-08-14 NOTE — Assessment & Plan Note (Signed)
Patient has a history of moderate dementia with no behavioral disturbance.  She is currently at baseline.  - Delirium precautions

## 2022-08-14 NOTE — Assessment & Plan Note (Signed)
Patient states she continues to have reproducible chest pain since her CABG in 2009.  No chest pain concerning for active cardiovascular disease.  - Hold home Plavix in the setting of surgery - Continue home statin

## 2022-08-14 NOTE — Anesthesia Postprocedure Evaluation (Signed)
Anesthesia Post Note  Patient: Madison Brown  Procedure(s) Performed: INTRAMEDULLARY (IM) NAIL INTERTROCHANTERIC (Left: Hip)  Patient location during evaluation: PACU Anesthesia Type: General Level of consciousness: awake and alert, oriented and patient cooperative Pain management: pain level controlled Vital Signs Assessment: post-procedure vital signs reviewed and stable Respiratory status: spontaneous breathing, nonlabored ventilation and respiratory function stable Cardiovascular status: blood pressure returned to baseline and stable Postop Assessment: adequate PO intake Anesthetic complications: no   No notable events documented.   Last Vitals:  Vitals:   08/14/22 1715 08/14/22 1716  BP: (!) 150/58   Pulse: 80 80  Resp: (!) 29 (!) 26  Temp:    SpO2: (!) 86% 95%    Last Pain:  Vitals:   08/14/22 1416  TempSrc: Temporal  PainSc: 7                  Reed Breech

## 2022-08-14 NOTE — Assessment & Plan Note (Signed)
Patient is presenting after ground-level fall with evidence of a left hip fracture.  Plan is for the OR today.  - Orthopedic surgery consulted; appreciate their recommendations - N.p.o. - PT/OT postoperatively per orthopedic surgery - Dilaudid for pain control

## 2022-08-14 NOTE — Transfer of Care (Signed)
Immediate Anesthesia Transfer of Care Note  Patient: Madison Brown  Procedure(s) Performed: INTRAMEDULLARY (IM) NAIL INTERTROCHANTERIC (Left: Hip)  Patient Location: PACU  Anesthesia Type:General  Level of Consciousness: awake and alert   Airway & Oxygen Therapy: Patient Spontanous Breathing  Post-op Assessment: Report given to RN and Post -op Vital signs reviewed and stable  Post vital signs: Reviewed and stable  Last Vitals:  Vitals Value Taken Time  BP 158/93 08/14/22 1617  Temp    Pulse 95 08/14/22 1622  Resp 21 08/14/22 1622  SpO2 97 % 08/14/22 1622  Vitals shown include unvalidated device data.  Last Pain:  Vitals:   08/14/22 1416  TempSrc: Temporal  PainSc: 7          Complications: No notable events documented.

## 2022-08-14 NOTE — ED Notes (Signed)
Dr. Basaraba at bedside 

## 2022-08-14 NOTE — H&P (Signed)
History and Physical    Patient: Madison Brown WUJ:[id number] DOB: 10/15/1929 DOA: 08/14/2022 DOS: the patient was seen and examined on 08/14/2022 PCP: Enid Baas, MD  Patient coming from: Home  Chief Complaint:  Chief Complaint  Patient presents with   Fall   HPI: [name] is a 89 y.o. female with medical history significant of CAD s/p CABG (2009), compression fracture s/p kyphoplasty, moderate dementia, hypertension, CVA, carotid artery disease, hyperlipidemia, who presents to the ED due to ground-level fall.  [name] states that she was trying to walk to the bathroom without her walker last night when she lost her footing and fell.  She denies any symptoms including dizziness, chest pain, shortness of breath prior to or after the fall.  Her daughter at bedside states that patient occasionally has falls when she tries to walk without her walker or with someone assisting her.  At this time, she is experiencing left hip pain.  In addition, she endorses chest pain that is chronic and has been present ever since her CABG in 2009.  Last meal was yesterday at 4 PM.  Last Plavix dose was yesterday morning.  ED course: On arrival to the ED, patient was hypertensive at 151/75 with heart rate of 108.  She was saturating at 98% on room air.  She was afebrile at 98.8. Initial workup demonstrated hemoglobin 11.1, potassium 3.4, glucose 129, creatinine 0.98 with GFR 54.  CT head and C-spine demonstrated no acute abnormalities.  Hip x-ray demonstrated left greater trochanter fracture which was confirmed on MRI.  Knee x-ray did not demonstrate any acute findings.  MRI also demonstrated associated hemorrhage within the left gluteus medius and quadratus femoris muscles.  Orthopedic surgery was consulted with plans for the OR today.  [facility] contacted for admission.  Review of Systems: As mentioned in the history of present illness. All other systems reviewed and are negative.  Past Medical  History:  Diagnosis Date   Coronary artery disease    Dr. Gwen Pounds   Past Surgical History:  Procedure Laterality Date   CORONARY ARTERY BYPASS GRAFT  2009   Dr. Zebedee Iba at [facility] ([place] NAIL INTERTROCHANTERIC Right 12/08/2021   Procedure: INTRAMEDULLARY ([facility]) NAIL INTERTROCHANTERIC;  Surgeon: Signa Kell, MD;  Location: [facility] ORS;  Service: Orthopedics;  Laterality: Right;   IR KYPHO LUMBAR INC FX REDUCE BONE BX UNI/BIL CANNULATION INC/IMAGING  12/30/2021   IR RADIOLOGIST EVAL & MGMT  12/02/2021   VAGINAL DELIVERY     3   Social History:  reports that she has never smoked. She has never used smokeless tobacco. She reports that she does not drink alcohol and does not use drugs.  No Known Allergies  History reviewed. No pertinent family history.  Prior to Admission medications   Medication Sig Start Date End Date Taking? Authorizing Provider  acetaminophen (TYLENOL) 500 MG tablet Take 2 tablets (1,000 mg total) by mouth every 8 (eight) hours. 12/11/21   Sunnie Nielsen, DO  amLODipine (NORVASC) 5 MG tablet Take 1 tablet (5 mg total) by mouth daily. 12/12/21   Sunnie Nielsen, DO  aspirin EC 325 MG tablet Take 1 tablet (325 mg total) by mouth daily. 12/12/21   Sunnie Nielsen, DO  atorvastatin (LIPITOR) 40 MG tablet Take 40 mg by mouth daily.    [provider]  clopidogrel (PLAVIX) 75 MG tablet Take 75 mg by mouth daily.    [provider]  feeding supplement (ENSURE ENLIVE / ENSURE PLUS) LIQD Take  237 mLs by mouth 2 (two) times daily between meals. 12/06/20   Delfino Lovett, MD  methocarbamol (ROBAXIN) 500 MG tablet Take 1 tablet (500 mg total) by mouth every 6 (six) hours as needed for muscle spasms. 12/11/21   Sunnie Nielsen, DO  Multiple Vitamins-Minerals (MULTIVITAMIN WITH MINERALS) tablet Take 1 tablet by mouth daily.    [provider]  oxyCODONE (OXY IR/ROXICODONE) 5 MG immediate release tablet Take 0.5-1 tablets (2.5-5 mg total) by  mouth every 4 (four) hours as needed for moderate pain (pain score 4-6). 12/09/21   Dedra Skeens, PA-C  sodium phosphate (FLEET) 7-19 GM/118ML ENEM Place 133 mLs (1 enema total) rectally once as needed for severe constipation. 12/11/21   Sunnie Nielsen, DO  traMADol (ULTRAM) 50 MG tablet Take 1 tablet (50 mg total) by mouth every 6 (six) hours as needed for moderate pain. 12/09/21   Dedra Skeens, PA-C    Physical Exam: Vitals:   08/14/22 1200 08/14/22 1220 08/14/22 1230 08/14/22 1300  BP: 139/79 (!) 143/75 (!) 142/71 (!) 157/67  Pulse: 87 85 81 77  Resp: (!) 24 18 (!) 32 12  Temp:  97.9 F (36.6 C)    TempSrc:  Oral    SpO2:  97%    Weight:      Height:       Physical Exam Vitals and nursing note reviewed.  Constitutional:      Appearance: She is cachectic.  HENT:     Head: Normocephalic and atraumatic.     Mouth/Throat:     Mouth: Mucous membranes are moist.     Pharynx: Oropharynx is clear.     Comments: Poor dentition Eyes:     Conjunctiva/sclera: Conjunctivae normal.     Pupils: Pupils are equal, round, and reactive to light.  Cardiovascular:     Rate and Rhythm: Normal rate and regular rhythm.     Pulses:          Dorsalis pedis pulses are 2+ on the right side and 2+ on the left side.     Heart sounds: No murmur heard. Pulmonary:     Effort: Pulmonary effort is normal. No respiratory distress.     Breath sounds: Normal breath sounds.  Abdominal:     Palpations: Abdomen is soft.     Tenderness: There is no abdominal tenderness. There is no guarding.  Musculoskeletal:     Right lower leg: No edema.     Left lower leg: No edema.  Skin:    General: Skin is warm and dry.     Findings: No bruising.  Neurological:     Mental Status: She is alert.     Comments:  Patient is alert and oriented to self and somewhat situation but not time.   Psychiatric:        Mood and Affect: Mood normal.        Behavior: Behavior normal.    Data Reviewed: CBC with WBC of 9.9,  hemoglobin 11.1, MCV of 90, platelets of 155 BMP with sodium of 141, potassium 3.4, bicarb 26, glucose 129, BUN 16, creatinine 0.98 and GFR 54. INR 1.1  EKG personally reviewed.  Sinus tachycardia with a rate of 104 in addition to left bundle branch block.  Borderline first-degree AV block.  Compared to last EKG in October 2023, no changes other than increased rate today.  MR HIP LEFT WO CONTRAST  Result Date: 08/14/2022 CLINICAL DATA:  Left hip and thigh pain after falling last night. Hip fracture  suspected. EXAM: MR OF THE LEFT HIP WITHOUT CONTRAST TECHNIQUE: Multiplanar, multisequence MR imaging was performed. No intravenous contrast was administered. COMPARISON:  Radiographs 08/14/2022 and 12/07/2021.  CT 12/01/2021. FINDINGS: Bones: Acute nondisplaced intertrochanteric left femur fracture. Edema is greatest within the greater trochanter, although extends into the posterior intertrochanteric region and proximal diaphysis. The left femoral head appears normal. No evidence of dislocation or femoral head osteonecrosis. Previous proximal right femoral ORIF. Articular cartilage and labrum Articular cartilage: Mild left hip degenerative changes with mild subchondral cyst formation laterally in the acetabulum. Labrum: Left acetabular labral degeneration. Joint or bursal effusion Joint effusion: No significant hip joint effusion. Bursae: Ill-defined soft tissue hemorrhage posterior and medial to the proximal left femur without focal bursal fluid collection. Muscles and tendons Muscles and tendons: There is hemorrhage within the left gluteus medius and quadratus femoris muscles. No evidence of tendon tear. No significant focal muscular atrophy identified. Other findings Miscellaneous: Mild subcutaneous edema posterolateral to the left hip. Underlying sigmoid diverticulosis noted. IMPRESSION: 1. Acute nondisplaced intertrochanteric left femur fracture. 2. Associated hemorrhage within the left gluteus medius and  quadratus femoris muscles. 3. Mild left hip degenerative changes. Electronically Signed   By: Carey Bullocks M.D.   On: 08/14/2022 11:04   DG Knee 2 Views Left  Result Date: 08/14/2022 CLINICAL DATA:  fall, pain over L hip EXAM: LEFT KNEE - 1-2 VIEW COMPARISON:  None Available. FINDINGS: Osteopenia. No fracture, dislocation, or effusion. No significant osseous degenerative change. Patchy arterial calcifications. IMPRESSION: No acute findings. Osteopenia. Electronically Signed   By: Corlis Leak M.D.   On: 08/14/2022 08:43   CT Head Wo Contrast  Result Date: 08/14/2022 CLINICAL DATA:  Facial trauma, blunt; Neck trauma (Age >= 65y) EXAM: CT HEAD WITHOUT CONTRAST CT CERVICAL SPINE WITHOUT CONTRAST TECHNIQUE: Multidetector CT imaging of the head and cervical spine was performed following the standard protocol without intravenous contrast. Multiplanar CT image reconstructions of the cervical spine were also generated. RADIATION DOSE REDUCTION: This exam was performed according to the departmental dose-optimization program which includes automated exposure control, adjustment of the mA and/or kV according to patient size and/or use of iterative reconstruction technique. COMPARISON:  CT head and C Spine 12/07/21 FINDINGS: CT HEAD FINDINGS Brain: No evidence of acute infarction, hemorrhage, hydrocephalus, extra-axial collection or mass lesion/mass effect. Redemonstrated are chronic bilateral occipital lobe and bilateral cerebellar infarcts. There is sequela of mild-to-moderate chronic microvascular ischemic change. Generalized volume loss. Vascular: No hyperdense vessel or unexpected calcification. Skull: Normal. Negative for fracture or focal lesion. Sinuses/Orbits: No middle ear or mastoid effusion. Mucosal thickening left maxillary sinus. Bilateral lens replacement. Orbits are otherwise unremarkable. Other: None CT CERVICAL SPINE FINDINGS Alignment: Grade 1 anterolisthesis of C2 on C3 and trace retrolisthesis of C3  on C4. Skull base and vertebrae: No acute fracture. No primary bone lesion or focal pathologic process. Soft tissues and spinal canal: No prevertebral fluid or swelling. No visible canal hematoma. Disc levels:  No evidence of high-grade spinal canal stenosis. Upper chest: Negative. Other: None IMPRESSION: 1. No acute intracranial abnormality. Chronic bilateral occipital lobe and bilateral cerebellar infarcts. 2. No acute fracture or traumatic malalignment of the cervical spine. Electronically Signed   By: Lorenza Cambridge M.D.   On: 08/14/2022 08:41   CT Cervical Spine Wo Contrast  Result Date: 08/14/2022 CLINICAL DATA:  Facial trauma, blunt; Neck trauma (Age >= 65y) EXAM: CT HEAD WITHOUT CONTRAST CT CERVICAL SPINE WITHOUT CONTRAST TECHNIQUE: Multidetector CT imaging of the head and cervical spine  was performed following the standard protocol without intravenous contrast. Multiplanar CT image reconstructions of the cervical spine were also generated. RADIATION DOSE REDUCTION: This exam was performed according to the departmental dose-optimization program which includes automated exposure control, adjustment of the mA and/or kV according to patient size and/or use of iterative reconstruction technique. COMPARISON:  CT head and C Spine 12/07/21 FINDINGS: CT HEAD FINDINGS Brain: No evidence of acute infarction, hemorrhage, hydrocephalus, extra-axial collection or mass lesion/mass effect. Redemonstrated are chronic bilateral occipital lobe and bilateral cerebellar infarcts. There is sequela of mild-to-moderate chronic microvascular ischemic change. Generalized volume loss. Vascular: No hyperdense vessel or unexpected calcification. Skull: Normal. Negative for fracture or focal lesion. Sinuses/Orbits: No middle ear or mastoid effusion. Mucosal thickening left maxillary sinus. Bilateral lens replacement. Orbits are otherwise unremarkable. Other: None CT CERVICAL SPINE FINDINGS Alignment: Grade 1 anterolisthesis of C2 on  C3 and trace retrolisthesis of C3 on C4. Skull base and vertebrae: No acute fracture. No primary bone lesion or focal pathologic process. Soft tissues and spinal canal: No prevertebral fluid or swelling. No visible canal hematoma. Disc levels:  No evidence of high-grade spinal canal stenosis. Upper chest: Negative. Other: None IMPRESSION: 1. No acute intracranial abnormality. Chronic bilateral occipital lobe and bilateral cerebellar infarcts. 2. No acute fracture or traumatic malalignment of the cervical spine. Electronically Signed   By: Lorenza Cambridge M.D.   On: 08/14/2022 08:41   DG Hip Unilat W or Wo Pelvis 2-3 Views Left  Result Date: 08/14/2022 CLINICAL DATA:  Fall with left hip/thigh pain EXAM: DG HIP (WITH OR WITHOUT PELVIS) 3V LEFT COMPARISON:  Left hip radiographs dated 12/07/2021 FINDINGS: Prior right femoral fixation hardware appears intact. Subtle cortical irregularity along the proximal aspect of the left greater trochanter. IMPRESSION: Subtle cortical irregularity along the proximal aspect of the left greater trochanter, which may represent a nondisplaced fracture or be artifactual due to patient positioning. Electronically Signed   By: Agustin Cree M.D.   On: 08/14/2022 08:38    There are no new results to review at this time.  Assessment and Plan:  * Closed left hip fracture [facility]) Patient is presenting after ground-level fall with evidence of a left hip fracture.  Plan is for the OR today.  - Orthopedic surgery consulted; appreciate their recommendations - N.p.o. - PT/OT postoperatively per orthopedic surgery - Dilaudid for pain control  Dementia (HCC) Patient has a history of moderate dementia with no behavioral disturbance.  She is currently at baseline.  - Delirium precautions  CAD (coronary artery disease) Patient states she continues to have reproducible chest pain since her CABG in 2009.  No chest pain concerning for active cardiovascular disease.  - Hold home Plavix in  the setting of surgery - Continue home statin  Essential hypertension, benign - Resume home antihypertensives tomorrow  Advance Care Planning:   Code Status: DNR CODE STATUS confirmed with both patient and her daughter at bedside.  Patient to remain DNR/DNI, however she is okay with intubation for procedure only.  If postprocedure she is experiencing any cardiac or pulmonary difficulty, patient would like DNR/DNI to be honored.  Consults: Orthopedic surgery  Family Communication: Patient's daughter updated at bedside  Severity of Illness: The appropriate patient status for this patient is INPATIENT. Inpatient status is judged to be reasonable and necessary in order to provide the required intensity of service to ensure the patient's safety. The patient's presenting symptoms, physical exam findings, and initial radiographic and laboratory data in the context of their  chronic comorbidities is felt to place them at high risk for further clinical deterioration. Furthermore, it is not anticipated that the patient will be medically stable for discharge from the hospital within 2 midnights of admission.   * I certify that at the point of admission it is my clinical judgment that the patient will require inpatient hospital care spanning beyond 2 midnights from the point of admission due to high intensity of service, high risk for further deterioration and high frequency of surveillance required.*  Author: Verdene Lennert, MD 08/14/2022 1:10 PM  For on call review [email or web address].

## 2022-08-14 NOTE — Assessment & Plan Note (Signed)
-   Resume home antihypertensives tomorrow 

## 2022-08-14 NOTE — Op Note (Signed)
08/14/2022  4:14 PM  Patient:   Madison Brown  Pre-Op Diagnosis:   Nondisplaced intertrochanteric fracture, left hip.  Post-Op Diagnosis:   Same  Procedure:   Internal fixation of nondisplaced intertrochanteric left hip fracture with Biomet Affixis TFN nail.  Surgeon:   Maryagnes Amos, MD  Assistant:   None  Anesthesia:   GET  Findings:   As above  Complications:   None  EBL:   50 cc  Fluids:   600 cc crystalloid  UOP:   100 cc  TT:   None  Drains:   None  Closure:   Staples  Implants:   Biomet Affixis 9 x 340 mm TFN with a 95 mm lag screw and a 36 mm distal interlocking screw  Brief Clinical Note:   The patient is a 89 year old female who sustained the above-noted injury last evening when she apparently lost her balance and fell onto her left hip while going to the bathroom without her walker. The patient was brought to the emergency room where x-rays demonstrated a questionable nondisplaced fracture of the greater trochanter. An MRI scan confirmed the presence of a nondisplaced intertrochanteric left hip fracture. The patient has been cleared medically and presents at this time for reduction and internal fixation of the nondisplaced intertrochanteric left hip fracture.  Procedure:   The patient was brought into the operating room and lain in the supine position. After adequate general endotracheal intubation and anesthesia were obtained, the patient was repositioned on the fracture table. The uninjured leg was placed in a flexed and abducted position while the injured lower extremity was placed in gentle longitudinal traction and internal rotation. The adequacy of alignment of the intertrochanteric fracture was verified fluoroscopically in AP and lateral projections and found to be anatomic. The lateral aspects of the left hip and thigh were prepped with ChloraPrep solution before being draped sterilely. Preoperative antibiotics were administered. A timeout was performed to  verify the appropriate surgical site.   The greater trochanter was identified fluoroscopically and an approximately 3 cm incision made about 2-3 fingerbreadths above the tip of the greater trochanter. The incision was carried down through the subcutaneous tissues to expose the gluteal fascia. This was split the length of the incision, providing access to the tip of the trochanter. Under fluoroscopic guidance, a guidewire was drilled through the tip of the trochanter into the proximal metaphysis to the level of the lesser trochanter. After verifying its position fluoroscopically in AP and lateral projections, it was overreamed with the initial reamer to the depth of the lesser trochanter. A guidewire was passed down through the femoral canal to the supracondylar region. The adequacy of guidewire position was verified fluoroscopically in AP and lateral projections before the length of the guidewire within the canal was measured and found to be 355 mm. Therefore, a 340 mm length nail was selected. The guidewire was overreamed sequentially using the flexible reamers, beginning with a 9.5 mm reamer and progressing to a 10.5 mm reamer. This provided good cortical chatter. The 9 x 340 mm Biomet Affixis TFN rod was selected and advanced to the appropriate depth, as verified fluoroscopically.   The guide system for the lag screw was positioned and advanced through an approximately 2 cm stab incision over the lateral aspect of the proximal femur. The guidewire was drilled up through the trochanteric femoral nail and into the femoral neck to rest within 5 mm of subchondral bone. After verifying its position in the femoral neck and  head in both AP and lateral projections, the guidewire was measured and found to be optimally replicated by a 95 mm lag screw. The guidewire was overreamed to the appropriate depth before the lag screw was inserted and advanced to the appropriate depth as verified fluoroscopically in AP and  lateral projections. The locking screw was advanced, then backed off a quarter turn to set the lag screw. Again the adequacy of hardware position and fracture reduction was verified fluoroscopically in AP and lateral projections and found to be excellent.  Attention was directed distally. Using the "perfect circle" technique, the leg and fluoroscopy machine were positioned appropriately. An approximately 1.5 cm stab incision was made over the skin at the appropriate point before the drill bit was advanced through the cortex and across the static hole of the nail. The appropriate length of the screw was determined before the 36 mm distal interlocking screw was positioned, then advanced and tightened securely. Again the adequacy of screw position was verified fluoroscopically in AP and lateral projections and found to be excellent.  The wounds were irrigated thoroughly with sterile saline solution before the abductor fascia was reapproximated using #0 Vicryl interrupted sutures. The subcutaneous tissues were closed using 2-0 Vicryl interrupted sutures. The skin was closed using staples. A total of 20 cc of 0.5% Sensorcaine with epinephrine was injected in and around all incisions. Sterile occlusive dressings were applied to all wounds before the patient was awakened, extubated, and returned to the recovery room in satisfactory condition after tolerating the procedure well.

## 2022-08-14 NOTE — ED Provider Notes (Signed)
[facility] Provider Note    Event Date/Time   First MD Initiated Contact with Patient 08/14/22 [phone]     (approximate)   History   Left hip pain  HPI  Madison Brown is a 89 y.o. female whom on review of prior discharge summary from November of last year has a history of right femur fracture, coronary disease, hip fracture, vertebral fracture, fall, and delirium.  Per the patient's daughter she also has dementia and poor memory no noted anticoagulant use but does take aspirin and Plavix   Patient fell last night at her house.  They were able to get her back into bed.  Daughter reports this morning when he got up to use the walker she seemed to be unable due to pain mostly noted in her left hip area.  There is no noted evidence of head injury.  Her mom is acting normally except she seems like she cannot use a walker due to pain in the left leg.  She previously had surgery they believe on her right hip  Patient reports no headache no neck pain no chest pain no trouble breathing.  No abdominal pain.  Daughter reports patient seems to be acting behaving to her normal mentation, but exception being she cannot get up due to pain in the left hip area.  Patient reports she is not in pain when she sits still but it hurts a lot in her left leg when we try to move it  Physical Exam   Triage Vital Signs: ED Triage Vitals  Enc Vitals Group     BP      Pulse      Resp      Temp      Temp src      SpO2      Weight      Height      Head Circumference      Peak Flow      Pain Score      Pain Loc      Pain Edu?      Excl. in GC?     Most recent vital signs: Vitals:   08/14/22 0915 08/14/22 0930  BP:    Pulse: 86 86  Resp: 16 19  Temp:    SpO2:       General: Awake, no distress.  Very pleasant normocephalic atraumatic no cervical thoracic or lumbar tenderness.  No evidence of trauma on inspection of the chest abdomen pelvis. CV:  Good peripheral perfusion.   Normal tones and rate Resp:  Normal effort.  Clear bilateral Abd:  No distention.  Soft nontender nondistended throughout.  No pain to palpation over the pelvic bones Other:  Right lower extremity atraumatic good range of motion no pain or discomfort through full range of motion all joints.  Left lower extremity patient able to utilize her right foot ankle moves his right knee slightly but any attempts to flex or extend at the left hip causes fairly severe pain she points over her left trochanteric region.  There is no shortening deformity or rotation.  She has strong palpable pulses in the feet bilaterally including dorsalis pedis.  She has normal sensation and motor examination of the lower extremities bilaterally.  Demonstrates normal use of the upper extremities bilaterally without deficit.  No gross injury   ED Results / Procedures / Treatments   Labs (all labs ordered are listed, but only abnormal results are displayed) Labs Reviewed  CBC - Abnormal; Notable for the following components:      Result Value   RBC 3.73 (*)    Hemoglobin 11.1 (*)    HCT 33.6 (*)    All other components within normal limits  BASIC METABOLIC PANEL - Abnormal; Notable for the following components:   Potassium 3.4 (*)    Glucose, Bld 129 (*)    GFR, Estimated 54 (*)    All other components within normal limits  PROTIME-INR     EKG  Interpreted at 8 AM heart rate 100 QRS 130 QTc 530 Sinus rhythm left bundle branch block   RADIOLOGY  MR HIP LEFT WO CONTRAST  Result Date: 08/14/2022 CLINICAL DATA:  Left hip and thigh pain after falling last night. Hip fracture suspected. EXAM: MR OF THE LEFT HIP WITHOUT CONTRAST TECHNIQUE: Multiplanar, multisequence MR imaging was performed. No intravenous contrast was administered. COMPARISON:  Radiographs 08/14/2022 and 12/07/2021.  CT 12/01/2021. FINDINGS: Bones: Acute nondisplaced intertrochanteric left femur fracture. Edema is greatest within the greater  trochanter, although extends into the posterior intertrochanteric region and proximal diaphysis. The left femoral head appears normal. No evidence of dislocation or femoral head osteonecrosis. Previous proximal right femoral ORIF. Articular cartilage and labrum Articular cartilage: Mild left hip degenerative changes with mild subchondral cyst formation laterally in the acetabulum. Labrum: Left acetabular labral degeneration. Joint or bursal effusion Joint effusion: No significant hip joint effusion. Bursae: Ill-defined soft tissue hemorrhage posterior and medial to the proximal left femur without focal bursal fluid collection. Muscles and tendons Muscles and tendons: There is hemorrhage within the left gluteus medius and quadratus femoris muscles. No evidence of tendon tear. No significant focal muscular atrophy identified. Other findings Miscellaneous: Mild subcutaneous edema posterolateral to the left hip. Underlying sigmoid diverticulosis noted. IMPRESSION: 1. Acute nondisplaced intertrochanteric left femur fracture. 2. Associated hemorrhage within the left gluteus medius and quadratus femoris muscles. 3. Mild left hip degenerative changes. Electronically Signed   By: Carey Bullocks M.D.   On: 08/14/2022 11:04   DG Knee 2 Views Left  Result Date: 08/14/2022 CLINICAL DATA:  fall, pain over L hip EXAM: LEFT KNEE - 1-2 VIEW COMPARISON:  None Available. FINDINGS: Osteopenia. No fracture, dislocation, or effusion. No significant osseous degenerative change. Patchy arterial calcifications. IMPRESSION: No acute findings. Osteopenia. Electronically Signed   By: Corlis Leak M.D.   On: 08/14/2022 08:43   CT Head Wo Contrast  Result Date: 08/14/2022 CLINICAL DATA:  Facial trauma, blunt; Neck trauma (Age >= 65y) EXAM: CT HEAD WITHOUT CONTRAST CT CERVICAL SPINE WITHOUT CONTRAST TECHNIQUE: Multidetector CT imaging of the head and cervical spine was performed following the standard protocol without intravenous contrast.  Multiplanar CT image reconstructions of the cervical spine were also generated. RADIATION DOSE REDUCTION: This exam was performed according to the departmental dose-optimization program which includes automated exposure control, adjustment of the mA and/or kV according to patient size and/or use of iterative reconstruction technique. COMPARISON:  CT head and C Spine 12/07/21 FINDINGS: CT HEAD FINDINGS Brain: No evidence of acute infarction, hemorrhage, hydrocephalus, extra-axial collection or mass lesion/mass effect. Redemonstrated are chronic bilateral occipital lobe and bilateral cerebellar infarcts. There is sequela of mild-to-moderate chronic microvascular ischemic change. Generalized volume loss. Vascular: No hyperdense vessel or unexpected calcification. Skull: Normal. Negative for fracture or focal lesion. Sinuses/Orbits: No middle ear or mastoid effusion. Mucosal thickening left maxillary sinus. Bilateral lens replacement. Orbits are otherwise unremarkable. Other: None CT CERVICAL SPINE FINDINGS Alignment: Grade 1 anterolisthesis of C2 on C3  and trace retrolisthesis of C3 on C4. Skull base and vertebrae: No acute fracture. No primary bone lesion or focal pathologic process. Soft tissues and spinal canal: No prevertebral fluid or swelling. No visible canal hematoma. Disc levels:  No evidence of high-grade spinal canal stenosis. Upper chest: Negative. Other: None IMPRESSION: 1. No acute intracranial abnormality. Chronic bilateral occipital lobe and bilateral cerebellar infarcts. 2. No acute fracture or traumatic malalignment of the cervical spine. Electronically Signed   By: Lorenza Cambridge M.D.   On: 08/14/2022 08:41   CT Cervical Spine Wo Contrast  Result Date: 08/14/2022 CLINICAL DATA:  Facial trauma, blunt; Neck trauma (Age >= 65y) EXAM: CT HEAD WITHOUT CONTRAST CT CERVICAL SPINE WITHOUT CONTRAST TECHNIQUE: Multidetector CT imaging of the head and cervical spine was performed following the standard  protocol without intravenous contrast. Multiplanar CT image reconstructions of the cervical spine were also generated. RADIATION DOSE REDUCTION: This exam was performed according to the departmental dose-optimization program which includes automated exposure control, adjustment of the mA and/or kV according to patient size and/or use of iterative reconstruction technique. COMPARISON:  CT head and C Spine 12/07/21 FINDINGS: CT HEAD FINDINGS Brain: No evidence of acute infarction, hemorrhage, hydrocephalus, extra-axial collection or mass lesion/mass effect. Redemonstrated are chronic bilateral occipital lobe and bilateral cerebellar infarcts. There is sequela of mild-to-moderate chronic microvascular ischemic change. Generalized volume loss. Vascular: No hyperdense vessel or unexpected calcification. Skull: Normal. Negative for fracture or focal lesion. Sinuses/Orbits: No middle ear or mastoid effusion. Mucosal thickening left maxillary sinus. Bilateral lens replacement. Orbits are otherwise unremarkable. Other: None CT CERVICAL SPINE FINDINGS Alignment: Grade 1 anterolisthesis of C2 on C3 and trace retrolisthesis of C3 on C4. Skull base and vertebrae: No acute fracture. No primary bone lesion or focal pathologic process. Soft tissues and spinal canal: No prevertebral fluid or swelling. No visible canal hematoma. Disc levels:  No evidence of high-grade spinal canal stenosis. Upper chest: Negative. Other: None IMPRESSION: 1. No acute intracranial abnormality. Chronic bilateral occipital lobe and bilateral cerebellar infarcts. 2. No acute fracture or traumatic malalignment of the cervical spine. Electronically Signed   By: Lorenza Cambridge M.D.   On: 08/14/2022 08:41   DG Hip Unilat W or Wo Pelvis 2-3 Views Left  Result Date: 08/14/2022 CLINICAL DATA:  Fall with left hip/thigh pain EXAM: DG HIP (WITH OR WITHOUT PELVIS) 3V LEFT COMPARISON:  Left hip radiographs dated 12/07/2021 FINDINGS: Prior right femoral fixation  hardware appears intact. Subtle cortical irregularity along the proximal aspect of the left greater trochanter. IMPRESSION: Subtle cortical irregularity along the proximal aspect of the left greater trochanter, which may represent a nondisplaced fracture or be artifactual due to patient positioning. Electronically Signed   By: Agustin Cree M.D.   On: 08/14/2022 08:38      PROCEDURES:  Critical Care performed: No  Procedures   MEDICATIONS ORDERED IN ED: Medications  sodium chloride 0.9 % bolus 500 mL (has no administration in time range)  ceFAZolin (ANCEF) IVPB 2g/100 mL premix (has no administration in time range)  fentaNYL (SUBLIMAZE) injection 12.5 mcg (12.5 mcg Intravenous Given 08/14/22 0805)  acetaminophen (TYLENOL) tablet 650 mg (650 mg Oral Given 08/14/22 0805)     IMPRESSION / MDM / ASSESSMENT AND PLAN / ED COURSE  I reviewed the triage vital signs and the nursing notes.                              Differential  diagnosis includes, but is not limited to, fall.  Evidently was getting up to use the bathroom when she fell she has poor peripheral vision due to previous stroke and has a history of frequent falls.  She has been in her normal state of health.  There is no obvious injury immediately after except reported back in bed this morning she is complaining of left hip pain.  She has focal tenderness over the left hip otherwise reassuring exam.  Given the patient's age and history of dementia imaging of the head cervical spine has been ordered.  Also imaging the left hip.  Small dose of fentanyl for pain control along with Tylenol.  We will check basic labs CBC metabolic panel.  Patient's presentation is most consistent with acute complicated illness / injury requiring diagnostic workup.   Consulted with and discussed case with Dr. Joice Lofts, advises keep patient n.p.o. admit to hospitalist will see in consult this afternoon for further decision as to treatment plan.  Anticipate  possible need for surgery   Patient and family updated the bedside.  Patient reports currently in no pain as long as she is resting in the bed.  She appears alert oriented to self and daughters.  Additionally, family at the bedside updated.  They understand agreeable plan for admission  Consulted and patient accepted to hospitalist by Dr. Huel Cote     FINAL CLINICAL IMPRESSION(S) / ED DIAGNOSES   Final diagnoses:  Closed left hip fracture, initial encounter [facility])     Rx / DC Orders   ED Discharge Orders     None        Note:  This document was prepared using Dragon voice recognition software and may include unintentional dictation errors.   Sharyn Creamer, MD 08/14/22 [phone]

## 2022-08-14 NOTE — Consult Note (Signed)
ORTHOPAEDIC CONSULTATION  REQUESTING PHYSICIAN: Verdene Lennert, MD  Chief Complaint:   Left hip pain.  History of Present Illness: Madison Brown is a 89 y.o. female with multiple medical problems including dementia, hypertension, hyperlipidemia, a CVA, and a history of coronary artery disease for which she underwent a CABG, as well as osteoporosis who is now 9 months status post an IM nailing of a displaced intertrochanteric fracture of her right hip.  The patient normally lives independently with her husband and ambulates with a walker.  The patient was in her usual state of health last evening when she apparently lost her balance and fell onto her left side while going to the bathroom without her walker.  She was brought to the emergency room where plain radiographs were inconclusive for an intertrochanteric left hip fracture.  However, a subsequent MRI scan confirmed the presence of a nondisplaced intertrochanteric fracture of the left hip.  Subsequently, the patient has been admitted for definitive management of this injury.  She denies any associated injury, and denies any lightheadedness, dizziness, chest pain, shortness of breath, or other symptoms which may have contributed to her fall.  Past Medical History:  Diagnosis Date   Coronary artery disease    Dr. Gwen Pounds   Past Surgical History:  Procedure Laterality Date   CORONARY ARTERY BYPASS GRAFT  2009   Dr. Zebedee Iba at [facility] ([facility]) NAIL INTERTROCHANTERIC Right 12/08/2021   Procedure: INTRAMEDULLARY ([facility]) NAIL INTERTROCHANTERIC;  Surgeon: Signa Kell, MD;  Location: [facility] ORS;  Service: Orthopedics;  Laterality: Right;   IR KYPHO LUMBAR INC FX REDUCE BONE BX UNI/BIL CANNULATION INC/IMAGING  12/30/2021   IR RADIOLOGIST EVAL & MGMT  12/02/2021   VAGINAL DELIVERY     3   Social History   Socioeconomic History   Marital status: Married    Spouse name: Not on  file   Number of children: Not on file   Years of education: Not on file   Highest education level: Not on file  Occupational History   Not on file  Tobacco Use   Smoking status: Never   Smokeless tobacco: Never  Substance and Sexual Activity   Alcohol use: No   Drug use: No   Sexual activity: Not on file  Other Topics Concern   Not on file  Social History Narrative   Lives with husband in [place]. No pets.   Work - retired Clinical research associate      Diet - healthy diet   Exercise - walks 2 miles per day   Social Determinants of Corporate investment banker Strain: Not on file  Food Insecurity: No Food Insecurity (08/14/2022)   Hunger Vital Sign    Worried About Running Out of Food in the Last Year: Never true    Ran Out of Food in the Last Year: Never true  Transportation Needs: No Transportation Needs (08/14/2022)   PRAPARE - Administrator, Civil Service (Medical): No    Lack of Transportation (Non-Medical): No  Physical Activity: Not on file  Stress: Not on file  Social Connections: Not on file   History reviewed. No pertinent family history. No Known Allergies Prior to Admission medications   Medication Sig Start Date End Date Taking? Authorizing Provider  acetaminophen (TYLENOL) 500 MG tablet Take 2 tablets (1,000 mg total) by mouth every 8 (eight) hours. 12/11/21  Yes Sunnie Nielsen, DO  amLODipine (NORVASC) 10 MG tablet Take 10 mg by mouth daily. 06/30/22  Yes [provider]  atorvastatin (LIPITOR) 40 MG tablet Take 40 mg by mouth daily.   Yes [provider]  clopidogrel (PLAVIX) 75 MG tablet Take 75 mg by mouth daily.   Yes [provider]  amLODipine (NORVASC) 5 MG tablet Take 1 tablet (5 mg total) by mouth daily. Patient not taking: Reported on 08/14/2022 12/12/21   Sunnie Nielsen, DO  aspirin EC 325 MG tablet Take 1 tablet (325 mg total) by mouth daily. Patient not taking: Reported on 08/14/2022 12/12/21   Sunnie Nielsen, DO   feeding supplement (ENSURE ENLIVE / ENSURE PLUS) LIQD Take 237 mLs by mouth 2 (two) times daily between meals. 12/06/20   Delfino Lovett, MD  methocarbamol (ROBAXIN) 500 MG tablet Take 1 tablet (500 mg total) by mouth every 6 (six) hours as needed for muscle spasms. Patient not taking: Reported on 08/14/2022 12/11/21   Sunnie Nielsen, DO  Multiple Vitamins-Minerals (MULTIVITAMIN WITH MINERALS) tablet Take 1 tablet by mouth daily. Patient not taking: Reported on 08/14/2022    [provider]  oxyCODONE (OXY IR/ROXICODONE) 5 MG immediate release tablet Take 0.5-1 tablets (2.5-5 mg total) by mouth every 4 (four) hours as needed for moderate pain (pain score 4-6). Patient not taking: Reported on 08/14/2022 12/09/21   Dedra Skeens, PA-C  sodium phosphate (FLEET) 7-19 GM/118ML ENEM Place 133 mLs (1 enema total) rectally once as needed for severe constipation. Patient not taking: Reported on 08/14/2022 12/11/21   Sunnie Nielsen, DO  traMADol (ULTRAM) 50 MG tablet Take 1 tablet (50 mg total) by mouth every 6 (six) hours as needed for moderate pain. Patient not taking: Reported on 08/14/2022 12/09/21   Dedra Skeens, PA-C   MR HIP LEFT WO CONTRAST  Result Date: 08/14/2022 CLINICAL DATA:  Left hip and thigh pain after falling last night. Hip fracture suspected. EXAM: MR OF THE LEFT HIP WITHOUT CONTRAST TECHNIQUE: Multiplanar, multisequence MR imaging was performed. No intravenous contrast was administered. COMPARISON:  Radiographs 08/14/2022 and 12/07/2021.  CT 12/01/2021. FINDINGS: Bones: Acute nondisplaced intertrochanteric left femur fracture. Edema is greatest within the greater trochanter, although extends into the posterior intertrochanteric region and proximal diaphysis. The left femoral head appears normal. No evidence of dislocation or femoral head osteonecrosis. Previous proximal right femoral ORIF. Articular cartilage and labrum Articular cartilage: Mild left hip degenerative changes with mild  subchondral cyst formation laterally in the acetabulum. Labrum: Left acetabular labral degeneration. Joint or bursal effusion Joint effusion: No significant hip joint effusion. Bursae: Ill-defined soft tissue hemorrhage posterior and medial to the proximal left femur without focal bursal fluid collection. Muscles and tendons Muscles and tendons: There is hemorrhage within the left gluteus medius and quadratus femoris muscles. No evidence of tendon tear. No significant focal muscular atrophy identified. Other findings Miscellaneous: Mild subcutaneous edema posterolateral to the left hip. Underlying sigmoid diverticulosis noted. IMPRESSION: 1. Acute nondisplaced intertrochanteric left femur fracture. 2. Associated hemorrhage within the left gluteus medius and quadratus femoris muscles. 3. Mild left hip degenerative changes. Electronically Signed   By: Carey Bullocks M.D.   On: 08/14/2022 11:04   DG Knee 2 Views Left  Result Date: 08/14/2022 CLINICAL DATA:  fall, pain over L hip EXAM: LEFT KNEE - 1-2 VIEW COMPARISON:  None Available. FINDINGS: Osteopenia. No fracture, dislocation, or effusion. No significant osseous degenerative change. Patchy arterial calcifications. IMPRESSION: No acute findings. Osteopenia. Electronically Signed   By: Corlis Leak M.D.   On: 08/14/2022 08:43   CT Head Wo Contrast  Result Date: 08/14/2022 CLINICAL DATA:  Facial trauma, blunt; Neck trauma (Age >= 65y) EXAM: CT HEAD WITHOUT CONTRAST CT CERVICAL SPINE WITHOUT CONTRAST TECHNIQUE: Multidetector CT imaging of the head and cervical spine was performed following the standard protocol without intravenous contrast. Multiplanar CT image reconstructions of the cervical spine were also generated. RADIATION DOSE REDUCTION: This exam was performed according to the departmental dose-optimization program which includes automated exposure control, adjustment of the mA and/or kV according to patient size and/or use of iterative reconstruction  technique. COMPARISON:  CT head and C Spine 12/07/21 FINDINGS: CT HEAD FINDINGS Brain: No evidence of acute infarction, hemorrhage, hydrocephalus, extra-axial collection or mass lesion/mass effect. Redemonstrated are chronic bilateral occipital lobe and bilateral cerebellar infarcts. There is sequela of mild-to-moderate chronic microvascular ischemic change. Generalized volume loss. Vascular: No hyperdense vessel or unexpected calcification. Skull: Normal. Negative for fracture or focal lesion. Sinuses/Orbits: No middle ear or mastoid effusion. Mucosal thickening left maxillary sinus. Bilateral lens replacement. Orbits are otherwise unremarkable. Other: None CT CERVICAL SPINE FINDINGS Alignment: Grade 1 anterolisthesis of C2 on C3 and trace retrolisthesis of C3 on C4. Skull base and vertebrae: No acute fracture. No primary bone lesion or focal pathologic process. Soft tissues and spinal canal: No prevertebral fluid or swelling. No visible canal hematoma. Disc levels:  No evidence of high-grade spinal canal stenosis. Upper chest: Negative. Other: None IMPRESSION: 1. No acute intracranial abnormality. Chronic bilateral occipital lobe and bilateral cerebellar infarcts. 2. No acute fracture or traumatic malalignment of the cervical spine. Electronically Signed   By: Lorenza Cambridge M.D.   On: 08/14/2022 08:41   CT Cervical Spine Wo Contrast  Result Date: 08/14/2022 CLINICAL DATA:  Facial trauma, blunt; Neck trauma (Age >= 65y) EXAM: CT HEAD WITHOUT CONTRAST CT CERVICAL SPINE WITHOUT CONTRAST TECHNIQUE: Multidetector CT imaging of the head and cervical spine was performed following the standard protocol without intravenous contrast. Multiplanar CT image reconstructions of the cervical spine were also generated. RADIATION DOSE REDUCTION: This exam was performed according to the departmental dose-optimization program which includes automated exposure control, adjustment of the mA and/or kV according to patient size and/or  use of iterative reconstruction technique. COMPARISON:  CT head and C Spine 12/07/21 FINDINGS: CT HEAD FINDINGS Brain: No evidence of acute infarction, hemorrhage, hydrocephalus, extra-axial collection or mass lesion/mass effect. Redemonstrated are chronic bilateral occipital lobe and bilateral cerebellar infarcts. There is sequela of mild-to-moderate chronic microvascular ischemic change. Generalized volume loss. Vascular: No hyperdense vessel or unexpected calcification. Skull: Normal. Negative for fracture or focal lesion. Sinuses/Orbits: No middle ear or mastoid effusion. Mucosal thickening left maxillary sinus. Bilateral lens replacement. Orbits are otherwise unremarkable. Other: None CT CERVICAL SPINE FINDINGS Alignment: Grade 1 anterolisthesis of C2 on C3 and trace retrolisthesis of C3 on C4. Skull base and vertebrae: No acute fracture. No primary bone lesion or focal pathologic process. Soft tissues and spinal canal: No prevertebral fluid or swelling. No visible canal hematoma. Disc levels:  No evidence of high-grade spinal canal stenosis. Upper chest: Negative. Other: None IMPRESSION: 1. No acute intracranial abnormality. Chronic bilateral occipital lobe and bilateral cerebellar infarcts. 2. No acute fracture or traumatic malalignment of the cervical spine. Electronically Signed   By: Lorenza Cambridge M.D.   On: 08/14/2022 08:41   DG Hip Unilat W or Wo Pelvis 2-3 Views Left  Result Date: 08/14/2022 CLINICAL DATA:  Fall with left hip/thigh pain EXAM: DG HIP (WITH OR WITHOUT PELVIS) 3V LEFT COMPARISON:  Left hip radiographs dated 12/07/2021 FINDINGS: Prior right femoral fixation hardware appears intact.  Subtle cortical irregularity along the proximal aspect of the left greater trochanter. IMPRESSION: Subtle cortical irregularity along the proximal aspect of the left greater trochanter, which may represent a nondisplaced fracture or be artifactual due to patient positioning. Electronically Signed   By: Agustin Cree M.D.   On: 08/14/2022 08:38    Positive ROS: All other systems have been reviewed and were otherwise negative with the exception of those mentioned in the HPI and as above.  Physical Exam: General:  Alert, no acute distress Psychiatric:  Patient is not competent for consent with normal mood and affect   Cardiovascular:  No pedal edema Respiratory:  No wheezing, non-labored breathing GI:  Abdomen is soft and non-tender Skin:  No lesions in the area of chief complaint Neurologic:  Sensation intact distally Lymphatic:  No axillary or cervical lymphadenopathy  Orthopedic Exam:  Orthopedic examination is limited to the left hip and lower extremity.  The left lower extremity appears to be symmetrically aligned as compared to the right.  Skin inspection around the left hip is unremarkable.  No swelling, erythema, ecchymosis, abrasions, or other skin abnormalities are identified.  She has mild to moderate tenderness palpation over the lateral aspect of the left hip.  She has more severe pain with any attempted active or passive motion of the hip.  She is grossly neurovascularly intact to the left lower extremity and foot.  X-rays:  X-rays and an MRI scan of the pelvis and left hip are available for review and have been reviewed by myself.  The findings are as described above.  Assessment: Nondisplaced intertrochanteric fracture of left hip.  Plan: The treatment options, including both surgical and nonsurgical choices, have been discussed in detail with the patient and her family.  The patient and her family would like to proceed with surgical intervention to include an intramedullary nailing of the nondisplaced intertrochanteric fracture of her left hip.  The risks (including bleeding, infection, nerve and/or blood vessel injury, persistent or recurrent pain, loosening or failure of the components, malunion and/or nonunion, development of arthritis, need for further surgery, blood clots, strokes,  heart attacks or arrhythmias, pneumonia, etc.) and benefits of the surgical procedure were discussed.  The patient and her family state their understanding and agree to proceed.  A formal written consent will be obtained by the nursing staff.  Thank you for asking me to participate in the care of this most pleasant and unfortunate woman.  I will be happy to follow her with you.   Maryagnes Amos, MD  Beeper #:  [phone]  08/14/2022 2:33 PM

## 2022-08-14 NOTE — Anesthesia Procedure Notes (Signed)
Procedure Name: Intubation Date/Time: 08/14/2022 2:40 PM  Performed by: Malva Cogan, CRNAPre-anesthesia Checklist: Patient identified, Patient being monitored, Timeout performed, Emergency Drugs available and Suction available Patient Re-evaluated:Patient Re-evaluated prior to induction Oxygen Delivery Method: Circle system utilized Preoxygenation: Pre-oxygenation with 100% oxygen Induction Type: IV induction Ventilation: Mask ventilation without difficulty Laryngoscope Size: 3 and McGraph Grade View: Grade I Tube type: Oral Tube size: 6.5 mm Number of attempts: 1 Airway Equipment and Method: Stylet and Video-laryngoscopy Placement Confirmation: ETT inserted through vocal cords under direct vision, positive ETCO2 and breath sounds checked- equal and bilateral Secured at: 21 cm Tube secured with: Tape Dental Injury: Teeth and Oropharynx as per pre-operative assessment

## 2022-08-15 DIAGNOSIS — F03B0 Unspecified dementia, moderate, without behavioral disturbance, psychotic disturbance, mood disturbance, and anxiety: Secondary | ICD-10-CM | POA: Diagnosis not present

## 2022-08-15 DIAGNOSIS — S72002A Fracture of unspecified part of neck of left femur, initial encounter for closed fracture: Secondary | ICD-10-CM | POA: Diagnosis not present

## 2022-08-15 DIAGNOSIS — I2510 Atherosclerotic heart disease of native coronary artery without angina pectoris: Secondary | ICD-10-CM | POA: Diagnosis not present

## 2022-08-15 DIAGNOSIS — I10 Essential (primary) hypertension: Secondary | ICD-10-CM | POA: Diagnosis not present

## 2022-08-15 LAB — CBC
HCT: 30.1 % — ABNORMAL LOW (ref 36.0–46.0)
Hemoglobin: 9.7 g/dL — ABNORMAL LOW (ref 12.0–15.0)
MCH: 29.5 pg (ref 26.0–34.0)
MCHC: 32.2 g/dL (ref 30.0–36.0)
MCV: 91.5 fL (ref 80.0–100.0)
Platelets: 134 10*3/uL — ABNORMAL LOW (ref 150–400)
RBC: 3.29 MIL/uL — ABNORMAL LOW (ref 3.87–5.11)
RDW: 12.6 % (ref 11.5–15.5)
WBC: 10.7 10*3/uL — ABNORMAL HIGH (ref 4.0–10.5)
nRBC: 0 % (ref 0.0–0.2)

## 2022-08-15 LAB — BASIC METABOLIC PANEL
Anion gap: 8 (ref 5–15)
BUN: 14 mg/dL (ref 8–23)
CO2: 26 mmol/L (ref 22–32)
Calcium: 8.5 mg/dL — ABNORMAL LOW (ref 8.9–10.3)
Chloride: 104 mmol/L (ref 98–111)
Creatinine, Ser: 1.02 mg/dL — ABNORMAL HIGH (ref 0.44–1.00)
GFR, Estimated: 52 mL/min — ABNORMAL LOW (ref >60)
Glucose, Bld: 164 mg/dL — ABNORMAL HIGH (ref 70–99)
Potassium: 3.7 mmol/L (ref 3.5–5.1)
Sodium: 138 mmol/L (ref 135–145)

## 2022-08-15 MED ORDER — CLOPIDOGREL BISULFATE 75 MG PO TABS
75.0000 mg | ORAL_TABLET | Freq: Every day | ORAL | Status: DC
  Administered 2022-08-15 – 2022-08-18 (×4): 75 mg via ORAL
  Filled 2022-08-15 (×4): qty 1

## 2022-08-15 NOTE — Evaluation (Signed)
Physical Therapy Evaluation Patient Details Name: Madison Brown MRN: [id number] DOB: 1929/11/21 Today's Date: 08/15/2022  History of Present Illness  89 y/o female presented to ED on 08/14/22 after fall with L hip/thigh pain. Imaging showed L greater trochanter fx along with hemorrhage within L glute med and quadratus femoris muscles. S/p ORIF L hip fx on 7/5. PMH: dementia, HTN, CVA, CAD s/p CABG, compression fx s/p kyphoplasty  Clinical Impression  Patient admitted with the above. PTA, patient lives with husband who provides assistance/supervision for patient. Daughter assists with bathing and has aide 2x/week. Typically uses Rw for mobility and reports 2 falls in past 6 months. Patient presents with weakness, impaired balance, decreased activity tolerance, and pain. Patient requires minA+2 for sit to stand and step pivot transfer with RW. Limited by pain this date and sits prematurely in recliner requiring assistance to scoot back into chair. Patient will benefit from skilled PT services during acute stay to address listed deficits. Patient will benefit from ongoing therapy at discharge to maximize functional independence and safety.         Assistance Recommended at Discharge Frequent or constant Supervision/Assistance  If plan is discharge home, recommend the following:  Can travel by private vehicle  A lot of help with walking and/or transfers;A little help with bathing/dressing/bathroom;Assistance with cooking/housework;Assist for transportation;Help with stairs or ramp for entrance   No    Equipment Recommendations Other (comment) (TBD at next venue of care)  Recommendations for Other Services       Functional Status Assessment Patient has had a recent decline in their functional status and demonstrates the ability to make significant improvements in function in a reasonable and predictable amount of time.     Precautions / Restrictions Precautions Precautions:  Fall Restrictions Weight Bearing Restrictions: Yes LLE Weight Bearing: Weight bearing as tolerated      Mobility  Bed Mobility Overal bed mobility: Needs Assistance Bed Mobility: Supine to Sit     Supine to sit: Min assist     General bed mobility comments: assist for repositioning hips towards EOB    Transfers Overall transfer level: Needs assistance Equipment used: Rolling Madison Brown (2 wheels) Transfers: Sit to/from Stand, Bed to chair/wheelchair/BSC Sit to Stand: Min assist, +2 physical assistance   Step pivot transfers: Min assist, +2 physical assistance       General transfer comment: assist to boost up into standing. Cues for hand placement. Able to take steps towards recliner but prematurely sits requiring assist to land buttocks into chair and scoot back from edge of chair    Ambulation/Gait                  Stairs            Wheelchair Mobility     Tilt Bed    Modified Rankin (Stroke Patients Only)       Balance Overall balance assessment: Needs assistance Sitting-balance support: No upper extremity supported, Feet supported Sitting balance-Madison Brown: Fair     Standing balance support: Bilateral upper extremity supported, Reliant on assistive device for balance Standing balance-Madison Brown: Poor                               Pertinent Vitals/Pain Pain Assessment Pain Assessment: Faces Faces Pain Brown: Hurts little more Pain Location: L hip Pain Descriptors / Indicators: Grimacing, Guarding Pain Intervention(s): Monitored during session, Repositioned    Home Living Family/patient expects to  be discharged to:: Private residence Living Arrangements: Spouse/significant other Available Help at Discharge: Family;Available PRN/intermittently Type of Home: House Home Access: Stairs to enter Entrance Stairs-Rails: Left Entrance Stairs-Number of Steps: 5   Home Layout: One level Home Equipment: BSC/3in1;Rolling Crystian Frith  (2 wheels);Cane - single point;Shower seat Additional Comments: has aide 2x/week    Prior Function Prior Level of Function : Needs assist       Physical Assist : Mobility (physical);ADLs (physical) Mobility (physical): Transfers ADLs (physical): Bathing;Dressing Mobility Comments: SBA/CGA with amb community distances with a RW, reports 2 falls in the last 6 months ADLs Comments: Assist from family with bathing, Ind with dressing     Hand Dominance   Dominant Hand: Right    Extremity/Trunk Assessment   Upper Extremity Assessment Upper Extremity Assessment: Overall WFL for tasks assessed    Lower Extremity Assessment Lower Extremity Assessment: Defer to PT evaluation LLE Deficits / Details: grossly 3-/5 LLE: Unable to fully assess due to pain    Cervical / Trunk Assessment Cervical / Trunk Assessment: Kyphotic  Communication   Communication: No difficulties  Cognition Arousal/Alertness: Awake/alert Behavior During Therapy: WFL for tasks assessed/performed Overall Cognitive Status: History of cognitive impairments - at baseline                                 General Comments: very pleasant        General Comments      Exercises     Assessment/Plan    PT Assessment Patient needs continued PT services  PT Problem List Decreased strength;Decreased activity tolerance;Decreased balance;Decreased mobility;Decreased knowledge of use of DME;Decreased safety awareness;Decreased knowledge of precautions;Pain       PT Treatment Interventions DME instruction;Gait training;Functional mobility training;Therapeutic activities;Therapeutic exercise;Balance training;Patient/family education    PT Goals (Current goals can be found in the Care Plan section)  Acute Rehab PT Goals Patient Stated Goal: to go home PT Goal Formulation: With patient/family Time For Goal Achievement: 08/29/22 Potential to Achieve Goals: Fair    Frequency 7X/week      Co-evaluation   Reason for Co-Treatment: Complexity of the patient's impairments (multi-system involvement);For patient/therapist safety;To address functional/ADL transfers   OT goals addressed during session: Proper use of Adaptive equipment and DME;Strengthening/ROM       AM-PAC PT "6 Clicks" Mobility  Outcome Measure Help needed turning from your back to your side while in a flat bed without using bedrails?: A Little Help needed moving from lying on your back to sitting on the side of a flat bed without using bedrails?: A Little Help needed moving to and from a bed to a chair (including a wheelchair)?: Total Help needed standing up from a chair using your arms (e.g., wheelchair or bedside chair)?: Total Help needed to walk in hospital room?: Total Help needed climbing 3-5 steps with a railing? : Total 6 Click Score: 10    End of Session   Activity Tolerance: Patient tolerated treatment well Patient left: in chair;with call bell/phone within reach;with chair alarm set;with family/visitor present Nurse Communication: Mobility status PT Visit Diagnosis: Unsteadiness on feet (R26.81);Muscle weakness (generalized) (M62.81);History of falling (Z91.81);Difficulty in walking, not elsewhere classified (R26.2)    Time: 1610-9604 PT Time Calculation (min) (ACUTE ONLY): 24 min   Charges:   PT Evaluation $PT Eval Moderate Complexity: 1 Mod   PT General Charges $$ ACUTE PT VISIT: 1 Visit         Maylon Peppers,  PT, DPT Physical Therapist - [facility]  [facility]   Madison Brown 08/15/2022, 11:28 AM

## 2022-08-15 NOTE — Plan of Care (Signed)
  Problem: Activity: Goal: Ability to avoid complications of mobility impairment will improve Outcome: Progressing Goal: Ability to tolerate increased activity will improve Outcome: Progressing   Problem: Clinical Measurements: Goal: Postoperative complications will be avoided or minimized Outcome: Progressing   Problem: Pain Management: Goal: Pain level will decrease with appropriate interventions Outcome: Progressing   Problem: Skin Integrity: Goal: Will show signs of wound healing Outcome: Progressing   

## 2022-08-15 NOTE — Progress Notes (Signed)
  Subjective: 1 Day Post-Op Procedure(s) (LRB): INTRAMEDULLARY (IM) NAIL INTERTROCHANTERIC (Left) Patient reports pain as mild.   Patient is well, and has had no acute complaints or problems PT and care management to assist with discharge planning.  Patient currently lives at home with her husband. Negative for chest pain and shortness of breath Fever: no Gastrointestinal:Negative for nausea and vomiting She reports that she is passing gas this morning.  Objective: Vital signs in last 24 hours: Temp:  [97.8 F (36.6 C)-98.6 F (37 C)] 98.2 F (36.8 C) (07/06 0448) Pulse Rate:  [77-94] 85 (07/06 0448) Resp:  [12-32] 18 (07/06 0448) BP: (129-166)/(58-100) 133/71 (07/06 0448) SpO2:  [86 %-100 %] 95 % (07/06 0448) Weight:  [39.9 kg] 39.9 kg (07/05 1416)  Intake/Output from previous day:  Intake/Output Summary (Last 24 hours) at 08/15/2022 0800 Last data filed at 08/15/2022 0500 Gross per 24 hour  Intake 709.68 ml  Output 1075 ml  Net -365.32 ml    Intake/Output this shift: No intake/output data recorded.  Labs: Recent Labs    08/14/22 0812 08/15/22 0415  HGB 11.1* 9.7*   Recent Labs    08/14/22 0812 08/15/22 0415  WBC 9.9 10.7*  RBC 3.73* 3.29*  HCT 33.6* 30.1*  PLT 155 134*   Recent Labs    08/14/22 0812 08/15/22 0415  NA 141 138  K 3.4* 3.7  CL 106 104  CO2 26 26  BUN 16 14  CREATININE 0.98 1.02*  GLUCOSE 129* 164*  CALCIUM 9.1 8.5*   Recent Labs    08/14/22 0812  INR 1.1     EXAM General - Patient is Alert and Appropriate Extremity - ABD soft Neurovascular intact Dorsiflexion/Plantar flexion intact Incision: dressing C/D/I No cellulitis present Compartment soft Dressing/Incision - clean, dry, no drainage noted to the left hip honeycomb dressings Motor Function - intact, moving foot and toes well on exam.  Abdomen soft with intact bowel sounds this morning.  Past Medical History:  Diagnosis Date   Coronary artery disease    Dr. Gwen Pounds     Assessment/Plan: 1 Day Post-Op Procedure(s) (LRB): INTRAMEDULLARY (IM) NAIL INTERTROCHANTERIC (Left) Principal Problem:   Closed left hip fracture (HCC) Active Problems:   CAD (coronary artery disease)   Essential hypertension, benign   Dementia (HCC)  Estimated body mass index is 16.09 kg/m as calculated from the following:   Height as of this encounter: 5\' 2"  (1.575 m).   Weight as of this encounter: 39.9 kg. Advance diet Up with therapy D/C IV fluids when tolerating po intake.  Labs and vitals reviewed this AM. Hg 9.7 this morning. Work on [employer]. Up with PT, she may need SNF for period of time following surgery.   DVT Prophylaxis - Lovenox, TED hose, and SCDs Weight-Bearing as tolerated to left leg  J. Horris Latino, PA-C [facility] Orthopaedic Surgery 08/15/2022, 8:00 AM

## 2022-08-15 NOTE — Progress Notes (Signed)
PROGRESS NOTE    Madison Brown  UEA:[id number] DOB: June 06, 1929 DOA: 08/14/2022 PCP: Enid Baas, MD (Confirm with patient/family/NH records and if not entered, this HAS to be entered at [facility] point of entry. "No PCP" if truly none.)   Brief Narrative: (Start on day 1 of progress note - keep it brief and live)  89 y.o. female with medical history significant of CAD s/p CABG (2009), compression fracture s/p kyphoplasty, moderate dementia, hypertension, CVA, carotid artery disease, hyperlipidemia admitted for closed left hip fracture status post fall  7/5: S/p intramedullary nail for nondisplaced intertrochanteric fracture of the left hip 7/6: PT and OT eval   Assessment & Plan:   Principal Problem:   Closed left hip fracture (HCC) Active Problems:   Dementia (HCC)   CAD (coronary artery disease)   Essential hypertension, benign  Nondisplaced intertrochanteric fracture of the left hip POD 1 status post intramedullary nail Pain management and DVT prophylaxis per Ortho Weightbearing as tolerated to the left leg PT and OT eval  Dementia (HCC) Patient has a history of moderate dementia with no behavioral disturbance.  She is currently at baseline. - Delirium precautions   CAD (coronary artery disease) Patient states she continues to have reproducible chest pain since her CABG in 2009.  No chest pain concerning for active cardiovascular disease. -Resume Plavix - Continue home statin   Essential hypertension, benign - Continue amlodipine  DVT prophylaxis: Lovenox enoxaparin (LOVENOX) injection 40 mg Start: 08/15/22 0800 SCDs Start: 08/14/22 1737 Place TED hose Start: 08/14/22 1737 SCDs Start: 08/14/22 1300     Code Status: DNR Family Communication: Daughter updated at bedside Disposition Plan: Possible discharge in 2 to 3 days depending on clinical condition and progress.  Await PT OT evaluation.  Likely will need SNF placement at discharge   Consultants:   Ortho  Procedures: (Don't include imaging studies which can be auto populated. Include things that cannot be auto populated i.e. Echo, Carotid and venous dopplers, Foley, Bipap, HD, tubes/drains, wound vac, central lines etc) Intra medullary nail placement on 7/5  Antimicrobials: (specify start and planned stop date. Auto populated tables are space occupying and do not give end dates) One-time dose of IV Ancef given for surgical prophylaxis on 7/5   Subjective:  She is feeling better.  No new complaints.  Daughter at bedside  Objective: Vitals:   08/14/22 2019 08/14/22 2350 08/15/22 0448 08/15/22 0816  BP: 136/73 (!) 134/92 133/71 (!) 152/69  Pulse: 84 88 85 78  Resp: 18 18 18 18   Temp: 98.2 F (36.8 C) 97.8 F (36.6 C) 98.2 F (36.8 C) (!) 97.4 F (36.3 C)  TempSrc:      SpO2: 94% 96% 95% 94%  Weight:      Height:        Intake/Output Summary (Last 24 hours) at 08/15/2022 1102 Last data filed at 08/15/2022 1019 Gross per 24 hour  Intake 949.68 ml  Output 1075 ml  Net -125.32 ml   Filed Weights   08/14/22 0756 08/14/22 1416  Weight: 39.9 kg 39.9 kg    Examination:  General exam: Appears calm and comfortable  Respiratory system: Clear to auscultation. Respiratory effort normal. Cardiovascular system: S1 & S2 heard, RRR. No JVD, murmurs, rubs, gallops or clicks. No pedal edema. Gastrointestinal system: Abdomen is nondistended, soft and nontender. No organomegaly or masses felt. Normal bowel sounds heard. Central nervous system: Alert and oriented. No focal neurological deficits. Extremities/skin: Dressing intact.  Moving toes.  No drainage noted on  the left hip honeycomb dressing Psychiatry: Judgement and insight appear normal. Mood & affect appropriate.     Data Reviewed: I have personally reviewed following labs and imaging studies  CBC: Recent Labs  Lab 08/14/22 0812 08/15/22 0415  WBC 9.9 10.7*  HGB 11.1* 9.7*  HCT 33.6* 30.1*  MCV 90.1 91.5  PLT 155  134*   Basic Metabolic Panel: Recent Labs  Lab 08/14/22 0812 08/15/22 0415  NA 141 138  K 3.4* 3.7  CL 106 104  CO2 26 26  GLUCOSE 129* 164*  BUN 16 14  CREATININE 0.98 1.02*  CALCIUM 9.1 8.5*    Recent Labs  Lab 08/14/22 0812  INR 1.1    Radiology Studies: DG HIP UNILAT WITH PELVIS 2-3 VIEWS LEFT  Result Date: 08/14/2022 CLINICAL DATA:  Elective surgery. Left femoral ORIF. Intraoperative fluoroscopy. EXAM: DG HIP (WITH OR WITHOUT PELVIS) 2-3V LEFT COMPARISON:  Left hip radiographs 08/14/2022 and MRI left hip 08/14/2022 FINDINGS: Images were performed intraoperatively without the presence of a radiologist. Interval left femoral long cephalomedullary nail fixation of the previously seen distal intertrochanteric fracture. Total fluoroscopy images: 4 Total fluoroscopy time: 56 seconds Please see intraoperative findings for further detail. IMPRESSION: Intraoperative fluoroscopy for left femoral ORIF. Electronically Signed   By: Neita Garnet M.D.   On: 08/14/2022 16:52   DG C-Arm 1-60 Min-No Report  Result Date: 08/14/2022 Fluoroscopy was utilized by the requesting physician.  No radiographic interpretation.   MR HIP LEFT WO CONTRAST  Result Date: 08/14/2022 CLINICAL DATA:  Left hip and thigh pain after falling last night. Hip fracture suspected. EXAM: MR OF THE LEFT HIP WITHOUT CONTRAST TECHNIQUE: Multiplanar, multisequence MR imaging was performed. No intravenous contrast was administered. COMPARISON:  Radiographs 08/14/2022 and 12/07/2021.  CT 12/01/2021. FINDINGS: Bones: Acute nondisplaced intertrochanteric left femur fracture. Edema is greatest within the greater trochanter, although extends into the posterior intertrochanteric region and proximal diaphysis. The left femoral head appears normal. No evidence of dislocation or femoral head osteonecrosis. Previous proximal right femoral ORIF. Articular cartilage and labrum Articular cartilage: Mild left hip degenerative changes with mild  subchondral cyst formation laterally in the acetabulum. Labrum: Left acetabular labral degeneration. Joint or bursal effusion Joint effusion: No significant hip joint effusion. Bursae: Ill-defined soft tissue hemorrhage posterior and medial to the proximal left femur without focal bursal fluid collection. Muscles and tendons Muscles and tendons: There is hemorrhage within the left gluteus medius and quadratus femoris muscles. No evidence of tendon tear. No significant focal muscular atrophy identified. Other findings Miscellaneous: Mild subcutaneous edema posterolateral to the left hip. Underlying sigmoid diverticulosis noted. IMPRESSION: 1. Acute nondisplaced intertrochanteric left femur fracture. 2. Associated hemorrhage within the left gluteus medius and quadratus femoris muscles. 3. Mild left hip degenerative changes. Electronically Signed   By: Carey Bullocks M.D.   On: 08/14/2022 11:04   DG Knee 2 Views Left  Result Date: 08/14/2022 CLINICAL DATA:  fall, pain over L hip EXAM: LEFT KNEE - 1-2 VIEW COMPARISON:  None Available. FINDINGS: Osteopenia. No fracture, dislocation, or effusion. No significant osseous degenerative change. Patchy arterial calcifications. IMPRESSION: No acute findings. Osteopenia. Electronically Signed   By: Corlis Leak M.D.   On: 08/14/2022 08:43   CT Head Wo Contrast  Result Date: 08/14/2022 CLINICAL DATA:  Facial trauma, blunt; Neck trauma (Age >= 65y) EXAM: CT HEAD WITHOUT CONTRAST CT CERVICAL SPINE WITHOUT CONTRAST TECHNIQUE: Multidetector CT imaging of the head and cervical spine was performed following the standard protocol without intravenous contrast.  Multiplanar CT image reconstructions of the cervical spine were also generated. RADIATION DOSE REDUCTION: This exam was performed according to the departmental dose-optimization program which includes automated exposure control, adjustment of the mA and/or kV according to patient size and/or use of iterative reconstruction  technique. COMPARISON:  CT head and C Spine 12/07/21 FINDINGS: CT HEAD FINDINGS Brain: No evidence of acute infarction, hemorrhage, hydrocephalus, extra-axial collection or mass lesion/mass effect. Redemonstrated are chronic bilateral occipital lobe and bilateral cerebellar infarcts. There is sequela of mild-to-moderate chronic microvascular ischemic change. Generalized volume loss. Vascular: No hyperdense vessel or unexpected calcification. Skull: Normal. Negative for fracture or focal lesion. Sinuses/Orbits: No middle ear or mastoid effusion. Mucosal thickening left maxillary sinus. Bilateral lens replacement. Orbits are otherwise unremarkable. Other: None CT CERVICAL SPINE FINDINGS Alignment: Grade 1 anterolisthesis of C2 on C3 and trace retrolisthesis of C3 on C4. Skull base and vertebrae: No acute fracture. No primary bone lesion or focal pathologic process. Soft tissues and spinal canal: No prevertebral fluid or swelling. No visible canal hematoma. Disc levels:  No evidence of high-grade spinal canal stenosis. Upper chest: Negative. Other: None IMPRESSION: 1. No acute intracranial abnormality. Chronic bilateral occipital lobe and bilateral cerebellar infarcts. 2. No acute fracture or traumatic malalignment of the cervical spine. Electronically Signed   By: Lorenza Cambridge M.D.   On: 08/14/2022 08:41   CT Cervical Spine Wo Contrast  Result Date: 08/14/2022 CLINICAL DATA:  Facial trauma, blunt; Neck trauma (Age >= 65y) EXAM: CT HEAD WITHOUT CONTRAST CT CERVICAL SPINE WITHOUT CONTRAST TECHNIQUE: Multidetector CT imaging of the head and cervical spine was performed following the standard protocol without intravenous contrast. Multiplanar CT image reconstructions of the cervical spine were also generated. RADIATION DOSE REDUCTION: This exam was performed according to the departmental dose-optimization program which includes automated exposure control, adjustment of the mA and/or kV according to patient size and/or  use of iterative reconstruction technique. COMPARISON:  CT head and C Spine 12/07/21 FINDINGS: CT HEAD FINDINGS Brain: No evidence of acute infarction, hemorrhage, hydrocephalus, extra-axial collection or mass lesion/mass effect. Redemonstrated are chronic bilateral occipital lobe and bilateral cerebellar infarcts. There is sequela of mild-to-moderate chronic microvascular ischemic change. Generalized volume loss. Vascular: No hyperdense vessel or unexpected calcification. Skull: Normal. Negative for fracture or focal lesion. Sinuses/Orbits: No middle ear or mastoid effusion. Mucosal thickening left maxillary sinus. Bilateral lens replacement. Orbits are otherwise unremarkable. Other: None CT CERVICAL SPINE FINDINGS Alignment: Grade 1 anterolisthesis of C2 on C3 and trace retrolisthesis of C3 on C4. Skull base and vertebrae: No acute fracture. No primary bone lesion or focal pathologic process. Soft tissues and spinal canal: No prevertebral fluid or swelling. No visible canal hematoma. Disc levels:  No evidence of high-grade spinal canal stenosis. Upper chest: Negative. Other: None IMPRESSION: 1. No acute intracranial abnormality. Chronic bilateral occipital lobe and bilateral cerebellar infarcts. 2. No acute fracture or traumatic malalignment of the cervical spine. Electronically Signed   By: Lorenza Cambridge M.D.   On: 08/14/2022 08:41   DG Hip Unilat W or Wo Pelvis 2-3 Views Left  Result Date: 08/14/2022 CLINICAL DATA:  Fall with left hip/thigh pain EXAM: DG HIP (WITH OR WITHOUT PELVIS) 3V LEFT COMPARISON:  Left hip radiographs dated 12/07/2021 FINDINGS: Prior right femoral fixation hardware appears intact. Subtle cortical irregularity along the proximal aspect of the left greater trochanter. IMPRESSION: Subtle cortical irregularity along the proximal aspect of the left greater trochanter, which may represent a nondisplaced fracture or be artifactual due to patient  positioning. Electronically Signed   By: Agustin Cree M.D.   On: 08/14/2022 08:38        Scheduled Meds:  acetaminophen  500 mg Oral Q6H   amLODipine  10 mg Oral Daily   atorvastatin  40 mg Oral Daily   docusate sodium  100 mg Oral BID   enoxaparin (LOVENOX) injection  40 mg Subcutaneous Q24H   feeding supplement  237 mL Oral BID BM   Continuous Infusions:  sodium chloride 50 mL/hr at 08/14/22 1752     LOS: 1 day    Time spent: 35 minutes    Brisa Auth Sherryll Burger, MD [facility] Pager [phone]  If 7PM-7AM, please contact night-coverage [email or web address] Password [email or web address] 08/15/2022, 11:02 AM

## 2022-08-15 NOTE — Evaluation (Signed)
Occupational Therapy Evaluation Patient Details Name: Madison Brown MRN: [id number] DOB: [birth year] Today's Date: 08/15/2022   History of Present Illness 89 y/o female presented to ED on 08/14/22 after fall with L hip/thigh pain. Imaging showed L greater trochanter fx along with hemorrhage within L glute med and quadratus femoris muscles. S/p ORIF L hip fx on 7/5. PMH: dementia, HTN, CVA, CAD s/p CABG, compression fx s/p kyphoplasty   Clinical Impression   Pt was seen for OT/PT evaluation this date. Prior to hospital admission, pt was obtaining assistance for dressing and bathing by family and aide who comes 2x per week. Pt lives at home with spouse who is unable to physically assist. Pt presents to acute OT demonstrating impaired ADL performance and functional mobility 2/2 pain, activity tolerance, and limited ROM (See OT problem list for additional functional deficits). Pt was willing to participate and pleasant throughout session. Pt currently requires MIN A +2 for transfer with RW in place. MIN A for bed mobility to maneuver to EOB. Pt would benefit from skilled OT services to address noted impairments and functional limitations (see below for any additional details) in order to maximize safety and independence while minimizing falls risk and caregiver burden.Anticipate the need for follow up OT services upon acute hospital DC.       Recommendations for follow up therapy are one component of a multi-disciplinary discharge planning process, led by the attending physician.  Recommendations may be updated based on patient status, additional functional criteria and insurance authorization.   Assistance Recommended at Discharge Intermittent Supervision/Assistance  Patient can return home with the following A little help with walking and/or transfers;A little help with bathing/dressing/bathroom;Assistance with cooking/housework;Direct supervision/assist for medications management;Assist for  transportation;Help with stairs or ramp for entrance    Functional Status Assessment  Patient has had a recent decline in their functional status and demonstrates the ability to make significant improvements in function in a reasonable and predictable amount of time.  Equipment Recommendations  None recommended by OT    Recommendations for Other Services       Precautions / Restrictions Precautions Precautions: Fall Restrictions Weight Bearing Restrictions: Yes LLE Weight Bearing: Weight bearing as tolerated      Mobility Bed Mobility Overal bed mobility: Needs Assistance Bed Mobility: Supine to Sit     Supine to sit: Min assist     General bed mobility comments: assist for repositioning hips towards EOB    Transfers Overall transfer level: Needs assistance Equipment used: Rolling walker (2 wheels) Transfers: Sit to/from Stand, Bed to chair/wheelchair/BSC Sit to Stand: Min assist, +2 physical assistance     Step pivot transfers: Min assist, +2 physical assistance            Balance Overall balance assessment: Needs assistance Sitting-balance support: No upper extremity supported, Feet supported Sitting balance-Leahy Scale: Fair     Standing balance support: Bilateral upper extremity supported, Reliant on assistive device for balance                               ADL either performed or assessed with clinical judgement   ADL Overall ADL's : Needs assistance/impaired Eating/Feeding: Moderate assistance;Sitting Eating/Feeding Details (indicate cue type and reason): Pt's daught feeding her in bed, report she was "babying her", anticipate Pt set up assist if sitting up in chair  Toilet Transfer: Minimal assistance;Stand-pivot;BSC/3in1;Rolling walker (2 wheels) Toilet Transfer Details (indicate cue type and reason): simulated         Functional mobility during ADLs: Minimal assistance;+2 for physical  assistance;Rollator (4 wheels) General ADL Comments: Pt's ability to perform ADLs is limited by pain and decrease activity tolerance.     Vision         Perception     Praxis      Pertinent Vitals/Pain Pain Assessment Pain Assessment: Faces Faces Pain Scale: Hurts little more Pain Location: L hip/thigh Pain Descriptors / Indicators: Grimacing, Guarding Pain Intervention(s): Monitored during session, Repositioned     Hand Dominance Right   Extremity/Trunk Assessment Upper Extremity Assessment Upper Extremity Assessment: Overall WFL for tasks assessed   Lower Extremity Assessment Lower Extremity Assessment: Defer to PT evaluation LLE Deficits / Details: grossly 3-/5 LLE: Unable to fully assess due to pain   Cervical / Trunk Assessment Cervical / Trunk Assessment: Kyphotic   Communication Communication Communication: No difficulties   Cognition Arousal/Alertness: Awake/alert Behavior During Therapy: WFL for tasks assessed/performed Overall Cognitive Status: History of cognitive impairments - at baseline                                 General Comments: very pleasant, willing to participate     General Comments       Exercises     Shoulder Instructions      Home Living Family/patient expects to be discharged to:: Private residence Living Arrangements: Spouse/significant other Available Help at Discharge: Family;Available PRN/intermittently Type of Home: House Home Access: Stairs to enter [employer] of Steps: 5 Entrance Stairs-Rails: Left Home Layout: One level     Bathroom Shower/Tub: Chief Strategy Officer: Handicapped height Bathroom Accessibility: Yes How Accessible: Accessible via walker Home Equipment: BSC/3in1;Rolling Walker (2 wheels);Cane - single point;Shower seat   Additional Comments: has aide 2x/week      Prior Functioning/Environment Prior Level of Function : Needs assist       Physical Assist  : Mobility (physical);ADLs (physical) Mobility (physical): Transfers ADLs (physical): Bathing;Dressing Mobility Comments: SBA/CGA with amb community distances with a RW, reports 2 falls in the last 6 months ADLs Comments: Assist from family with bathing and dressing        OT Problem List: Decreased range of motion;Decreased strength;Decreased activity tolerance;Impaired balance (sitting and/or standing);Decreased knowledge of use of DME or AE      OT Treatment/Interventions: Self-care/ADL training;Therapeutic exercise;Energy conservation;DME and/or AE instruction;Therapeutic activities;Patient/family education    OT Goals(Current goals can be found in the care plan section) Acute Rehab OT Goals Patient Stated Goal: To go home OT Goal Formulation: With patient Time For Goal Achievement: 08/29/22 Potential to Achieve Goals: Good ADL Goals Pt Will Perform Grooming: sitting;with set-up Pt Will Transfer to Toilet: with min guard assist;stand pivot transfer;bedside commode Pt Will Perform Toileting - Clothing Manipulation and hygiene: with min assist;sit to/from stand  OT Frequency: Min 1X/week    Co-evaluation PT/OT/SLP Co-Evaluation/Treatment: Yes Reason for Co-Treatment: Complexity of the patient's impairments (multi-system involvement);For patient/therapist safety;To address functional/ADL transfers   OT goals addressed during session: Proper use of Adaptive equipment and DME;Strengthening/ROM      AM-PAC OT "6 Clicks" Daily Activity     Outcome Measure Help from another person eating meals?: A Little Help from another person taking care of personal grooming?: A Little Help from another person toileting, which includes using toliet, bedpan,  or urinal?: A Little Help from another person bathing (including washing, rinsing, drying)?: A Lot Help from another person to put on and taking off regular upper body clothing?: A Little Help from another person to put on and taking off  regular lower body clothing?: A Lot 6 Click Score: 16   End of Session Equipment Utilized During Treatment: Rolling walker (2 wheels) Nurse Communication: Mobility status  Activity Tolerance: Patient tolerated treatment well Patient left: in chair;with call bell/phone within reach;with chair alarm set;with family/visitor present  OT Visit Diagnosis: Unsteadiness on feet (R26.81);Repeated falls (R29.6);Muscle weakness (generalized) (M62.81)                Time: 1610-9604 OT Time Calculation (min): 19 min Charges:  OT General Charges $OT Visit: 1 Visit OT Evaluation $OT Eval Moderate Complexity: 1 Mod [address] OTS

## 2022-08-16 DIAGNOSIS — S72002A Fracture of unspecified part of neck of left femur, initial encounter for closed fracture: Secondary | ICD-10-CM | POA: Diagnosis not present

## 2022-08-16 DIAGNOSIS — F03B0 Unspecified dementia, moderate, without behavioral disturbance, psychotic disturbance, mood disturbance, and anxiety: Secondary | ICD-10-CM | POA: Diagnosis not present

## 2022-08-16 DIAGNOSIS — I2510 Atherosclerotic heart disease of native coronary artery without angina pectoris: Secondary | ICD-10-CM | POA: Diagnosis not present

## 2022-08-16 DIAGNOSIS — I10 Essential (primary) hypertension: Secondary | ICD-10-CM | POA: Diagnosis not present

## 2022-08-16 MED ORDER — HYDROCODONE-ACETAMINOPHEN 5-325 MG PO TABS: 1.0000 | ORAL_TABLET | Freq: Four times a day (QID) | ORAL | Status: DC | PRN

## 2022-08-16 MED ORDER — MAGNESIUM HYDROXIDE 400 MG/5ML PO SUSP
30.0000 mL | Freq: Every day | ORAL | Status: DC
  Administered 2022-08-16 – 2022-08-17 (×2): 30 mL via ORAL
  Filled 2022-08-16 (×3): qty 30

## 2022-08-16 MED ORDER — POLYETHYLENE GLYCOL 3350 17 G PO PACK
17.0000 g | PACK | Freq: Every day | ORAL | Status: DC
  Administered 2022-08-17 – 2022-08-18 (×2): 17 g via ORAL
  Filled 2022-08-16 (×2): qty 1

## 2022-08-16 NOTE — [place] FL2 (Signed)
[place] MEDICAID FL2 LEVEL OF CARE FORM     IDENTIFICATION  Patient Name: Madison Brown Birthdate: 30-Dec-1929 Sex: female Admission Date (Current Location): 08/14/2022  [facility] and [place] Number:  Chiropodist and Address:  [facility], [address] [place], [place] [address]      Provider Number: [id number]  Attending Physician Name and Address:  Delfino Lovett, MD  Relative Name and Phone Number:  Milana Kidney (Daughter) [phone] [facility] Phone)    Current Level of Care: Hospital Recommended Level of Care: Skilled Nursing Facility Prior Approval Number: [id number]  Date Approved/Denied: 12/02/20 PASRR Number: [id number]  Discharge Plan:      Current Diagnoses: Patient Active Problem List   Diagnosis Date Noted   Closed left hip fracture (HCC) 08/14/2022   Closed right hip fracture, initial encounter [facility])    Fall    History of vertebral fracture    Femur fracture, right (HCC) 12/07/2021   Delirium 12/07/2021   Leukocytosis 12/07/2021   Urinary tract infection without hematuria    Protein-calorie malnutrition, severe (HCC) 12/04/2020   Hx of CABG 11/26/2020   S/P aortic valve replacement with bioprosthetic valve 11/26/2020   Dementia (HCC) 11/26/2020   TIA (transient ischemic attack) 11/25/2020   Acute CVA (cerebrovascular accident) (HCC) 11/25/2020   COVID-19 virus infection 11/25/2020   Slurred speech 11/25/2020   Underweight 02/23/2017   Essential hypertension, benign 03/30/2013   Other and unspecified hyperlipidemia 03/30/2013   Encounter for preventive health examination 10/06/2012   CAD (coronary artery disease) 06/22/2011    Orientation RESPIRATION BLADDER Height & Weight     Self, Place  Normal Continent (urgency/frequency) Weight: 39.9 kg Height:  5\' 2"  (157.5 cm)  BEHAVIORAL SYMPTOMS/MOOD NEUROLOGICAL BOWEL NUTRITION STATUS      Continent Diet  AMBULATORY STATUS COMMUNICATION OF NEEDS Skin   Extensive  Assist Verbally Surgical wounds                       Personal Care Assistance Level of Assistance  Bathing, Dressing, Total care Bathing Assistance: Maximum assistance   Dressing Assistance: Maximum assistance Total Care Assistance: Limited assistance   Functional Limitations Info  Sight Sight Info: Impaired        SPECIAL CARE FACTORS FREQUENCY  PT (By licensed PT), OT (By licensed OT)     PT Frequency: 5x/week OT Frequency: 5x/week            Contractures Contractures Info: Not present    Additional Factors Info  Code Status, Allergies Code Status Info: DNR Allergies Info: No Known Allergies           Current Medications (08/16/2022):  This is the current hospital active medication list Current Facility-Administered Medications  Medication Dose Route Frequency Provider Last Rate Last Admin   0.9 %  sodium chloride infusion   Intravenous Continuous Poggi, Excell Seltzer, MD 50 mL/hr at 08/14/22 1752 New Bag at 08/14/22 1752   acetaminophen (TYLENOL) tablet 325-650 mg  325-650 mg Oral Q6H PRN Poggi, Excell Seltzer, MD   650 mg at 08/16/22 1104   amLODipine (NORVASC) tablet 10 mg  10 mg Oral Daily Verdene Lennert, MD   10 mg at 08/16/22 1105   atorvastatin (LIPITOR) tablet 40 mg  40 mg Oral Daily Verdene Lennert, MD   40 mg at 08/16/22 1105   bisacodyl (DULCOLAX) suppository 10 mg  10 mg Rectal Daily PRN Poggi, Excell Seltzer, MD       clopidogrel (PLAVIX)  tablet 75 mg  75 mg Oral Daily Delfino Lovett, MD   75 mg at 08/16/22 1106   diphenhydrAMINE (BENADRYL) 12.5 MG/5ML elixir 12.5-25 mg  12.5-25 mg Oral Q4H PRN Poggi, Excell Seltzer, MD       docusate sodium (COLACE) capsule 100 mg  100 mg Oral BID Poggi, Excell Seltzer, MD   100 mg at 08/16/22 1105   enoxaparin (LOVENOX) injection 40 mg  40 mg Subcutaneous Q24H Poggi, Excell Seltzer, MD   40 mg at 08/16/22 1107   feeding supplement (ENSURE ENLIVE / ENSURE PLUS) liquid 237 mL  237 mL Oral BID BM Verdene Lennert, MD   237 mL at 08/16/22 1104    HYDROcodone-acetaminophen (NORCO/VICODIN) 5-325 MG per tablet 1 tablet  1 tablet Oral Q6H PRN Delfino Lovett, MD       magnesium hydroxide (MILK OF MAGNESIA) suspension 30 mL  30 mL Oral Daily Sherryll Burger, Vipul, MD       metoCLOPramide (REGLAN) tablet 5-10 mg  5-10 mg Oral Q8H PRN Poggi, Excell Seltzer, MD       Or   metoCLOPramide (REGLAN) injection 5-10 mg  5-10 mg Intravenous Q8H PRN Poggi, Excell Seltzer, MD       ondansetron (ZOFRAN) tablet 4 mg  4 mg Oral Q6H PRN Poggi, Excell Seltzer, MD       Or   ondansetron (ZOFRAN) injection 4 mg  4 mg Intravenous Q6H PRN Poggi, Excell Seltzer, MD       [START ON 08/17/2022] polyethylene glycol (MIRALAX / GLYCOLAX) packet 17 g  17 g Oral Daily Sherryll Burger, Vipul, MD       sodium phosphate (FLEET) 7-19 GM/118ML enema 1 enema  1 enema Rectal Once PRN Poggi, Excell Seltzer, MD         Discharge Medications: Please see discharge summary for a list of discharge medications.  Relevant Imaging Results:  Relevant Lab Results:   Additional Information SSN [id number]  Bing Quarry, RN

## 2022-08-16 NOTE — TOC Progression Note (Addendum)
Transition of Care [facility]) - Progression Note    Patient Details  Name: FREEDOM SALISBURY MRN: [id number] Date of Birth: 23-Nov-1929  Transition of Care [facility]) CM/SW Contact  Bing Quarry, RN Phone Number: 08/16/2022, 2:01 PM  Clinical Narrative:  PASRR # [id number] A on 12/02/2020. Was at [facility] in 2023. Will attempt to speak to patient as she is oriented today or spouse to start bed search.  Gabriel Cirri MSN RN CM  Transitions of Care Department [facility] [phone] Weekends Only   Update: Spoke with daughter, as patient was asleep, daughter said preference is to go back to [facility] as only choice at the moment. If declined a bed will look at alternatives then when asked for additional preferences. CSW sent via Hub to [facility].  Gabriel Cirri MSN RN CM  Transitions of Care Department [facility] [phone] Weekends Only        Expected Discharge Plan and Services                                               Social Determinants of Health (SDOH) Interventions SDOH Screenings   Food Insecurity: No Food Insecurity (08/14/2022)  Housing: Low Risk  (08/14/2022)  Transportation Needs: No Transportation Needs (08/14/2022)  Utilities: Not At Risk (08/14/2022)  Tobacco Use: Low Risk  (08/14/2022)    Readmission Risk Interventions     No data to display

## 2022-08-16 NOTE — Progress Notes (Signed)
Physical Therapy Treatment Patient Details Name: Madison Brown MRN: [id number] DOB: 1929-10-29 Today's Date: 08/16/2022   History of Present Illness 89 y/o female presented to ED on 08/14/22 after fall with L hip/thigh pain. Imaging showed L greater trochanter fx along with hemorrhage within L glute med and quadratus femoris muscles. S/p ORIF L hip fx on 7/5. PMH: dementia, HTN, CVA, CAD s/p CABG, compression fx s/p kyphoplasty    PT Comments  Pt ready for session, needing to void.  She does struggle some with bed mobility today needing increased assist due to positioning.  Cues for hand hold assist on rail for balance.  She is able to step pivot transfer bed -> BSC -> bed -> recliner with mod a x 1.  She does take good quality step during step pivot transfers.  Participated in exercises as described below.  She remains in recliner with needs met after session with family in room.    Assistance Recommended at Discharge Frequent or constant Supervision/Assistance  If plan is discharge home, recommend the following:  Can travel by private vehicle    A lot of help with walking and/or transfers;A little help with bathing/dressing/bathroom;Assistance with cooking/housework;Assist for transportation;Help with stairs or ramp for entrance      Equipment Recommendations       Recommendations for Other Services       Precautions / Restrictions Precautions Precautions: Fall Restrictions Weight Bearing Restrictions: Yes LLE Weight Bearing: Weight bearing as tolerated     Mobility  Bed Mobility Overal bed mobility: Needs Assistance Bed Mobility: Supine to Sit     Supine to sit: Mod assist     General bed mobility comments: increased assist due to bed positioning today    Transfers Overall transfer level: Needs assistance Equipment used: Rolling walker (2 wheels) Transfers: Sit to/from Stand Sit to Stand: Min assist, Mod assist   Step pivot transfers: Mod assist       General  transfer comment: +1 today for transfers and preicare in sitting.    Ambulation/Gait         Gait velocity: decreased     General Gait Details: no true gait but is able to take quality steps to chair with step pivot transfers   Stairs             Wheelchair Mobility     Tilt Bed    Modified Rankin (Stroke Patients Only)       Balance Overall balance assessment: Needs assistance Sitting-balance support: Feet supported, Single extremity supported Sitting balance-Leahy Scale: Fair     Standing balance support: Bilateral upper extremity supported Standing balance-Leahy Scale: Poor Standing balance comment: +1 hands on with assist for balance,                            Cognition Arousal/Alertness: Awake/alert Behavior During Therapy: WFL for tasks assessed/performed Overall Cognitive Status: History of cognitive impairments - at baseline                                 General Comments: very pleasant, willing to participate        Exercises Other Exercises Other Exercises: void on BSC Other Exercises: Seared A/AAROM BLE x 10    General Comments        Pertinent Vitals/Pain Pain Assessment Pain Assessment: Faces Faces Pain Scale: Hurts little more Pain Location: L hip/thigh  no pain at rest, increased with movement Pain Descriptors / Indicators: Grimacing, Guarding    Home Living                          Prior Function            PT Goals (current goals can now be found in the care plan section) Progress towards PT goals: Progressing toward goals    Frequency    7X/week      PT Plan Current plan remains appropriate    Co-evaluation              AM-PAC PT "6 Clicks" Mobility   Outcome Measure  Help needed turning from your back to your side while in a flat bed without using bedrails?: A Little Help needed moving from lying on your back to sitting on the side of a flat bed without using  bedrails?: A Little Help needed moving to and from a bed to a chair (including a wheelchair)?: A Lot Help needed standing up from a chair using your arms (e.g., wheelchair or bedside chair)?: A Lot Help needed to walk in hospital room?: Total Help needed climbing 3-5 steps with a railing? : Total 6 Click Score: 12    End of Session Equipment Utilized During Treatment: Gait belt Activity Tolerance: Patient tolerated treatment well Patient left: in chair;with call bell/phone within reach;with chair alarm set;with family/visitor present Nurse Communication: Mobility status PT Visit Diagnosis: Unsteadiness on feet (R26.81);Muscle weakness (generalized) (M62.81);History of falling (Z91.81);Difficulty in walking, not elsewhere classified (R26.2)     Time: 1610-9604 PT Time Calculation (min) (ACUTE ONLY): 17 min  Charges:    $Therapeutic Activity: 8-22 mins PT General Charges $$ ACUTE PT VISIT: 1 Visit                   Danielle Dess, PTA 08/16/22, 9:29 AM

## 2022-08-16 NOTE — Plan of Care (Signed)
  Problem: Activity: Goal: Ability to avoid complications of mobility impairment will improve Outcome: Progressing Goal: Ability to tolerate increased activity will improve Outcome: Progressing   Problem: Clinical Measurements: Goal: Postoperative complications will be avoided or minimized Outcome: Progressing   

## 2022-08-16 NOTE — Progress Notes (Signed)
PROGRESS NOTE    Madison Brown  ZOX:[id number] DOB: [birth year] DOA: 08/14/2022 PCP: Enid Baas, MD    Brief Narrative:   89 y.o. female with medical history significant of CAD s/p CABG (2009), compression fracture s/p kyphoplasty, moderate dementia, hypertension, CVA, carotid artery disease, hyperlipidemia admitted for closed left hip fracture status post fall  7/5: S/p intramedullary nail for nondisplaced intertrochanteric fracture of the left hip 7/6: PT and OT eval 7/7: Looking for SNF placement  Assessment & Plan:   Principal Problem:   Closed left hip fracture (HCC) Active Problems:   Dementia (HCC)   CAD (coronary artery disease)   Essential hypertension, benign  Nondisplaced intertrochanteric fracture of the left hip POD 2 status post intramedullary nail Pain management and DVT prophylaxis per Ortho Weightbearing as tolerated to the left leg PT and OT eval-recommends SNF.  TOC aware and looking for placement  Dementia [facility]) Patient has a history of moderate dementia with no behavioral disturbance.  She is currently at baseline. - Delirium precautions   CAD (coronary artery disease) Patient states she continues to have reproducible chest pain since her CABG in 2009.  No chest pain concerning for active cardiovascular disease. -Resumed Plavix - Continue home statin   Essential hypertension, benign - Continue amlodipine  DVT prophylaxis: Lovenox enoxaparin (LOVENOX) injection 40 mg Start: 08/15/22 0800 SCDs Start: 08/14/22 1737 Place TED hose Start: 08/14/22 1737     Code Status: DNR Family Communication: Daughter updated at bedside Disposition Plan: Possible discharge in 1 to 2 days depending on clinical condition and progress.  Medically stable SNF placement at discharge   Consultants:  Ortho  Procedures:  Intra medullary nail placement on 7/5  Antimicrobials: One-time dose of IV Ancef given for surgical prophylaxis on  7/5   Subjective:  She is feeling better.  No new complaints.  Daughter at bedside.  Objective: Vitals:   08/15/22 1649 08/15/22 2200 08/16/22 0049 08/16/22 0747  BP: (!) 117/59  (!) 153/67 (!) 142/68  Pulse: 78  100 84  Resp: 16  18 17   Temp: 98.2 F (36.8 C)  98.5 F (36.9 C) 98.3 F (36.8 C)  TempSrc:      SpO2: 93% 95% 96% 97%  Weight:      Height:        Intake/Output Summary (Last 24 hours) at 08/16/2022 1322 Last data filed at 08/15/2022 2120 Gross per 24 hour  Intake 605.25 ml  Output 150 ml  Net 455.25 ml    Filed Weights   08/14/22 0756 08/14/22 1416  Weight: 39.9 kg 39.9 kg    Examination:  General exam: Appears calm and comfortable  Respiratory system: Clear to auscultation. Respiratory effort normal. Cardiovascular system: S1 & S2 heard, RRR. No JVD, murmurs, rubs, gallops or clicks. No pedal edema. Gastrointestinal system: Abdomen is nondistended, soft and nontender. No organomegaly or masses felt. Normal bowel sounds heard. Central nervous system: Alert and oriented. No focal neurological deficits. Extremities/skin: Dressing intact.  Moving toes.  No drainage noted on the left hip honeycomb dressing Psychiatry: Judgement and insight appear normal. Mood & affect appropriate.     Data Reviewed: I have personally reviewed following labs and imaging studies  CBC: Recent Labs  Lab 08/14/22 0812 08/15/22 0415  WBC 9.9 10.7*  HGB 11.1* 9.7*  HCT 33.6* 30.1*  MCV 90.1 91.5  PLT 155 134*    Basic Metabolic Panel: Recent Labs  Lab 08/14/22 0812 08/15/22 0415  NA 141 138  K 3.4* 3.7  CL 106 104  CO2 26 26  GLUCOSE 129* 164*  BUN 16 14  CREATININE 0.98 1.02*  CALCIUM 9.1 8.5*     Recent Labs  Lab 08/14/22 0812  INR 1.1     Radiology Studies: DG HIP UNILAT WITH PELVIS 2-3 VIEWS LEFT  Result Date: 08/14/2022 CLINICAL DATA:  Elective surgery. Left femoral ORIF. Intraoperative fluoroscopy. EXAM: DG HIP (WITH OR WITHOUT PELVIS) 2-3V  LEFT COMPARISON:  Left hip radiographs 08/14/2022 and MRI left hip 08/14/2022 FINDINGS: Images were performed intraoperatively without the presence of a radiologist. Interval left femoral long cephalomedullary nail fixation of the previously seen distal intertrochanteric fracture. Total fluoroscopy images: 4 Total fluoroscopy time: 56 seconds Please see intraoperative findings for further detail. IMPRESSION: Intraoperative fluoroscopy for left femoral ORIF. Electronically Signed   By: Neita Garnet M.D.   On: 08/14/2022 16:52   DG C-Arm 1-60 Min-No Report  Result Date: 08/14/2022 Fluoroscopy was utilized by the requesting physician.  No radiographic interpretation.        Scheduled Meds:  amLODipine  10 mg Oral Daily   atorvastatin  40 mg Oral Daily   clopidogrel  75 mg Oral Daily   docusate sodium  100 mg Oral BID   enoxaparin (LOVENOX) injection  40 mg Subcutaneous Q24H   feeding supplement  237 mL Oral BID BM   magnesium hydroxide  30 mL Oral Daily   [START ON 08/17/2022] polyethylene glycol  17 g Oral Daily   Continuous Infusions:  sodium chloride 50 mL/hr at 08/14/22 1752     LOS: 2 days    Time spent: 35 minutes    Hazley Dezeeuw Sherryll Burger, MD [facility] Pager [phone]  If 7PM-7AM, please contact night-coverage [email or web address] Password [email or web address] 08/16/2022, 1:22 PM

## 2022-08-16 NOTE — Plan of Care (Signed)
  Problem: Activity: Goal: Ability to avoid complications of mobility impairment will improve Outcome: Progressing Goal: Ability to tolerate increased activity will improve Outcome: Progressing   Problem: Clinical Measurements: Goal: Postoperative complications will be avoided or minimized Outcome: Progressing   Problem: Skin Integrity: Goal: Will show signs of wound healing Outcome: Progressing

## 2022-08-16 NOTE — Progress Notes (Signed)
  Subjective: 2 Days Post-Op Procedure(s) (LRB): INTRAMEDULLARY (IM) NAIL INTERTROCHANTERIC (Left) Patient reports pain as mild.   Patient is well, and has had no acute complaints or problems PT and care management to assist with discharge planning.  Patient currently lives at home with her husband.  She will likely need SNF following discharge. Negative for chest pain and shortness of breath Fever: no Gastrointestinal:Negative for nausea and vomiting She reports that she is passing gas this morning.  Objective: Vital signs in last 24 hours: Temp:  [97.4 F (36.3 C)-98.7 F (37.1 C)] 98.3 F (36.8 C) (07/07 0747) Pulse Rate:  [78-100] 84 (07/07 0747) Resp:  [16-18] 17 (07/07 0747) BP: (117-153)/(59-69) 142/68 (07/07 0747) SpO2:  [93 %-97 %] 97 % (07/07 0747)  Intake/Output from previous day:  Intake/Output Summary (Last 24 hours) at 08/16/2022 0751 Last data filed at 08/15/2022 2120 Gross per 24 hour  Intake 845.25 ml  Output 150 ml  Net 695.25 ml    Intake/Output this shift: No intake/output data recorded.  Labs: Recent Labs    08/14/22 0812 08/15/22 0415  HGB 11.1* 9.7*   Recent Labs    08/14/22 0812 08/15/22 0415  WBC 9.9 10.7*  RBC 3.73* 3.29*  HCT 33.6* 30.1*  PLT 155 134*   Recent Labs    08/14/22 0812 08/15/22 0415  NA 141 138  K 3.4* 3.7  CL 106 104  CO2 26 26  BUN 16 14  CREATININE 0.98 1.02*  GLUCOSE 129* 164*  CALCIUM 9.1 8.5*   Recent Labs    08/14/22 0812  INR 1.1     EXAM General - Patient is Alert and Appropriate Extremity - ABD soft Neurovascular intact Dorsiflexion/Plantar flexion intact Incision: dressing C/D/I No cellulitis present Compartment soft Dressing/Incision - clean, dry, no drainage noted to the left hip honeycomb dressings Motor Function - intact, moving foot and toes well on exam.  Abdomen soft with intact bowel sounds this morning.  Past Medical History:  Diagnosis Date   Coronary artery disease    Dr.  Gwen Pounds    Assessment/Plan: 2 Days Post-Op Procedure(s) (LRB): INTRAMEDULLARY (IM) NAIL INTERTROCHANTERIC (Left) Principal Problem:   Closed left hip fracture (HCC) Active Problems:   CAD (coronary artery disease)   Essential hypertension, benign   Dementia (HCC)  Estimated body mass index is 16.09 kg/m as calculated from the following:   Height as of this encounter: 5\' 2"  (1.575 m).   Weight as of this encounter: 39.9 kg. Advance diet Up with therapy D/C IV fluids when tolerating po intake.  Labs pending this AM. Hg 9.7 yesterday morning. Work on [employer].  She is passing gas this morning. 2+ assist yesterday with PT, will likely need SNF following discharge.  DVT Prophylaxis - Lovenox, TED hose, and SCDs Weight-Bearing as tolerated to left leg  J. Horris Latino, PA-C [facility] 08/16/2022, 7:51 AM

## 2022-08-17 ENCOUNTER — Encounter: Payer: Self-pay | Admitting: Surgery

## 2022-08-17 DIAGNOSIS — I10 Essential (primary) hypertension: Secondary | ICD-10-CM | POA: Diagnosis not present

## 2022-08-17 DIAGNOSIS — F03B0 Unspecified dementia, moderate, without behavioral disturbance, psychotic disturbance, mood disturbance, and anxiety: Secondary | ICD-10-CM | POA: Diagnosis not present

## 2022-08-17 DIAGNOSIS — S72002A Fracture of unspecified part of neck of left femur, initial encounter for closed fracture: Secondary | ICD-10-CM | POA: Diagnosis not present

## 2022-08-17 MED ORDER — ENOXAPARIN SODIUM 40 MG/0.4ML IJ SOSY: 40.0000 mg | PREFILLED_SYRINGE | INTRAMUSCULAR | 0 refills | Status: DC

## 2022-08-17 MED ORDER — ENOXAPARIN SODIUM 30 MG/0.3ML IJ SOSY
30.0000 mg | PREFILLED_SYRINGE | INTRAMUSCULAR | Status: DC
  Administered 2022-08-18: 30 mg via SUBCUTANEOUS
  Filled 2022-08-17: qty 0.3

## 2022-08-17 MED ORDER — CLOPIDOGREL BISULFATE 75 MG PO TABS: 75.0000 mg | ORAL_TABLET | Freq: Every day | ORAL | 0 refills | Status: DC

## 2022-08-17 MED ORDER — HYDROCODONE-ACETAMINOPHEN 5-325 MG PO TABS: 1.0000 | ORAL_TABLET | Freq: Four times a day (QID) | ORAL | 0 refills | Status: AC | PRN

## 2022-08-17 NOTE — Progress Notes (Signed)
Physical Therapy Treatment Patient Details Name: Madison Brown MRN: [id number] DOB: 1929/08/31 Today's Date: 08/17/2022   History of Present Illness 89 y/o female presented to ED on 08/14/22 after fall with L hip/thigh pain. Imaging showed L greater trochanter fx along with hemorrhage within L glute med and quadratus femoris muscles. S/p ORIF L hip fx on 7/5. PMH: dementia, HTN, CVA, CAD s/p CABG, compression fx s/p kyphoplasty    PT Comments  In chair this am.  Stood x 3 from chair with mod a x 1.  Pre-gait activities focused on posture and weight shifting.  She generally requires min/mod a x 1 to remain standing but on third stand she is able to hold herself up briefly with contact guard assist.  She odes have a tendency to lean right in sitting and standing which daughter said is her baseline.  Participated in exercises as described below.     Assistance Recommended at Discharge Frequent or constant Supervision/Assistance  If plan is discharge home, recommend the following:  Can travel by private vehicle    A lot of help with walking and/or transfers;A little help with bathing/dressing/bathroom;Assistance with cooking/housework;Assist for transportation;Help with stairs or ramp for entrance   No  Equipment Recommendations       Recommendations for Other Services       Precautions / Restrictions Precautions Precautions: Fall Restrictions Weight Bearing Restrictions: Yes LLE Weight Bearing: Weight bearing as tolerated     Mobility  Bed Mobility               General bed mobility comments: in recliner before and after Patient Response: Cooperative  Transfers Overall transfer level: Needs assistance Equipment used: Rolling walker (2 wheels) Transfers: Sit to/from Stand Sit to Stand: Min assist, Mod assist                Ambulation/Gait         Gait velocity: decreased     General Gait Details: deferred as she is not ready to progress at this time.   focused on pre-gait activies   Stairs             Wheelchair Mobility     Tilt Bed Tilt Bed Patient Response: Cooperative  Modified Rankin (Stroke Patients Only)       Balance Overall balance assessment: Needs assistance Sitting-balance support: Feet supported, Single extremity supported Sitting balance-Leahy Scale: Fair   Postural control: Right lateral lean Standing balance support: Bilateral upper extremity supported Standing balance-Leahy Scale: Poor Standing balance comment: +1 hands on with assist for balance,                            Cognition Arousal/Alertness: Awake/alert Behavior During Therapy: WFL for tasks assessed/performed Overall Cognitive Status: History of cognitive impairments - at baseline                                          Exercises Other Exercises Other Exercises: Seated A/AAROM BLE x 10    General Comments        Pertinent Vitals/Pain Pain Assessment Pain Assessment: Faces Faces Pain Scale: Hurts little more Pain Descriptors / Indicators: Grimacing, Guarding Pain Intervention(s): Limited activity within patient's tolerance, Monitored during session, Repositioned    Home Living  Prior Function            PT Goals (current goals can now be found in the care plan section) Progress towards PT goals: Progressing toward goals    Frequency    7X/week      PT Plan Current plan remains appropriate    Co-evaluation              AM-PAC PT "6 Clicks" Mobility   Outcome Measure  Help needed turning from your back to your side while in a flat bed without using bedrails?: A Little Help needed moving from lying on your back to sitting on the side of a flat bed without using bedrails?: A Little Help needed moving to and from a bed to a chair (including a wheelchair)?: A Lot Help needed standing up from a chair using your arms (e.g., wheelchair or bedside  chair)?: A Lot Help needed to walk in hospital room?: Total Help needed climbing 3-5 steps with a railing? : Total 6 Click Score: 12    End of Session Equipment Utilized During Treatment: Gait belt Activity Tolerance: Patient tolerated treatment well Patient left: in chair;with call bell/phone within reach;with chair alarm set;with family/visitor present Nurse Communication: Mobility status PT Visit Diagnosis: Unsteadiness on feet (R26.81);Muscle weakness (generalized) (M62.81);History of falling (Z91.81);Difficulty in walking, not elsewhere classified (R26.2)     Time: 0981-1914 PT Time Calculation (min) (ACUTE ONLY): 24 min  Charges:    $Therapeutic Exercise: 8-22 mins $Therapeutic Activity: 8-22 mins PT General Charges $$ ACUTE PT VISIT: 1 Visit                   Danielle Dess, PTA 08/17/22, 9:48 AM

## 2022-08-17 NOTE — Care Management Important Message (Signed)
Important Message  Patient Details  Name: Madison Brown MRN: [id number] Date of Birth: 06/24/1929   Medicare Important Message Given:  N/A - LOS <3 / Initial given by admissions     Olegario Messier A Samer Dutton 08/17/2022, 9:19 AM

## 2022-08-17 NOTE — Progress Notes (Signed)
PROGRESS NOTE    Madison Brown  BJY:[id number] DOB: [birth year] DOA: 08/14/2022 PCP: Enid Baas, MD    Brief Narrative:   89 y.o. female with medical history significant of CAD s/p CABG (2009), compression fracture s/p kyphoplasty, moderate dementia, hypertension, CVA, carotid artery disease, hyperlipidemia admitted for closed left hip fracture status post fall  7/5: S/p intramedullary nail for nondisplaced intertrochanteric fracture of the left hip 7/6: PT and OT eval 7/7-7/8: Looking for SNF placement  Assessment & Plan:   Principal Problem:   Closed left hip fracture (HCC) Active Problems:   Dementia (HCC)   CAD (coronary artery disease)   Essential hypertension, benign  Nondisplaced intertrochanteric fracture of the left hip POD 3 status post intramedullary nail Pain management and DVT prophylaxis per Ortho Weightbearing as tolerated to the left leg PT and OT eval-recommends SNF.  TOC aware and looking for placement  Dementia [facility]) Patient has a history of moderate dementia with no behavioral disturbance.  She is currently at baseline. - Delirium precautions   CAD (coronary artery disease) Patient states she continues to have reproducible chest pain since her CABG in 2009.  No chest pain concerning for active cardiovascular disease. -Continue Plavix, statin   Essential hypertension, benign - Continue amlodipine  DVT prophylaxis: Lovenox enoxaparin (LOVENOX) injection 40 mg Start: 08/15/22 0800 SCDs Start: 08/14/22 1737 Place TED hose Start: 08/14/22 1737     Code Status: DNR Family Communication: Daughter updated at bedside Disposition Plan: Medically stable SNF placement at discharge   Consultants:  Ortho  Procedures:  Intra medullary nail placement on 7/5  Antimicrobials: One-time dose of IV Ancef given for surgical prophylaxis on 7/5   Subjective:  She is feeling better.  No new complaints.   Objective: Vitals:   08/16/22 0747 08/16/22  1437 08/16/22 2337 08/17/22 0759  BP: (!) 142/68 134/61 (!) 150/68 (!) 143/65  Pulse: 84 85 99 81  Resp: 17 17 20 16   Temp: 98.3 F (36.8 C) 98.4 F (36.9 C) 98.7 F (37.1 C) 98.1 F (36.7 C)  TempSrc:      SpO2: 97% 100% 96% 100%  Weight:      Height:        Intake/Output Summary (Last 24 hours) at 08/17/2022 1109 Last data filed at 08/17/2022 1033 Gross per 24 hour  Intake 240 ml  Output 0 ml  Net 240 ml    Filed Weights   08/14/22 0756 08/14/22 1416  Weight: 39.9 kg 39.9 kg    Examination:  General exam: Appears calm and comfortable  Respiratory system: Clear to auscultation. Respiratory effort normal. Cardiovascular system: S1 & S2 heard, RRR. No JVD, murmurs, rubs, gallops or clicks. No pedal edema. Gastrointestinal system: Abdomen is nondistended, soft and nontender. No organomegaly or masses felt. Normal bowel sounds heard. Central nervous system: Alert and oriented. No focal neurological deficits. Extremities/skin: Dressing intact.  Moving toes.  No drainage noted on the left hip honeycomb dressing Psychiatry: Judgement and insight appear normal. Mood & affect appropriate.     Data Reviewed: I have personally reviewed following labs and imaging studies  CBC: Recent Labs  Lab 08/14/22 0812 08/15/22 0415  WBC 9.9 10.7*  HGB 11.1* 9.7*  HCT 33.6* 30.1*  MCV 90.1 91.5  PLT 155 134*    Basic Metabolic Panel: Recent Labs  Lab 08/14/22 0812 08/15/22 0415  NA 141 138  K 3.4* 3.7  CL 106 104  CO2 26 26  GLUCOSE 129* 164*  BUN 16 14  CREATININE 0.98 1.02*  CALCIUM 9.1 8.5*     Recent Labs  Lab 08/14/22 0812  INR 1.1     Radiology Studies: No results found.      Scheduled Meds:  amLODipine  10 mg Oral Daily   atorvastatin  40 mg Oral Daily   clopidogrel  75 mg Oral Daily   docusate sodium  100 mg Oral BID   enoxaparin (LOVENOX) injection  40 mg Subcutaneous Q24H   feeding supplement  237 mL Oral BID BM   magnesium hydroxide  30 mL  Oral Daily   polyethylene glycol  17 g Oral Daily   Continuous Infusions:     LOS: 3 days    Time spent: 35 minutes    Delfino Lovett, MD [facility] Pager [phone]  If 7PM-7AM, please contact night-coverage [email or web address] Password [email or web address] 08/17/2022, 11:10 AM

## 2022-08-17 NOTE — Progress Notes (Signed)
  Subjective: 3 Days Post-Op Procedure(s) (LRB): INTRAMEDULLARY (IM) NAIL INTERTROCHANTERIC (Left) Patient reports pain as mild.   Patient is well, and has had no acute complaints or problems Plan is for d/c to SNF.  Patient has been to [facility] in the past, this is the family's first choice. Negative for chest pain and shortness of breath Fever: no Gastrointestinal:Negative for nausea and vomiting She reports that she is passing gas this morning.  No BM yet.  Objective: Vital signs in last 24 hours: Temp:  [98.3 F (36.8 C)-98.7 F (37.1 C)] 98.7 F (37.1 C) (07/07 2337) Pulse Rate:  [84-99] 99 (07/07 2337) Resp:  [17-20] 20 (07/07 2337) BP: (134-150)/(61-68) 150/68 (07/07 2337) SpO2:  [96 %-100 %] 96 % (07/07 2337)  Intake/Output from previous day:  Intake/Output Summary (Last 24 hours) at 08/17/2022 0744 Last data filed at 08/17/2022 0350 Gross per 24 hour  Intake --  Output 0 ml  Net 0 ml    Intake/Output this shift: No intake/output data recorded.  Labs: Recent Labs    08/14/22 0812 08/15/22 0415  HGB 11.1* 9.7*   Recent Labs    08/14/22 0812 08/15/22 0415  WBC 9.9 10.7*  RBC 3.73* 3.29*  HCT 33.6* 30.1*  PLT 155 134*   Recent Labs    08/14/22 0812 08/15/22 0415  NA 141 138  K 3.4* 3.7  CL 106 104  CO2 26 26  BUN 16 14  CREATININE 0.98 1.02*  GLUCOSE 129* 164*  CALCIUM 9.1 8.5*   Recent Labs    08/14/22 0812  INR 1.1     EXAM General - Patient is Alert and Appropriate Extremity - ABD soft Neurovascular intact Dorsiflexion/Plantar flexion intact Incision: dressing C/D/I No cellulitis present Compartment soft Dressing/Incision - clean, dry, no drainage noted to the left hip honeycomb dressings Motor Function - intact, moving foot and toes well on exam.  Abdomen soft with intact bowel sounds this morning.  Past Medical History:  Diagnosis Date   Coronary artery disease    Dr. Gwen Pounds    Assessment/Plan: 3 Days Post-Op  Procedure(s) (LRB): INTRAMEDULLARY (IM) NAIL INTERTROCHANTERIC (Left) Principal Problem:   Closed left hip fracture (HCC) Active Problems:   CAD (coronary artery disease)   Essential hypertension, benign   Dementia (HCC)  Estimated body mass index is 16.09 kg/m as calculated from the following:   Height as of this encounter: 5\' 2"  (1.575 m).   Weight as of this encounter: 39.9 kg. Advance diet Up with therapy D/C IV fluids when tolerating po intake.  Vitals stable this AM, BP 150/68. Work on [employer].  She is passing gas this morning. Plan is for d/c to SNF.  Following discharge, staples can be removed by SNF on 08/28/22.  Follow-up with [facility] orthopaedics in 6 weeks for x-rays of the left femur. Continue Lovenox 40mg  daily.  Can hold Plavix until completion of Lovenox following discharge.  DVT Prophylaxis - Lovenox, TED hose, and SCDs Weight-Bearing as tolerated to left leg  J. Horris Latino, PA-C [facility] Orth[facility] 08/17/2022, 7:44 AM

## 2022-08-17 NOTE — TOC Progression Note (Addendum)
Transition of Care [facility]) - Progression Note    Patient Details  Name: Madison Brown MRN: [id number] Date of Birth: Aug 09, 1929  [facility]) CM/SW Contact  Marlowe Sax, RN Phone Number: 08/17/2022, 12:08 PM  Clinical Narrative:    Spoke to daughter Donnis and reviewed the bed offer from [facility], they prefer [facility] and accepted the bed offer, I notified Tiffany at [facility], Ins pending,Ref [id number]  [facility] stated that they will have a bed tomorroiw        Expected Discharge Plan and Services                                               Social Determinants of Health (SDOH) Interventions SDOH Screenings   Food Insecurity: No Food Insecurity (08/14/2022)  Housing: Low Risk  (08/14/2022)  Transportation Needs: No Transportation Needs (08/14/2022)  Utilities: Not At Risk (08/14/2022)  Tobacco Use: Low Risk  (08/17/2022)    Readmission Risk Interventions     No data to display

## 2022-08-17 NOTE — Plan of Care (Signed)
  Problem: Activity: Goal: Ability to avoid complications of mobility impairment will improve Outcome: Progressing Goal: Ability to tolerate increased activity will improve Outcome: Progressing   Problem: Clinical Measurements: Goal: Postoperative complications will be avoided or minimized Outcome: Progressing   Problem: Pain Management: Goal: Pain level will decrease with appropriate interventions Outcome: Progressing   Problem: Skin Integrity: Goal: Will show signs of wound healing Outcome: Progressing   

## 2022-08-17 NOTE — Plan of Care (Signed)
  Problem: Activity: Goal: Ability to avoid complications of mobility impairment will improve Outcome: Progressing Goal: Ability to tolerate increased activity will improve Outcome: Progressing   Problem: Clinical Measurements: Goal: Postoperative complications will be avoided or minimized Outcome: Progressing   Problem: Skin Integrity: Goal: Will show signs of wound healing Outcome: Progressing   

## 2022-08-17 NOTE — Progress Notes (Signed)
Occupational Therapy Treatment Patient Details Name: Madison Brown MRN: [id number] DOB: 06-11-1929 Today's Date: 08/17/2022   History of present illness 89 y/o female presented to ED on 08/14/22 after fall with L hip/thigh pain. Imaging showed L greater trochanter fx along with hemorrhage within L glute med and quadratus femoris muscles. S/p ORIF L hip fx on 7/5. PMH: dementia, HTN, CVA, CAD s/p CABG, compression fx s/p kyphoplasty   OT comments  Pt seen for OT treatment on this date. Upon arrival to room pt seated in recliner, agreeable to tx. Pt requires CGA for upper body bathing. MOD A for lower body bathing. MOD A for sit<>stand and transfer. Pt provided verbal cues for safety as pt began to sit before in position. MOD A for bed mobility 2/2 hip discomfort. Pt demonstrated good effort throughout session, with good tolerance. Pt making good progress toward goals, will continue to follow POC. Discharge recommendation remains appropriate.    Recommendations for follow up therapy are one component of a multi-disciplinary discharge planning process, led by the attending physician.  Recommendations may be updated based on patient status, additional functional criteria and insurance authorization.    Assistance Recommended at Discharge Intermittent Supervision/Assistance  Patient can return home with the following  A little help with walking and/or transfers;A little help with bathing/dressing/bathroom;Assistance with cooking/housework;Direct supervision/assist for medications management;Assist for transportation;Help with stairs or ramp for entrance   Equipment Recommendations  None recommended by OT    Recommendations for Other Services      Precautions / Restrictions Precautions Precautions: Fall Restrictions Weight Bearing Restrictions: Yes LLE Weight Bearing: Weight bearing as tolerated       Mobility Bed Mobility Overal bed mobility: Needs Assistance Bed Mobility: Sit to Supine        Sit to supine: Mod assist   General bed mobility comments: MOD A for returning to bed 2/2 hip discomfort.    Transfers Overall transfer level: Needs assistance Equipment used: Rolling walker (2 wheels) Transfers: Bed to chair/wheelchair/BSC, Sit to/from Stand Sit to Stand: Mod assist     Step pivot transfers: Mod assist     General transfer comment: Sit<>stand during bathing and pericare, Step transfer to bed from recliner with RW in place. Pt required cueing to avoid sitting prematurely.     Balance Overall balance assessment: Needs assistance Sitting-balance support: Feet supported, No upper extremity supported Sitting balance-Leahy Scale: Fair     Standing balance support: Bilateral upper extremity supported Standing balance-Leahy Scale: Poor                             ADL either performed or assessed with clinical judgement   ADL Overall ADL's : Needs assistance/impaired     Grooming: Wash/dry face;Brushing hair;Minimal assistance;Sitting   Upper Body Bathing: Set up   Lower Body Bathing: Sitting/lateral leans;Moderate assistance   Upper Body Dressing : Supervision/safety;Sitting Upper Body Dressing Details (indicate cue type and reason): Donning gown     Toilet Transfer: Stand-pivot;BSC/3in1;Rolling walker (2 wheels);Moderate assistance Toilet Transfer Details (indicate cue type and reason): simulated Toileting- Clothing Manipulation and Hygiene: Moderate assistance;Sit to/from stand       Functional mobility during ADLs: Moderate assistance;Rolling walker (2 wheels) General ADL Comments: Pt's ability to perform ADLs is limited by pain and decreased activity tolerance.    Extremity/Trunk Assessment Upper Extremity Assessment Upper Extremity Assessment: Overall WFL for tasks assessed   Lower Extremity Assessment Lower Extremity Assessment: Overall WFL for  tasks assessed        Vision       Perception     Praxis       Cognition Arousal/Alertness: Awake/alert Behavior During Therapy: WFL for tasks assessed/performed Overall Cognitive Status: History of cognitive impairments - at baseline                                 General Comments: Able to follow direction, willing to participate in offered activities.        Exercises      Shoulder Instructions       General Comments      Pertinent Vitals/ Pain       Pain Assessment Pain Assessment: Faces Faces Pain Scale: Hurts little more Pain Descriptors / Indicators: Grimacing, Guarding Pain Intervention(s): Limited activity within patient's tolerance  Home Living                                          Prior Functioning/Environment              Frequency  Min 1X/week        Progress Toward Goals  OT Goals(current goals can now be found in the care plan section)  Progress towards OT goals: Progressing toward goals  Acute Rehab OT Goals Patient Stated Goal: To go home OT Goal Formulation: With patient Time For Goal Achievement: 08/29/22 Potential to Achieve Goals: Good ADL Goals Pt Will Perform Grooming: sitting;with set-up Pt Will Transfer to Toilet: with min guard assist;stand pivot transfer;bedside commode Pt Will Perform Toileting - Clothing Manipulation and hygiene: with min assist;sit to/from stand  Plan Discharge plan remains appropriate    Co-evaluation                 AM-PAC OT "6 Clicks" Daily Activity     Outcome Measure   Help from another person eating meals?: A Little Help from another person taking care of personal grooming?: A Little Help from another person toileting, which includes using toliet, bedpan, or urinal?: A Little Help from another person bathing (including washing, rinsing, drying)?: A Lot Help from another person to put on and taking off regular upper body clothing?: A Little Help from another person to put on and taking off regular lower body  clothing?: A Lot 6 Click Score: 16    End of Session Equipment Utilized During Treatment: Rolling walker (2 wheels)  OT Visit Diagnosis: Unsteadiness on feet (R26.81);Repeated falls (R29.6);Muscle weakness (generalized) (M62.81)   Activity Tolerance Patient tolerated treatment well   Patient Left in bed;with call bell/phone within reach;with bed alarm set;with family/visitor present   Nurse Communication          Time: 4782-9562 OT Time Calculation (min): 26 min  Charges: OT General Charges $OT Visit: 1 Visit OT Treatments $Self Care/Home Management : 23-37 mins  [address] OTS   Emme Rosenau 08/17/2022, 12:03 PM

## 2022-08-17 NOTE — Discharge Instructions (Signed)
Diet: As you were doing prior to hospitalization   Shower:  May shower but keep the wounds dry, use an occlusive plastic wrap, NO SOAKING IN TUB.  If the bandage gets wet, change with a clean dry gauze.  Dressing:  You may change your dressing as needed. Change the dressing with sterile gauze dressing.    Staples can be removed by SNF on 08/28/22  Activity:  Increase activity slowly as tolerated, but follow the weight bearing instructions below.  No lifting or driving for 6 weeks.  Weight Bearing:   Weight bearing as tolerated to left lower extremity  Continue Lovenox for 14 days following discharge.  After completion of the Lovenox, return to daily Plavix.  To prevent constipation: you may use a stool softener such as -  Colace (over the counter) 100 mg by mouth twice a day  Drink plenty of fluids (prune juice may be helpful) and high fiber foods Miralax (over the counter) for constipation as needed.    Itching:  If you experience itching with your medications, try taking only a single pain pill, or even half a pain pill at a time.  You may take up to 10 pain pills per day, and you can also use benadryl over the counter for itching or also to help with sleep.   Precautions:  If you experience chest pain or shortness of breath - call 911 immediately for transfer to the hospital emergency department!!  If you develop a fever greater that 101 F, purulent drainage from wound, increased redness or drainage from wound, or calf pain-Call [facility]                                              Follow- Up Appointment:  Please call for an appointment to be seen in 2 weeks at [facility]

## 2022-08-18 DIAGNOSIS — S72002A Fracture of unspecified part of neck of left femur, initial encounter for closed fracture: Secondary | ICD-10-CM | POA: Diagnosis not present

## 2022-08-18 DIAGNOSIS — I10 Essential (primary) hypertension: Secondary | ICD-10-CM | POA: Diagnosis not present

## 2022-08-18 DIAGNOSIS — F03B0 Unspecified dementia, moderate, without behavioral disturbance, psychotic disturbance, mood disturbance, and anxiety: Secondary | ICD-10-CM | POA: Diagnosis not present

## 2022-08-18 DIAGNOSIS — I2510 Atherosclerotic heart disease of native coronary artery without angina pectoris: Secondary | ICD-10-CM | POA: Diagnosis not present

## 2022-08-18 MED ORDER — ENOXAPARIN SODIUM 30 MG/0.3ML IJ SOSY: 30.0000 mg | PREFILLED_SYRINGE | INTRAMUSCULAR | 0 refills | Status: AC

## 2022-08-18 MED ORDER — ENOXAPARIN SODIUM 30 MG/0.3ML IJ SOSY: 30.0000 mg | PREFILLED_SYRINGE | INTRAMUSCULAR | 0 refills | Status: DC

## 2022-08-18 MED ORDER — CLOPIDOGREL BISULFATE 75 MG PO TABS: 75.0000 mg | ORAL_TABLET | Freq: Every day | ORAL | Status: AC

## 2022-08-18 NOTE — Discharge Summary (Signed)
Physician Discharge Summary   Patient: Madison Brown MRN: [id number] DOB: 1929-04-10  Admit date:     08/14/2022  Discharge date: 08/18/22  Discharge Physician: Delfino Lovett   PCP: Enid Baas, MD   Recommendations at discharge:    F/up with outpt providers as requested  Discharge Diagnoses: Principal Problem:   Closed left hip fracture [facility]) Active Problems:   Dementia (HCC)   CAD (coronary artery disease)   Essential hypertension, benign  Hospital Course: Assessment and Plan:  89 y.o. female with medical history significant of CAD s/p CABG (2009), compression fracture s/p kyphoplasty, moderate dementia, hypertension, CVA, carotid artery disease, hyperlipidemia admitted for closed left hip fracture status post fall   7/5: S/p intramedullary nail for nondisplaced intertrochanteric fracture of the left hip 7/6: PT and OT eval 7/7-7/8: Looking for SNF placement   Assessment & Plan:   Principal Problem:   Closed left hip fracture (HCC) Active Problems:   Dementia (HCC)   CAD (coronary artery disease)   Essential hypertension, benign   Nondisplaced intertrochanteric fracture of the left hip POD 4 status post intramedullary nail Pain management and DVT prophylaxis per Ortho Weightbearing as tolerated to the left leg PT and OT eval-recommends SNF.  Going to [facility] today   Dementia [facility]) Patient has a history of moderate dementia with no behavioral disturbance.  She is currently at baseline. - Delirium precautions   CAD (coronary artery disease) Patient states she continues to have reproducible chest pain since her CABG in 2009.  No chest pain concerning for active cardiovascular disease. -Continue Plavix, statin   Essential hypertension, benign - Continue amlodipine       Consultants: Ortho Procedures performed: status post intramedullary nail on 7/5  Disposition: Skilled nursing facility Diet recommendation:  Discharge Diet Orders (From  admission, onward)     Start     Ordered   08/18/22 0000  Diet - low sodium heart healthy        08/18/22 0922           Carb modified diet DISCHARGE MEDICATION: Allergies as of 08/18/2022   No Known Allergies      Medication List     STOP taking these medications    aspirin EC 325 MG tablet   methocarbamol 500 MG tablet Commonly known as: ROBAXIN   multivitamin with minerals tablet   oxyCODONE 5 MG immediate release tablet Commonly known as: Oxy IR/ROXICODONE   sodium phosphate 7-19 GM/118ML Enem   traMADol 50 MG tablet Commonly known as: ULTRAM       TAKE these medications    acetaminophen 500 MG tablet Commonly known as: TYLENOL Take 2 tablets (1,000 mg total) by mouth every 8 (eight) hours.   amLODipine 10 MG tablet Commonly known as: NORVASC Take 10 mg by mouth daily. What changed: Another medication with the same name was removed. Continue taking this medication, and follow the directions you see here.   atorvastatin 40 MG tablet Commonly known as: LIPITOR Take 40 mg by mouth daily.   clopidogrel 75 MG tablet Commonly known as: PLAVIX Take 1 tablet (75 mg total) by mouth daily.   enoxaparin 30 MG/0.3ML injection Commonly known as: LOVENOX Inject 0.3 mLs (30 mg total) into the skin daily.   feeding supplement Liqd Take 237 mLs by mouth 2 (two) times daily between meals.   HYDROcodone-acetaminophen 5-325 MG tablet Commonly known as: NORCO/VICODIN Take 1 tablet by mouth every 6 (six) hours as needed for moderate pain or severe  pain (pain score 4-6).        Contact information for follow-up providers     Anson Oregon, PA-C Follow up in 6 week(s).   Specialty: Physician Assistant Why: X-rays of the left femur. Contact information: [address] [place] [address] [phone]              Contact information for after-discharge care     Destination     [facility] Preferred SNF .   Service: Skilled Nursing Contact information: [address] [place] [address] [phone]                    Discharge Exam: Ceasar Mons Weights   08/14/22 0756 08/14/22 1416  Weight: 39.9 kg 39.9 kg   General exam: Appears calm and comfortable  Respiratory system: Clear to auscultation. Respiratory effort normal. Cardiovascular system: S1 & S2 heard, RRR. No JVD, murmurs, rubs, gallops or clicks. No pedal edema. Gastrointestinal system: Abdomen is nondistended, soft and nontender. No organomegaly or masses felt. Normal bowel sounds heard. Central nervous system: Alert and oriented. No focal neurological deficits. Extremities/skin: Dressing intact.  Moving toes.  No drainage noted on the left hip honeycomb dressing Psychiatry: Judgement and insight appear normal. Mood & affect appropriate.     Condition at discharge: good  The results of significant diagnostics from this hospitalization (including imaging, microbiology, ancillary and laboratory) are listed below for reference.   Imaging Studies: DG HIP UNILAT WITH PELVIS 2-3 VIEWS LEFT  Result Date: 08/14/2022 CLINICAL DATA:  Elective surgery. Left femoral ORIF. Intraoperative fluoroscopy. EXAM: DG HIP (WITH OR WITHOUT PELVIS) 2-3V LEFT COMPARISON:  Left hip radiographs 08/14/2022 and MRI left hip 08/14/2022 FINDINGS: Images were performed intraoperatively without the presence of a radiologist. Interval left femoral long cephalomedullary nail fixation of the previously seen distal intertrochanteric fracture. Total fluoroscopy images: 4 Total fluoroscopy time: 56 seconds Please see intraoperative findings for further detail. IMPRESSION: Intraoperative fluoroscopy for left femoral ORIF. Electronically Signed   By: Neita Garnet M.D.   On: 08/14/2022 16:52   DG C-Arm 1-60 Min-No Report  Result Date: 08/14/2022 Fluoroscopy was utilized by the requesting physician.   No radiographic interpretation.   MR HIP LEFT WO CONTRAST  Result Date: 08/14/2022 CLINICAL DATA:  Left hip and thigh pain after falling last night. Hip fracture suspected. EXAM: MR OF THE LEFT HIP WITHOUT CONTRAST TECHNIQUE: Multiplanar, multisequence MR imaging was performed. No intravenous contrast was administered. COMPARISON:  Radiographs 08/14/2022 and 12/07/2021.  CT 12/01/2021. FINDINGS: Bones: Acute nondisplaced intertrochanteric left femur fracture. Edema is greatest within the greater trochanter, although extends into the posterior intertrochanteric region and proximal diaphysis. The left femoral head appears normal. No evidence of dislocation or femoral head osteonecrosis. Previous proximal right femoral ORIF. Articular cartilage and labrum Articular cartilage: Mild left hip degenerative changes with mild subchondral cyst formation laterally in the acetabulum. Labrum: Left acetabular labral degeneration. Joint or bursal effusion Joint effusion: No significant hip joint effusion. Bursae: Ill-defined soft tissue hemorrhage posterior and medial to the proximal left femur without focal bursal fluid collection. Muscles and tendons Muscles and tendons: There is hemorrhage within the left gluteus medius and quadratus femoris muscles. No evidence of tendon tear. No significant focal muscular atrophy identified. Other findings Miscellaneous: Mild subcutaneous edema posterolateral to the left hip. Underlying sigmoid diverticulosis noted. IMPRESSION: 1. Acute nondisplaced intertrochanteric left femur fracture. 2. Associated hemorrhage within the left  gluteus medius and quadratus femoris muscles. 3. Mild left hip degenerative changes. Electronically Signed   By: Carey Bullocks M.D.   On: 08/14/2022 11:04   DG Knee 2 Views Left  Result Date: 08/14/2022 CLINICAL DATA:  fall, pain over L hip EXAM: LEFT KNEE - 1-2 VIEW COMPARISON:  None Available. FINDINGS: Osteopenia. No fracture, dislocation, or effusion. No  significant osseous degenerative change. Patchy arterial calcifications. IMPRESSION: No acute findings. Osteopenia. Electronically Signed   By: Corlis Leak M.D.   On: 08/14/2022 08:43   CT Head Wo Contrast  Result Date: 08/14/2022 CLINICAL DATA:  Facial trauma, blunt; Neck trauma (Age >= 65y) EXAM: CT HEAD WITHOUT CONTRAST CT CERVICAL SPINE WITHOUT CONTRAST TECHNIQUE: Multidetector CT imaging of the head and cervical spine was performed following the standard protocol without intravenous contrast. Multiplanar CT image reconstructions of the cervical spine were also generated. RADIATION DOSE REDUCTION: This exam was performed according to the departmental dose-optimization program which includes automated exposure control, adjustment of the mA and/or kV according to patient size and/or use of iterative reconstruction technique. COMPARISON:  CT head and C Spine 12/07/21 FINDINGS: CT HEAD FINDINGS Brain: No evidence of acute infarction, hemorrhage, hydrocephalus, extra-axial collection or mass lesion/mass effect. Redemonstrated are chronic bilateral occipital lobe and bilateral cerebellar infarcts. There is sequela of mild-to-moderate chronic microvascular ischemic change. Generalized volume loss. Vascular: No hyperdense vessel or unexpected calcification. Skull: Normal. Negative for fracture or focal lesion. Sinuses/Orbits: No middle ear or mastoid effusion. Mucosal thickening left maxillary sinus. Bilateral lens replacement. Orbits are otherwise unremarkable. Other: None CT CERVICAL SPINE FINDINGS Alignment: Grade 1 anterolisthesis of C2 on C3 and trace retrolisthesis of C3 on C4. Skull base and vertebrae: No acute fracture. No primary bone lesion or focal pathologic process. Soft tissues and spinal canal: No prevertebral fluid or swelling. No visible canal hematoma. Disc levels:  No evidence of high-grade spinal canal stenosis. Upper chest: Negative. Other: None IMPRESSION: 1. No acute intracranial abnormality.  Chronic bilateral occipital lobe and bilateral cerebellar infarcts. 2. No acute fracture or traumatic malalignment of the cervical spine. Electronically Signed   By: Lorenza Cambridge M.D.   On: 08/14/2022 08:41   CT Cervical Spine Wo Contrast  Result Date: 08/14/2022 CLINICAL DATA:  Facial trauma, blunt; Neck trauma (Age >= 65y) EXAM: CT HEAD WITHOUT CONTRAST CT CERVICAL SPINE WITHOUT CONTRAST TECHNIQUE: Multidetector CT imaging of the head and cervical spine was performed following the standard protocol without intravenous contrast. Multiplanar CT image reconstructions of the cervical spine were also generated. RADIATION DOSE REDUCTION: This exam was performed according to the departmental dose-optimization program which includes automated exposure control, adjustment of the mA and/or kV according to patient size and/or use of iterative reconstruction technique. COMPARISON:  CT head and C Spine 12/07/21 FINDINGS: CT HEAD FINDINGS Brain: No evidence of acute infarction, hemorrhage, hydrocephalus, extra-axial collection or mass lesion/mass effect. Redemonstrated are chronic bilateral occipital lobe and bilateral cerebellar infarcts. There is sequela of mild-to-moderate chronic microvascular ischemic change. Generalized volume loss. Vascular: No hyperdense vessel or unexpected calcification. Skull: Normal. Negative for fracture or focal lesion. Sinuses/Orbits: No middle ear or mastoid effusion. Mucosal thickening left maxillary sinus. Bilateral lens replacement. Orbits are otherwise unremarkable. Other: None CT CERVICAL SPINE FINDINGS Alignment: Grade 1 anterolisthesis of C2 on C3 and trace retrolisthesis of C3 on C4. Skull base and vertebrae: No acute fracture. No primary bone lesion or focal pathologic process. Soft tissues and spinal canal: No prevertebral fluid or swelling. No visible canal hematoma.  Disc levels:  No evidence of high-grade spinal canal stenosis. Upper chest: Negative. Other: None IMPRESSION: 1. No  acute intracranial abnormality. Chronic bilateral occipital lobe and bilateral cerebellar infarcts. 2. No acute fracture or traumatic malalignment of the cervical spine. Electronically Signed   By: Lorenza Cambridge M.D.   On: 08/14/2022 08:41   DG Hip Unilat W or Wo Pelvis 2-3 Views Left  Result Date: 08/14/2022 CLINICAL DATA:  Fall with left hip/thigh pain EXAM: DG HIP (WITH OR WITHOUT PELVIS) 3V LEFT COMPARISON:  Left hip radiographs dated 12/07/2021 FINDINGS: Prior right femoral fixation hardware appears intact. Subtle cortical irregularity along the proximal aspect of the left greater trochanter. IMPRESSION: Subtle cortical irregularity along the proximal aspect of the left greater trochanter, which may represent a nondisplaced fracture or be artifactual due to patient positioning. Electronically Signed   By: Agustin Cree M.D.   On: 08/14/2022 08:38    Microbiology: Results for orders placed or performed during the hospital encounter of 12/07/21  Culture, blood (Routine X 2) w Reflex to ID Panel     Status: None   Collection Time: 12/08/21 12:15 AM   Specimen: BLOOD  Result Value Ref Range Status   Specimen Description BLOOD BLOOD RIGHT ARM  Final   Special Requests   Final    BOTTLES DRAWN AEROBIC AND ANAEROBIC Blood Culture adequate volume   Culture   Final    NO GROWTH 5 DAYS Performed at [facility], [address], [place], [place] [address]    Report Status 12/13/2021 FINAL  Final  Culture, blood (Routine X 2) w Reflex to ID Panel     Status: None   Collection Time: 12/08/21 12:15 AM   Specimen: BLOOD  Result Value Ref Range Status   Specimen Description BLOOD BLOOD RIGHT HAND  Final   Special Requests   Final    BOTTLES DRAWN AEROBIC AND ANAEROBIC Blood Culture adequate volume   Culture   Final    NO GROWTH 5 DAYS Performed at [facility], [address], [place], [place] [address]    Report Status 12/13/2021 FINAL  Final  MRSA Next Gen by PCR, Nasal      Status: None   Collection Time: 12/08/21  1:55 AM   Specimen: Nasal Mucosa; Nasal Swab  Result Value Ref Range Status   MRSA by PCR Next Gen NOT DETECTED NOT DETECTED Final    Comment: (NOTE) The GeneXpert MRSA Assay (FDA approved for NASAL specimens only), is one component of a comprehensive MRSA colonization surveillance program. It is not intended to diagnose MRSA infection nor to guide or monitor treatment for MRSA infections. Test performance is not FDA approved in patients less than 72 years old. Performed at [facility], [address] [place], [place] [address]     Labs: CBC: Recent Labs  Lab 08/14/22 [phone] 08/15/22 0415  WBC 9.9 10.7*  HGB 11.1* 9.7*  HCT 33.6* 30.1*  MCV 90.1 91.5  PLT 155 134*   Basic Metabolic Panel: Recent Labs  Lab 08/14/22 0812 08/15/22 0415  NA 141 138  K 3.4* 3.7  CL 106 104  CO2 26 26  GLUCOSE 129* 164*  BUN 16 14  CREATININE 0.98 1.02*  CALCIUM 9.1 8.5*   Liver Function Tests: No results for input(s): "AST", "ALT", "ALKPHOS", "BILITOT", "PROT", "ALBUMIN" in the last 168 hours. CBG: No results for input(s): "GLUCAP" in the last 168 hours.  Discharge time spent: greater than 30 minutes.  Signed: Tanyah Debruyne  Sherryll Burger, MD [facility] 08/18/2022

## 2022-08-18 NOTE — TOC Progression Note (Signed)
Transition of Care [facility]) - Progression Note    Patient Details  Name: Madison Brown MRN: [id number] Date of Birth: 1929-03-14  Transition of Care [facility]) CM/SW Contact  Marlowe Sax, RN Phone Number: 08/18/2022, 9:07 AM  Clinical Narrative:     Ins approved to go to [facility] today  Expected Discharge Plan: Skilled Nursing Facility Barriers to Discharge: English as a second language teacher  Expected Discharge Plan and Services                                               Social Determinants of Health (SDOH) Interventions SDOH Screenings   Food Insecurity: No Food Insecurity (08/14/2022)  Housing: Low Risk  (08/14/2022)  Transportation Needs: No Transportation Needs (08/14/2022)  Utilities: Not At Risk (08/14/2022)  Tobacco Use: Low Risk  (08/17/2022)    Readmission Risk Interventions     No data to display

## 2022-08-18 NOTE — Progress Notes (Signed)
  Subjective: 4 Days Post-Op Procedure(s) (LRB): INTRAMEDULLARY (IM) NAIL INTERTROCHANTERIC (Left) Patient reports pain as mild.   Patient is well, and has had no acute complaints or problems Plan is for d/c to SNF.  Family has accepted bed offer at [facility]. Negative for chest pain and shortness of breath Fever: no Gastrointestinal:Negative for nausea and vomiting She reports that she is passing gas this morning.  She has had a BM.  Objective: Vital signs in last 24 hours: Temp:  [98.1 F (36.7 C)] 98.1 F (36.7 C) (07/08 2317) Pulse Rate:  [81-90] 89 (07/08 2317) Resp:  [16] 16 (07/08 2317) BP: (130-143)/(62-71) 139/62 (07/08 2317) SpO2:  [94 %-100 %] 94 % (07/08 2317)  Intake/Output from previous day:  Intake/Output Summary (Last 24 hours) at 08/18/2022 0726 Last data filed at 08/17/2022 1033 Gross per 24 hour  Intake 240 ml  Output --  Net 240 ml    Intake/Output this shift: No intake/output data recorded.  Labs: No results for input(s): "HGB" in the last 72 hours.  No results for input(s): "WBC", "RBC", "HCT", "PLT" in the last 72 hours.  No results for input(s): "NA", "K", "CL", "CO2", "BUN", "CREATININE", "GLUCOSE", "CALCIUM" in the last 72 hours.  No results for input(s): "LABPT", "INR" in the last 72 hours.    EXAM General - Patient is Alert and Appropriate Extremity - ABD soft Neurovascular intact Dorsiflexion/Plantar flexion intact Incision: dressing C/D/I No cellulitis present Compartment soft Dressing/Incision - clean, dry, no drainage noted to the left hip honeycomb dressings Motor Function - intact, moving foot and toes well on exam.  Abdomen soft with intact bowel sounds this morning.  Past Medical History:  Diagnosis Date   Coronary artery disease    Dr. Gwen Pounds    Assessment/Plan: 4 Days Post-Op Procedure(s) (LRB): INTRAMEDULLARY (IM) NAIL INTERTROCHANTERIC (Left) Principal Problem:   Closed left hip fracture (HCC) Active  Problems:   CAD (coronary artery disease)   Essential hypertension, benign   Dementia (HCC)  Estimated body mass index is 16.09 kg/m as calculated from the following:   Height as of this encounter: 5\' 2"  (1.575 m).   Weight as of this encounter: 39.9 kg. Advance diet Up with therapy D/C IV fluids when tolerating po intake.  Vitals stable this AM, BP 139/62. She has had a BM. Plan is for d/c to SNF, bed offer has been accepted at [facility].  Following discharge, staples can be removed by SNF on 08/28/22.  Follow-up with [facility] orthopaedics in 6 weeks for x-rays of the left femur. Continue Lovenox 30mg  daily for 14 days.  Can hold Plavix until completion of Lovenox following discharge.  DVT Prophylaxis - Lovenox, TED hose, and SCDs Weight-Bearing as tolerated to left leg  J. Horris Latino, PA-C [facility] 08/18/2022, 7:26 AM

## 2022-08-18 NOTE — TOC Progression Note (Signed)
Transition of Care [facility]) - Progression Note    Patient Details  Name: Madison Brown MRN: [id number] Date of Birth: 12/16/1929  Transition of Care [facility]) CM/SW Contact  Marlowe Sax, RN Phone Number: 08/18/2022, 10:16 AM  Clinical Narrative:     Patient to go to room 513 at [facility], [facility] called to transport, Family in the room and aware  Expected Discharge Plan: Skilled Nursing Facility Barriers to Discharge: Insurance Authorization  Expected Discharge Plan and Services         Expected Discharge Date: 08/17/22                                     Social Determinants of Health (SDOH) Interventions SDOH Screenings   Food Insecurity: No Food Insecurity (08/14/2022)  Housing: Low Risk  (08/14/2022)  Transportation Needs: No Transportation Needs (08/14/2022)  Utilities: Not At Risk (08/14/2022)  Tobacco Use: Low Risk  (08/17/2022)    Readmission Risk Interventions     No data to display

## 2022-08-18 NOTE — Care Management Important Message (Signed)
Important Message  Patient Details  Name: Madison Brown MRN: [id number] Date of Birth: [birth year]   Medicare Important Message Given:  Yes     Olegario Messier A Jamaar Howes 08/18/2022, 10:38 AM

## 2022-08-18 NOTE — Plan of Care (Signed)
Patient discharged per MD orders at this time.All dc instructions,education and medications reviewed with the patient and daughter.Pt expressed understanding and will comply with dc instructions.Follow up appointments was also communicated to the patient.no verbal c/o or any ssx of distress.Pt was discharged to the [facility] and rehabilitation facility.report was called to staff nurse Amy before transport.Pt was transported by [facility] on a stretcher.

## 2022-09-22 ENCOUNTER — Emergency Department: Payer: Medicare Other

## 2022-09-22 ENCOUNTER — Emergency Department
Admission: EM | Admit: 2022-09-22 | Discharge: 2022-09-22 | Disposition: A | Discharge status: Home / Self Care | Payer: Medicare Other | Attending: Emergency Medicine | Admitting: Emergency Medicine

## 2022-09-22 ENCOUNTER — Other Ambulatory Visit: Payer: Self-pay

## 2022-09-22 DIAGNOSIS — I10 Essential (primary) hypertension: Secondary | ICD-10-CM | POA: Insufficient documentation

## 2022-09-22 DIAGNOSIS — S0101XA Laceration without foreign body of scalp, initial encounter: Secondary | ICD-10-CM | POA: Insufficient documentation

## 2022-09-22 DIAGNOSIS — S0990XA Unspecified injury of head, initial encounter: Secondary | ICD-10-CM | POA: Diagnosis present

## 2022-09-22 DIAGNOSIS — Z7902 Long term (current) use of antithrombotics/antiplatelets: Secondary | ICD-10-CM | POA: Diagnosis not present

## 2022-09-22 DIAGNOSIS — Y92129 Unspecified place in nursing home as the place of occurrence of the external cause: Secondary | ICD-10-CM | POA: Insufficient documentation

## 2022-09-22 DIAGNOSIS — I2510 Atherosclerotic heart disease of native coronary artery without angina pectoris: Secondary | ICD-10-CM | POA: Insufficient documentation

## 2022-09-22 DIAGNOSIS — W01198A Fall on same level from slipping, tripping and stumbling with subsequent striking against other object, initial encounter: Secondary | ICD-10-CM | POA: Insufficient documentation

## 2022-09-22 DIAGNOSIS — W19XXXA Unspecified fall, initial encounter: Secondary | ICD-10-CM

## 2022-09-22 NOTE — Discharge Instructions (Signed)
You were seen in the emergency department today for your fall and head injury.  CT imaging was reassuring with no acute injuries.  You do have 1 staple on the right side of your scalp which will need to be removed in 7 to 10 days.  This can be done at your nursing facility or with the primary care provider.  Please return for any worsening or new symptoms.

## 2022-09-22 NOTE — ED Provider Notes (Signed)
[facility] Provider Note    Event Date/Time   First MD Initiated Contact with Patient 09/22/22 [phone]     (approximate)   History   Fall   HPI Madison Brown is a 89 y.o. female with mild dementia, CAD, HTN, CVA, history of prior CVA, on Plavix who presents today for fall.  Patient was at her living facility when she was up getting around with her walker.  She states the walker slid too far away from her and she fell down hitting her head.  Bleeding noted to the right side of her head.  She denied loss of consciousness.  She denied chest pain, palpitations, shortness of breath.  She denies pain on any of her extremities.  She denies any neck pain.  Last tetanus shot administered in 2022 does not need updated at this time.     Physical Exam   Triage Vital Signs: ED Triage Vitals  Encounter Vitals Group     BP      Systolic BP Percentile      Diastolic BP Percentile      Pulse      Resp      Temp      Temp src      SpO2      Weight      Height      Head Circumference      Peak Flow      Pain Score      Pain Loc      Pain Education      Exclude from Growth Chart     Most recent vital signs: Vitals:   09/22/22 0848 09/22/22 0919  BP: (!) 156/69 (!) 162/71  Pulse: 81 78  Resp: 18 18  Temp: 97.8 F (36.6 C)   SpO2:  100%    Physical Exam: I have reviewed the vital signs and nursing notes. General: Awake, alert, no acute distress.  Nontoxic appearing. Head: 1 cm laceration to right parietal scalp that is currently hemostatic with some dried blood in the hair. ENT:  EOM intact, PERRL. Oral mucosa is pink and moist with no lesions. Neck: Neck is supple with full range of motion, No meningeal signs. Cardiovascular:  RRR, No murmurs. Peripheral pulses palpable and equal bilaterally. Respiratory:  Symmetrical chest wall expansion.  No rhonchi, rales, or wheezes.  Good air movement throughout.  No use of accessory muscles.   Musculoskeletal:  No  cyanosis or edema. Moving extremities with full ROM.  Nontender to palpation throughout all extremities, chest wall, C, T, or L-spine. Abdomen:  Soft, nontender, nondistended. Neuro:  GCS 15, moving all four extremities, interacting appropriately. Speech clear. Psych:  Calm, appropriate.   Skin:  Warm, dry, no rash.  No bruising noted throughout trunk or extremities.   ED Results / Procedures / Treatments   Labs (all labs ordered are listed, but only abnormal results are displayed) Labs Reviewed - No data to display   EKG My EKG interpretation at 854: Rate of 77, sinus rhythm, left bundle branch block.  No acute ST elevations or depressions.  Overall comparable to EKG done on 08/14/2022.   RADIOLOGY Independently viewed and interpreted CT head and CT cervical spine without acute traumatic abnormalities.   PROCEDURES:  Critical Care performed: No  ..Laceration Repair  Date/Time: 09/22/2022 9:24 AM  Performed by: Janith Lima, MD Authorized by: Janith Lima, MD   Consent:    Consent obtained:  Verbal  Consent given by:  Patient   Risks, benefits, and alternatives were discussed: yes     Risks discussed:  Infection, pain and poor cosmetic result Universal protocol:    Patient identity confirmed:  Arm band Anesthesia:    Anesthesia method:  None Laceration details:    Location:  Scalp   Scalp location:  R parietal   Length (cm):  1   Depth (mm):  2 Pre-procedure details:    Preparation:  Patient was prepped and draped in usual sterile fashion and imaging obtained to evaluate for foreign bodies Exploration:    Hemostasis achieved with:  Direct pressure   Imaging outcome: foreign body not noted     Wound exploration: wound explored through full range of motion     Contaminated: no   Treatment:    Area cleansed with:  Chlorhexidine   Amount of cleaning:  Standard   Irrigation solution:  Sterile water   Debridement:  Minimal   Undermining:  None Skin repair:     Repair method:  Staples   Number of staples:  1 Approximation:    Approximation:  Close Repair type:    Repair type:  Simple Post-procedure details:    Dressing:  Open (no dressing)   Procedure completion:  Tolerated well, no immediate complications    MEDICATIONS ORDERED IN ED: Medications - No data to display   IMPRESSION / MDM / ASSESSMENT AND PLAN / ED COURSE  I reviewed the triage vital signs and the nursing notes.                              Differential diagnosis includes, but is not limited to, scalp laceration, intraparenchymal hemorrhage, extracranial hematoma, cervical spine injury.  Patient's presentation is most consistent with acute presentation with potential threat to life or bodily function.  Patient is a 89 year old female presenting today for mechanical ground-level fall without loss of consciousness with head injury.  CT head and CT cervical spine were obtained with no acute traumatic abnormalities.  Scalp laceration was repaired with 1 staple which will need to be removed in 7 to 10 days.  Otherwise, patient at baseline with no other pain complaints.  Spoke with family members at bedside who wish for patient to go back to facility.  Patient also wishes to go home at this time.  Her tetanus shot is updated.  She is safe for discharge and will follow-up for staple removal outpatient.  Clinical Course as of 09/22/22 0958  Tue Sep 22, 2022  0914 CT Head Wo Contrast Per my interpretation: No acute intracranial hemorrhage.  Chronic atrophy and prior stroke observed. [DW]  [phone] CT Cervical Spine Wo Contrast Per my interpretation: No acute cervical spine injury [DW]    Clinical Course User Index [DW] Janith Lima, MD     FINAL CLINICAL IMPRESSION(S) / ED DIAGNOSES   Final diagnoses:  Laceration of scalp, initial encounter  Fall, initial encounter     Rx / DC Orders   ED Discharge Orders     None        Note:  This document was prepared using  Dragon voice recognition software and may include unintentional dictation errors.   Janith Lima, MD 09/22/22 [phone]

## 2022-09-22 NOTE — ED Triage Notes (Signed)
Pt comes by EMS from [facility] after a fall. Pt was getting up using her walker and her walker fell out from under her. She fell and hit right side of her head. Unknown time how long pt was on the floor. When EMS arrived pressure was applied to the laceration on the right side of the head. A&Ox4. Pt is on Blood thinners.

## 2022-09-22 NOTE — ED Notes (Signed)
Went over d/c paperwork at this time with patient and daughters. Pt/daughters had no questions, comments or concerns after review and verbally understood them.  [facility] [place] was called at d/c time to let them know pt left with daughter and daughter is transporting pt back to facility. Also told the [facility] the d/c paperwork is with the daughter.

## 2024-02-03 ENCOUNTER — Emergency Department
Admission: EM | Admit: 2024-02-03 | Discharge: 2024-02-03 | Disposition: A | Discharge status: Home / Self Care | Attending: Emergency Medicine | Admitting: Emergency Medicine

## 2024-02-03 ENCOUNTER — Other Ambulatory Visit: Payer: Self-pay

## 2024-02-03 ENCOUNTER — Emergency Department

## 2024-02-03 DIAGNOSIS — F0390 Unspecified dementia without behavioral disturbance: Secondary | ICD-10-CM | POA: Insufficient documentation

## 2024-02-03 DIAGNOSIS — S0083XA Contusion of other part of head, initial encounter: Secondary | ICD-10-CM | POA: Insufficient documentation

## 2024-02-03 DIAGNOSIS — W19XXXA Unspecified fall, initial encounter: Secondary | ICD-10-CM | POA: Insufficient documentation

## 2024-02-03 DIAGNOSIS — S0990XA Unspecified injury of head, initial encounter: Secondary | ICD-10-CM | POA: Diagnosis present

## 2024-02-03 NOTE — ED Notes (Signed)
 Attempted to call report to facility x2, no answer no line to leave messages

## 2024-02-03 NOTE — ED Notes (Signed)
Patient to CT SCAN

## 2024-02-03 NOTE — Discharge Instructions (Signed)

## 2024-02-03 NOTE — ED Notes (Signed)
 Patient discharge back to [facility] awaiting transport

## 2024-02-03 NOTE — ED Notes (Signed)
 Waiting on [facility] to pick up patient

## 2024-02-03 NOTE — ED Notes (Signed)
 Patient to and returned from CT SCAN

## 2024-02-03 NOTE — ED Triage Notes (Signed)
 Patient had unwitnessed fall OOB, found on floor, abrasion to forehead on Plavix , oriented to baseline, dementia

## 2024-02-03 NOTE — ED Provider Notes (Signed)
 "  [facility] Provider Note    Event Date/Time   First MD Initiated Contact with Patient 02/03/24 0010     (approximate)   History   Fall (Patient had unwitnessed fall OOB, found on floor, abrasion to forehead on Plavix , oriented to baseline, dementia)  Level 5 caveat:  history/ROS limited by chronic dementia  HPI Madison Brown is a 89 y.o. female who was found at her facility on the floor with an abrasion on her forehead.  She reportedly takes Plavix .  She is reportedly at her baseline.  She is awake and agitated and not wanting to be here and demanding to be allowed to go home immediately.  She says that she has no pain anywhere and she does not remember what happened.     Physical Exam   Triage Vital Signs: ED Triage Vitals  Encounter Vitals Group     BP 02/03/24 0016 (!) 162/74     Girls Systolic BP Percentile --      Girls Diastolic BP Percentile --      Boys Systolic BP Percentile --      Boys Diastolic BP Percentile --      Pulse Rate 02/03/24 0016 70     Resp 02/03/24 0016 16     Temp 02/03/24 0016 98.6 F (37 C)     Temp Source 02/03/24 0016 Oral     SpO2 02/03/24 0016 100 %     Weight 02/03/24 0026 41 kg (90 lb 6.2 oz)     Height 02/03/24 0026 1.575 m (5' 2)     Head Circumference --      Peak Flow --      Pain Score 02/03/24 0026 0     Pain Loc --      Pain Education --      Exclude from Growth Chart --     Most recent vital signs: Vitals:   02/03/24 0145 02/03/24 0239  BP:  (!) 172/88  Pulse: 79 79  Resp: 16 18  Temp:  98.5 F (36.9 C)  SpO2: 98% 99%    General: Awake, alert, conversant.  Quick to irritation but pleasant enough with me.  Adamantly denies any pain and demands to go home. CV:  Good peripheral perfusion.  Regular rate and rhythm, normal heart sounds. Resp:  Normal effort. Speaking easily and comfortably, no accessory muscle usage nor intercostal retractions.  Lungs are clear to auscultation  bilaterally. Abd:  No distention.  No tenderness to palpation of the abdomen. Other:  She has a contusion with a very superficial abrasion to the right side of her forehead.  No other visible signs of trauma.  No tenderness to palpation of the cervical spine.  No tenderness or pain with range of motion of her arms and her legs and she is aggravated when I attempt to passively range her limbs and pulls them away from me and tells me to stop doing that.   ED Results / Procedures / Treatments   Labs (all labs ordered are listed, but only abnormal results are displayed) Labs Reviewed - No data to display    RADIOLOGY See ED course for details   PROCEDURES:  Critical Care performed: No  Procedures    IMPRESSION / MDM / ASSESSMENT AND PLAN / ED COURSE  I reviewed the triage vital signs and the nursing notes.  Differential diagnosis includes, but is not limited to, intracranial hemorrhage, cervical spine injury, contusion, abrasion  Patient's presentation is most consistent with acute presentation with potential threat to life or bodily function.  Labs/studies ordered: CT head, CT cervical spine  Interventions/Medications given:  Medications - No data to display  (Note:  hospital course my include additional interventions and/or labs/studies not listed above.)   Patient has a slight contusion to her forehead with a little abrasion, but no pain or other signs or symptoms of injury or infection.  I will rule out intracranial hemorrhage and cervical spine injury with CT scans, but if those are normal, anticipate discharge for outpatient follow-up.  Patient comes with DNR paperwork and I put that note in the computer as well.     Clinical Course as of 02/03/24 0357  Thu Feb 03, 2024  0357 I independtly viewed and interpreted the patient's head CT and cervical spine CT, and I also reviewed the radiologist's report(s).  No evidence of acute  intracranial hemorrhage or cervical spine injury.  Moving ahead with discharge as planned. [CF]    Clinical Course User Index [CF] Gordan Huxley, MD     FINAL CLINICAL IMPRESSION(S) / ED DIAGNOSES   Final diagnoses:  Fall, initial encounter  Contusion of forehead, initial encounter     Rx / DC Orders   ED Discharge Orders     None        Note:  This document was prepared using Dragon voice recognition software and may include unintentional dictation errors.   Gordan Huxley, MD 02/03/24 [phone]  "
